# Patient Record
Sex: Female | Born: 1990 | Race: Black or African American | Hispanic: No | Marital: Single | Smoking: Never smoker
Health system: Southern US, Community
[De-identification: ages and names within clinical notes are randomized; demographics above are authoritative.]

## PROBLEM LIST (undated history)

## (undated) ENCOUNTER — Inpatient Hospital Stay (HOSPITAL_COMMUNITY): Payer: Self-pay

## (undated) DIAGNOSIS — F329 Major depressive disorder, single episode, unspecified: Secondary | ICD-10-CM

## (undated) DIAGNOSIS — J302 Other seasonal allergic rhinitis: Secondary | ICD-10-CM

## (undated) DIAGNOSIS — O139 Gestational [pregnancy-induced] hypertension without significant proteinuria, unspecified trimester: Secondary | ICD-10-CM

## (undated) DIAGNOSIS — F32A Depression, unspecified: Secondary | ICD-10-CM

## (undated) DIAGNOSIS — B009 Herpesviral infection, unspecified: Secondary | ICD-10-CM

## (undated) DIAGNOSIS — F419 Anxiety disorder, unspecified: Secondary | ICD-10-CM

## (undated) DIAGNOSIS — N96 Recurrent pregnancy loss: Secondary | ICD-10-CM

## (undated) DIAGNOSIS — Z6791 Unspecified blood type, Rh negative: Secondary | ICD-10-CM

## (undated) DIAGNOSIS — A749 Chlamydial infection, unspecified: Secondary | ICD-10-CM

## (undated) DIAGNOSIS — O09299 Supervision of pregnancy with other poor reproductive or obstetric history, unspecified trimester: Secondary | ICD-10-CM

## (undated) DIAGNOSIS — O26899 Other specified pregnancy related conditions, unspecified trimester: Secondary | ICD-10-CM

## (undated) HISTORY — DX: Unspecified blood type, rh negative: Z67.91

## (undated) HISTORY — DX: Other specified pregnancy related conditions, unspecified trimester: O26.899

## (undated) HISTORY — PX: IUD REMOVAL: SHX5392

## (undated) HISTORY — DX: Recurrent pregnancy loss: N96

## (undated) HISTORY — DX: Supervision of pregnancy with other poor reproductive or obstetric history, unspecified trimester: O09.299

---
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
## 2012-05-22 ENCOUNTER — Ambulatory Visit (INDEPENDENT_AMBULATORY_CARE_PROVIDER_SITE_OTHER): Payer: Managed Care, Other (non HMO) | Admitting: Family Medicine

## 2012-05-22 ENCOUNTER — Other Ambulatory Visit (HOSPITAL_COMMUNITY)
Admission: RE | Admit: 2012-05-22 | Discharge: 2012-05-22 | Disposition: A | Discharge status: Home / Self Care | Payer: Managed Care, Other (non HMO) | Source: Ambulatory Visit | Attending: Family Medicine | Admitting: Family Medicine

## 2012-05-22 ENCOUNTER — Encounter: Payer: Self-pay | Admitting: Family Medicine

## 2012-05-22 VITALS — BP 126/82 | HR 80 | Wt 173.0 lb

## 2012-05-22 DIAGNOSIS — A499 Bacterial infection, unspecified: Secondary | ICD-10-CM

## 2012-05-22 DIAGNOSIS — N898 Other specified noninflammatory disorders of vagina: Secondary | ICD-10-CM

## 2012-05-22 DIAGNOSIS — Z113 Encounter for screening for infections with a predominantly sexual mode of transmission: Secondary | ICD-10-CM | POA: Insufficient documentation

## 2012-05-22 DIAGNOSIS — B9689 Other specified bacterial agents as the cause of diseases classified elsewhere: Secondary | ICD-10-CM

## 2012-05-22 DIAGNOSIS — N760 Acute vaginitis: Secondary | ICD-10-CM

## 2012-05-22 LAB — HIV ANTIBODY (ROUTINE TESTING W REFLEX): HIV: NONREACTIVE

## 2012-05-22 LAB — POCT WET PREP (WET MOUNT)

## 2012-05-22 LAB — RPR

## 2012-05-22 MED ORDER — METRONIDAZOLE 0.75 % VA GEL: 1.0000 | Freq: Every day | VAGINAL | Status: AC

## 2012-05-22 NOTE — Progress Notes (Signed)
Subjective:     Patient ID: Julia Potter, female   DOB: 11/07/1990, 22 y.o.   MRN: [id number]  HPI Vagina discharge:This has been going on for a month,she has been using monistat which cleared discharge cleared for 2 wks and then discharged returned few days ago.No burning,there itching,no changes in urine.Recently changed partner,previous yeast infection. HIV test done 1 yr ago was negative.Hx of chlamydia about 4 yrs ago.  Past Medical History  Diagnosis Date  . ALLERGIC RHINITIS       Review of Systems  Respiratory: Negative.   Cardiovascular: Negative.   Gastrointestinal: Negative.   Genitourinary: Positive for vaginal discharge. Negative for dysuria, urgency, hematuria, vaginal bleeding, enuresis, genital sores and menstrual problem.  All other systems reviewed and are negative.   Filed Vitals:   05/22/12 0833  BP: 126/82  Pulse: 80  Weight: 173 lb (78.472 kg)       Objective:   Physical Exam  Nursing note and vitals reviewed. Constitutional: She appears well-developed. No distress.  Cardiovascular: Normal rate, regular rhythm and normal heart sounds.   No murmur heard. Pulmonary/Chest: Effort normal and breath sounds normal. No respiratory distress. She has no wheezes. She exhibits no tenderness.  Abdominal: Soft. Bowel sounds are normal. She exhibits no distension and no mass. There is no tenderness.  Genitourinary: Uterus normal. Pelvic exam was performed with patient supine. Cervix exhibits discharge. Cervix exhibits no motion tenderness. Right adnexum displays no mass and no tenderness. Left adnexum displays no mass and no tenderness. No tenderness around the vagina. Vaginal discharge found.         Assessment:     Vaginitis (BV): Wet prep positive clue cells and whiff STD screening     Plan:     1. Metrogel prescribed.     F/U prn.  2. STD counseling done.     GC/Chlamydia,HIV and RPR checked today.     I will call her with test result.

## 2012-05-22 NOTE — Patient Instructions (Signed)
Sexually Transmitted Disease  Sexually transmitted disease (STD) refers to any infection that is passed from person to person during sexual activity. This may happen by way of saliva, semen, blood, vaginal mucus, or urine. Common STDs include:   Gonorrhea.   Chlamydia.   Syphilis.   HIV/AIDS.   Genital herpes.   Hepatitis B and C.   Trichomonas.   Human papillomavirus (HPV).   Pubic lice.  CAUSES   An STD may be spread by bacteria, virus, or parasite. A person can get an STD by:   Sexual intercourse with an infected person.   Sharing sex toys with an infected person.   Sharing needles with an infected person.   Having intimate contact with the genitals, mouth, or rectal areas of an infected person.  SYMPTOMS   Some people may not have any symptoms, but they can still pass the infection to others. Different STDs have different symptoms. Symptoms include:   Painful or bloody urination.   Pain in the pelvis, abdomen, vagina, anus, throat, or eyes.   Skin rash, itching, irritation, growths, or sores (lesions). These usually occur in the genital or anal area.   Abnormal vaginal discharge.   Penile discharge in men.   Soft, flesh-colored skin growths in the genital or anal area.   Fever.   Pain or bleeding during sexual intercourse.   Swollen glands in the groin area.   Yellow skin and eyes (jaundice). This is seen with hepatitis.  DIAGNOSIS   To make a diagnosis, your caregiver may:   Take a medical history.   Perform a physical exam.   Take a specimen (culture) to be examined.   Examine a sample of discharge under a microscope.   Perform blood tests.   Perform a Pap test, if this applies.   Perform a colposcopy.   Perform a laparoscopy.  TREATMENT    Chlamydia, gonorrhea, trichomonas, and syphilis can be cured with antibiotic medicine.   Genital herpes, hepatitis, and HIV can be treated, but not cured, with prescribed medicines. The medicines will lessen the symptoms.   Genital warts  from HPV can be treated with medicine or by freezing, burning (electrocautery), or surgery. Warts may come back.   HPV is a virus and cannot be cured with medicine or surgery.However, abnormal areas may be followed very closely by your caregiver and may be removed from the cervix, vagina, or vulva through office procedures or surgery.  If your diagnosis is confirmed, your recent sexual partners need treatment. This is true even if they are symptom-free or have a negative culture or evaluation. They should not have sex until their caregiver says it is okay.  HOME CARE INSTRUCTIONS   All sexual partners should be informed, tested, and treated for all STDs.   Take your antibiotics as directed. Finish them even if you start to feel better.   Only take over-the-counter or prescription medicines for pain, discomfort, or fever as directed by your caregiver.   Rest.   Eat a balanced diet and drink enough fluids to keep your urine clear or pale yellow.   Do not have sex until treatment is completed and you have followed up with your caregiver. STDs should be checked after treatment.   Keep all follow-up appointments, Pap tests, and blood tests as directed by your caregiver.   Only use latex condoms and water-soluble lubricants during sexual activity. Do not use petroleum jelly or oils.   Avoid alcohol and illegal drugs.   Get vaccinated   for HPV and hepatitis. If you have not received these vaccines in the past, talk to your caregiver about whether one or both might be right for you.   Avoid risky sex practices that can break the skin.  The only way to avoid getting an STD is to avoid all sexual activity.Latex condoms and dental dams (for oral sex) will help lessen the risk of getting an STD, but will not completely eliminate the risk.  SEEK MEDICAL CARE IF:    You have a fever.   You have any new or worsening symptoms.  Document Released: 04/09/2002 Document Revised: 04/11/2011 Document Reviewed:  04/16/2010  [employer] Patient Information 2013 [employer].

## 2012-05-22 NOTE — Assessment & Plan Note (Signed)
Vaginitis (BV): Wet prep positive clue cells and whiff  Metrogel prescribed.     F/U prn.

## 2012-05-22 NOTE — Assessment & Plan Note (Signed)
  STD screening    STD counseling done.     GC/Chlamydia,HIV and RPR checked today.     I will call her with test result.

## 2012-05-24 ENCOUNTER — Encounter: Payer: Self-pay | Admitting: Family Medicine

## 2012-08-30 ENCOUNTER — Telehealth: Payer: Self-pay | Admitting: *Deleted

## 2012-08-30 ENCOUNTER — Emergency Department (HOSPITAL_COMMUNITY)
Admission: EM | Admit: 2012-08-30 | Discharge: 2012-08-30 | Disposition: A | Discharge status: Home / Self Care | Payer: Managed Care, Other (non HMO) | Source: Home / Self Care | Attending: Emergency Medicine | Admitting: Emergency Medicine

## 2012-08-30 ENCOUNTER — Encounter (HOSPITAL_COMMUNITY): Payer: Self-pay | Admitting: Emergency Medicine

## 2012-08-30 ENCOUNTER — Other Ambulatory Visit (HOSPITAL_COMMUNITY)
Admission: RE | Admit: 2012-08-30 | Discharge: 2012-08-30 | Disposition: A | Discharge status: Home / Self Care | Payer: Managed Care, Other (non HMO) | Source: Ambulatory Visit | Attending: Emergency Medicine | Admitting: Emergency Medicine

## 2012-08-30 ENCOUNTER — Ambulatory Visit: Payer: Managed Care, Other (non HMO) | Admitting: Family Medicine

## 2012-08-30 DIAGNOSIS — Z113 Encounter for screening for infections with a predominantly sexual mode of transmission: Secondary | ICD-10-CM | POA: Insufficient documentation

## 2012-08-30 DIAGNOSIS — N760 Acute vaginitis: Secondary | ICD-10-CM

## 2012-08-30 LAB — POCT URINALYSIS DIP (DEVICE)
Bilirubin Urine: NEGATIVE
Glucose, UA: NEGATIVE mg/dL
Nitrite: NEGATIVE
Urobilinogen, UA: 0.2 mg/dL (ref 0.0–1.0)

## 2012-08-30 LAB — POCT PREGNANCY, URINE: Preg Test, Ur: NEGATIVE

## 2012-08-30 MED ORDER — FLUCONAZOLE 150 MG PO TABS: 150.0000 mg | ORAL_TABLET | Freq: Once | ORAL | Status: DC

## 2012-08-30 MED ORDER — METRONIDAZOLE 500 MG PO TABS: 500.0000 mg | ORAL_TABLET | Freq: Two times a day (BID) | ORAL | Status: DC

## 2012-08-30 NOTE — ED Notes (Signed)
Reports vaginal discharge since Monday, described as milky white, no odor.  Diagnosed last month with bv and treated with cream, not confident cream took care of issue.

## 2012-08-30 NOTE — Telephone Encounter (Signed)
Message left for pt that appointment has been canceled due to MD emergency please call back to reschedule. Wyatt Haste, RN-BSN

## 2012-08-30 NOTE — ED Provider Notes (Signed)
Chief Complaint:   Chief Complaint  Patient presents with  . Vaginal Discharge    History of Present Illness:   Julia Potter is a 22 year old female who has had a four-day history of vaginal discharge and itching. The discharge is milky white in. She's had some irritation of the vulva and vagina. No ulcerations, blisters, or sores. She [facility] and was diagnosed with bacterial vaginosis. She was given MetroGel cream. She's concerned that this might be recurrence of bacterial vaginosis. She denies any odor, pelvic pain, fever, chills, nausea, vomiting, or urinary symptoms. She has a Mirena IUD and does not have menses.  Review of Systems:  Other than noted above, the patient denies any of the following symptoms: Systemic:  No fever, chills, sweats, or weight loss. GI:  No abdominal pain, nausea, anorexia, vomiting, diarrhea, constipation, melena or hematochezia. GU:  No dysuria, frequency, urgency, hematuria, vaginal discharge, itching, or abnormal vaginal bleeding. Skin:  No rash or itching.  PMFSH:  Past medical history, family history, social history, meds, and allergies were reviewed.   Physical Exam:   Vital signs:  BP 128/74  Pulse 105  Temp(Src) 99.4 F (37.4 C) (Oral)  Resp 16  SpO2 100% General:  Alert, oriented and in no distress. Lungs:  Breath sounds clear and equal bilaterally.  No wheezes, rales or rhonchi. Heart:  Regular rhythm.  No gallops or murmers. Abdomen:  Soft, flat and non-distended.  No organomegaly or mass.  No tenderness, guarding or rebound.  Bowel sounds normally active. Pelvic exam:  She has erythema and swelling of the vulvar and vaginal and cervical mucosa. There is a very slight whitish discharge. She has vulvar and vaginal pain on pelvic exam but no cervical motion pain. Uterus is normal in size and shape and nontender. No adnexal masses or tenderness. Skin:  Clear, warm and dry.  Labs:   Results for orders  placed during the hospital encounter of 08/30/12  POCT URINALYSIS DIP (DEVICE)      Result Value Range   Glucose, UA NEGATIVE  NEGATIVE mg/dL   Bilirubin Urine NEGATIVE  NEGATIVE   Ketones, ur NEGATIVE  NEGATIVE mg/dL   Specific Gravity, Urine >=1.030  1.005 - 1.030   Hgb urine dipstick MODERATE (*) NEGATIVE   pH 5.5  5.0 - 8.0   Protein, ur 30 (*) NEGATIVE mg/dL   Urobilinogen, UA 0.2  0.0 - 1.0 mg/dL   Nitrite NEGATIVE  NEGATIVE   Leukocytes, UA LARGE (*) NEGATIVE  POCT PREGNANCY, URINE      Result Value Range   Preg Test, Ur NEGATIVE  NEGATIVE    DNA probes for gonorrhea, Chlamydia, Trichomonas, Candida, and Gardnerella obtained.  Assessment:  The encounter diagnosis was Vaginitis.  This appears to be either Candida or Trichomonas. We'll see what her DNA probe shows. In the meantime will treat with both Flagyl and Diflucan.  Plan:   1.  The following meds were prescribed:   Discharge Medication List as of 08/30/2012  2:30 PM    START taking these medications   Details  fluconazole (DIFLUCAN) 150 MG tablet Take 1 tablet (150 mg total) by mouth once., Starting 08/30/2012, Normal    metroNIDAZOLE (FLAGYL) 500 MG tablet Take 1 tablet (500 mg total) by mouth 2 (two) times daily., Starting 08/30/2012, Until Discontinued, Normal       2.  The patient was instructed in symptomatic care and handouts were given. 3.  The patient  was told to return if becoming worse in any way, if no better in 3 or 4 days, and given some red flag symptoms such as worsening pain that would indicate earlier return. 4.  Follow up here if necessary.    Reuben Likes, MD 08/30/12 [id number]

## 2012-09-01 NOTE — ED Notes (Signed)
Returned call to patient, and after verifying ID, discussed her negative findings

## 2012-09-03 ENCOUNTER — Other Ambulatory Visit: Payer: Self-pay | Admitting: Family Medicine

## 2012-09-03 ENCOUNTER — Encounter: Payer: Self-pay | Admitting: Family Medicine

## 2012-09-03 ENCOUNTER — Ambulatory Visit (INDEPENDENT_AMBULATORY_CARE_PROVIDER_SITE_OTHER): Payer: Managed Care, Other (non HMO) | Admitting: Family Medicine

## 2012-09-03 VITALS — BP 134/82 | HR 115 | Ht 62.0 in | Wt 169.0 lb

## 2012-09-03 DIAGNOSIS — A6000 Herpesviral infection of urogenital system, unspecified: Secondary | ICD-10-CM | POA: Insufficient documentation

## 2012-09-03 MED ORDER — HYDROCODONE-ACETAMINOPHEN 10-325 MG PO TABS: 1.0000 | ORAL_TABLET | Freq: Three times a day (TID) | ORAL | Status: DC | PRN

## 2012-09-03 MED ORDER — VALACYCLOVIR HCL 1 G PO TABS: 1000.0000 mg | ORAL_TABLET | Freq: Two times a day (BID) | ORAL | Status: DC

## 2012-09-03 NOTE — Assessment & Plan Note (Addendum)
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 Will treat with Valtrex x 10 days.  PRN Vicodin given for pain.

## 2012-09-03 NOTE — Patient Instructions (Addendum)
Please take the medication as prescribed.  Genital Herpes Genital herpes is a sexually transmitted disease. This means that it is a disease passed by having sex with an infected person. There is no cure for genital herpes. The time between attacks can be months to years. The virus may live in a person but produce no problems (symptoms). This infection can be passed to a baby as it travels down the birth canal (vagina). In a newborn, this can cause central nervous system damage, eye damage, or even death. The virus that causes genital herpes is usually HSV-2 virus. The virus that causes oral herpes is usually HSV-1. The diagnosis (learning what is wrong) is made through culture results. SYMPTOMS  Usually symptoms of pain and itching begin a few days to a week after contact. It first appears as small blisters that progress to small painful ulcers which then scab over and heal after several days. It affects the outer genitalia, birth canal, cervix, penis, anal area, buttocks, and thighs. HOME CARE INSTRUCTIONS   Keep ulcerated areas dry and clean.  Take medications as directed. Antiviral medications can speed up healing. They will not prevent recurrences or cure this infection. These medications can also be taken for suppression if there are frequent recurrences.  While the infection is active, it is contagious. Avoid all sexual contact during active infections.  Condoms may help prevent spread of the herpes virus.  Practice safe sex.  Wash your hands thoroughly after touching the genital area.  Avoid touching your eyes after touching your genital area.  Inform your caregiver if you have had genital herpes and become pregnant. It is your responsibility to insure a safe outcome for your baby in this pregnancy.  Only take over-the-counter or prescription medicines for pain, discomfort, or fever as directed by your caregiver. SEEK MEDICAL CARE IF:   You have a recurrence of this  infection.  You do not respond to medications and are not improving.  You have new sources of pain or discharge which have changed from the original infection.  You have an oral temperature above 102 F (38.9 C).  You develop abdominal pain.  You develop eye pain or signs of eye infection. Document Released: 01/15/2000 Document Revised: 04/11/2011 Document Reviewed: 02/04/2009 [facility] Patient Information 2014 [place], [place].

## 2012-09-03 NOTE — Progress Notes (Signed)
Subjective:     Patient ID: Julia Potter, female   DOB: 1990-02-06, 22 y.o.   MRN: [id number]  HPI 22 year old female presents to the clinic today with complaints of vaginal "rash." Patient reports that she was seen at Urgent Care on 7/31 and was diagnosed with a Yeast infection and BV.  She was treated with Flagyl and Diflucan.  Subsequently, she developed a painful vaginal rash and ulcers.  This began ~ 2 days ago.  She reports severe genital pain (7/10 in severity).  She also reports associated dysuria.  She denies fevers, chills, abdominal pain.  She has had no change in sexual partner.  Review of Systems Per HPI     Objective:   Physical Exam Filed Vitals:   09/03/12 0925  BP: 134/82  Pulse: 115   General: appears in pain, but in NAD. Genital exam:          External: multiple erythematous areas of ulceration noted around the labia.  A few vesicles noted.            HSV swab obtained.     Assessment:     See Problem list     Plan:

## 2012-09-06 ENCOUNTER — Encounter: Payer: Self-pay | Admitting: Family Medicine

## 2012-09-13 ENCOUNTER — Other Ambulatory Visit: Payer: Self-pay | Admitting: Family Medicine

## 2012-09-14 ENCOUNTER — Other Ambulatory Visit: Payer: Self-pay | Admitting: *Deleted

## 2012-09-17 NOTE — Telephone Encounter (Signed)
Patient should not need refill on either medication: Flagyl or Norco.  Patient has recent completed a treatment course for BV and Genital herpes.

## 2012-09-18 ENCOUNTER — Other Ambulatory Visit: Payer: Self-pay | Admitting: *Deleted

## 2012-09-20 ENCOUNTER — Telehealth: Payer: Self-pay | Admitting: Family Medicine

## 2012-09-20 NOTE — Telephone Encounter (Signed)
Dr Adriana Simas spoke to patient and explained that we usually don't keep refill antibiotic or pain med if she has no hx of chronic pain,She states she's still having symptoms and med prescribed haven't help much,I instructed her to schedule a follow up with you and at that time If needed you would refill medication .she voiced understandings. Jolane Bankhead, Virgel Bouquet

## 2012-09-20 NOTE — Telephone Encounter (Signed)
Pt is calling because all three of her refill request are still not at the pharmacy. [facility] uses [facility] and she called them 8/21 and they said that they have not received in refill from [place]. Valtrex, metronidazole, and hydrocodone. JW

## 2012-09-21 ENCOUNTER — Telehealth: Payer: Self-pay | Admitting: Family Medicine

## 2012-09-24 ENCOUNTER — Ambulatory Visit (INDEPENDENT_AMBULATORY_CARE_PROVIDER_SITE_OTHER): Payer: Managed Care, Other (non HMO) | Admitting: Family Medicine

## 2012-09-24 ENCOUNTER — Encounter: Payer: Self-pay | Admitting: Family Medicine

## 2012-09-24 VITALS — BP 132/82 | HR 103 | Temp 99.0°F | Ht 62.0 in | Wt 173.0 lb

## 2012-09-24 DIAGNOSIS — N898 Other specified noninflammatory disorders of vagina: Secondary | ICD-10-CM | POA: Insufficient documentation

## 2012-09-24 DIAGNOSIS — N760 Acute vaginitis: Secondary | ICD-10-CM

## 2012-09-24 DIAGNOSIS — A6000 Herpesviral infection of urogenital system, unspecified: Secondary | ICD-10-CM

## 2012-09-24 LAB — POCT WET PREP (WET MOUNT)

## 2012-09-24 MED ORDER — METRONIDAZOLE 500 MG PO TABS: 500.0000 mg | ORAL_TABLET | Freq: Two times a day (BID) | ORAL | Status: DC

## 2012-09-24 MED ORDER — VALACYCLOVIR HCL 500 MG PO TABS: 500.0000 mg | ORAL_TABLET | Freq: Every day | ORAL | Status: DC

## 2012-09-24 NOTE — Assessment & Plan Note (Addendum)
Wet prep negative for BV, Trich, and Yeast. However, given symptoms and discharge on exam (as well as incomplete treatment) will treat empirically with Flagyl. Patient to return if symptoms do not resolve.

## 2012-09-24 NOTE — Assessment & Plan Note (Signed)
Patient desires suppressive therapy.  Rx sent suppressive daily Valtrex.

## 2012-09-24 NOTE — Progress Notes (Signed)
Subjective:     Patient ID: Synetta Fail, female   DOB: 30-Nov-1990, 22 y.o.   MRN: [id number]  HPI 22 year old female presents with complaints of vaginal itching and discharge.  1) Vaginal itching and discharge. - Patient recently treated for BV and Yeast infection on 7/31. - Patient also recently treated for initial herpes outbreak. - Today, she reports several day history of vaginal itching and discharge.  Discharge described as white/creamy. - No associated abdominal pain, n/v/d, dysuria, fevers, or chills. - Of noted, patient did not complete treatment for BV.  Review of Systems Per HPI    Objective:   Physical Exam Filed Vitals:   09/24/12 1415  BP: 132/82  Pulse: 103  Temp: 99 F (37.2 C)    General: well appearing female in NAD. Pelvic Exam:        External: normal female genitalia; small vesicular lesion noted on right labia majora. White  discharge noted around introitus        Vagina: white discharge noted.        Samples for Wet prep obtained     Assessment:     See Problem list    Plan:

## 2012-09-24 NOTE — Patient Instructions (Addendum)
It was nice seeing you today.  Given your symptoms I am going to treat your empirically for BV, although the wet prep was negative.  Please be sure to complete the treatment.  I am also starting you on suppressive therapy with daily Valtrex.  Please follow up if your symptoms do not improve.

## 2012-09-27 NOTE — Telephone Encounter (Signed)
error 

## 2012-12-24 ENCOUNTER — Telehealth: Payer: Self-pay | Admitting: Family Medicine

## 2012-12-24 MED ORDER — METRONIDAZOLE 500 MG PO TABS: 500.0000 mg | ORAL_TABLET | Freq: Two times a day (BID) | ORAL | Status: DC

## 2012-12-24 NOTE — Telephone Encounter (Signed)
Rx refilled. If the patient is continuing to have trouble with discharge, she should be re-evaluated.

## 2012-12-24 NOTE — Telephone Encounter (Signed)
Pt is requesting a refill on Flagyl sent to her pharmacy on file. jw

## 2012-12-24 NOTE — Telephone Encounter (Signed)
Please advise.  Julia Potter  

## 2013-01-18 ENCOUNTER — Other Ambulatory Visit: Payer: Self-pay | Admitting: Family Medicine

## 2013-01-23 ENCOUNTER — Other Ambulatory Visit: Payer: Self-pay | Admitting: Family Medicine

## 2013-05-16 ENCOUNTER — Other Ambulatory Visit: Payer: Self-pay | Admitting: Family Medicine

## 2013-05-17 ENCOUNTER — Other Ambulatory Visit: Payer: Self-pay | Admitting: Family Medicine

## 2013-05-17 MED ORDER — METRONIDAZOLE 500 MG PO TABS: 500.0000 mg | ORAL_TABLET | Freq: Two times a day (BID) | ORAL | Status: DC

## 2013-08-19 ENCOUNTER — Encounter (HOSPITAL_COMMUNITY): Payer: Self-pay | Admitting: Emergency Medicine

## 2013-08-19 ENCOUNTER — Emergency Department (INDEPENDENT_AMBULATORY_CARE_PROVIDER_SITE_OTHER)
Admission: EM | Admit: 2013-08-19 | Discharge: 2013-08-19 | Disposition: A | Discharge status: Home / Self Care | Payer: Self-pay | Source: Home / Self Care | Attending: Family Medicine | Admitting: Family Medicine

## 2013-08-19 DIAGNOSIS — M549 Dorsalgia, unspecified: Secondary | ICD-10-CM

## 2013-08-19 MED ORDER — CYCLOBENZAPRINE HCL 5 MG PO TABS: 5.0000 mg | ORAL_TABLET | Freq: Three times a day (TID) | ORAL | Status: DC | PRN
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
## 2013-08-29 ENCOUNTER — Encounter: Payer: Self-pay | Admitting: Family Medicine

## 2013-08-29 ENCOUNTER — Ambulatory Visit (INDEPENDENT_AMBULATORY_CARE_PROVIDER_SITE_OTHER): Payer: Managed Care, Other (non HMO) | Admitting: Family Medicine

## 2013-08-29 DIAGNOSIS — M549 Dorsalgia, unspecified: Secondary | ICD-10-CM

## 2013-08-29 MED ORDER — CYCLOBENZAPRINE HCL 5 MG PO TABS: 10.0000 mg | ORAL_TABLET | Freq: Three times a day (TID) | ORAL | Status: DC | PRN
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
## 2013-08-29 NOTE — Patient Instructions (Signed)
It was nice to see you today.  This will slowly improve.  Take tylenol and/or motrin as needed for pain.  Take the muscle relaxant up to 3 times daily (start with just once at night).  Follow up if you worsen or fail to improve.

## 2013-08-30 ENCOUNTER — Telehealth: Payer: Self-pay | Admitting: Family Medicine
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
## 2013-08-31 NOTE — Telephone Encounter (Signed)
Please print this for patient and notify her to pick it up.

## 2013-09-02 NOTE — Telephone Encounter (Signed)
Patient informed that copy of visit was left up front.she voiced great appreciation.[name], [name]

## 2013-10-08 ENCOUNTER — Inpatient Hospital Stay (HOSPITAL_COMMUNITY)
Admission: AD | Admit: 2013-10-08 | Discharge: 2013-10-08 | Disposition: A | Discharge status: Home / Self Care | Payer: Managed Care, Other (non HMO) | Source: Ambulatory Visit | Attending: Family Medicine | Admitting: Family Medicine

## 2013-10-08 ENCOUNTER — Encounter (HOSPITAL_COMMUNITY): Payer: Self-pay | Admitting: *Deleted

## 2013-10-08 DIAGNOSIS — R109 Unspecified abdominal pain: Secondary | ICD-10-CM | POA: Insufficient documentation

## 2013-10-08 DIAGNOSIS — M549 Dorsalgia, unspecified: Secondary | ICD-10-CM

## 2013-10-08 HISTORY — DX: Anxiety disorder, unspecified: F41.9

## 2013-10-08 HISTORY — DX: Herpesviral infection, unspecified: B00.9

## 2013-10-08 LAB — URINE MICROSCOPIC-ADD ON

## 2013-10-08 LAB — URINALYSIS, ROUTINE W REFLEX MICROSCOPIC
Bilirubin Urine: NEGATIVE
GLUCOSE, UA: NEGATIVE mg/dL
Hgb urine dipstick: NEGATIVE
Ketones, ur: NEGATIVE mg/dL
Nitrite: NEGATIVE
PROTEIN: NEGATIVE mg/dL
Specific Gravity, Urine: 1.015 (ref 1.005–1.030)
Urobilinogen, UA: 0.2 mg/dL (ref 0.0–1.0)
pH: 7 (ref 5.0–8.0)

## 2013-10-08 LAB — WET PREP, GENITAL
Trich, Wet Prep: NONE SEEN
Yeast Wet Prep HPF POC: NONE SEEN

## 2013-10-08 LAB — POCT PREGNANCY, URINE: PREG TEST UR: NEGATIVE

## 2013-10-08 MED ORDER — METRONIDAZOLE 500 MG PO TABS: 500.0000 mg | ORAL_TABLET | Freq: Two times a day (BID) | ORAL | Status: DC

## 2013-10-08 NOTE — MAU Note (Addendum)
Abdominal pain off and on for several months. Has a Mirena placed by Dr. Karrie Meres 3 years ago. States she has a hx of BV. Wants to know what is going on. Pain is minimal today, states comes and goes like period cramps. Denies vaginal bleeding or D/C. Unable to void for urine sample.

## 2013-10-08 NOTE — MAU Provider Note (Signed)
Attestation of Attending Supervision of Obstetric Fellow: Evaluation and management procedures were performed by the Obstetric Fellow under my supervision and collaboration.  I have reviewed the Obstetric Fellow's note and chart, and I agree with the management and plan.  Jacob Stinson, DO Attending Physician Faculty Practice, [facility]  

## 2013-10-08 NOTE — MAU Provider Note (Signed)
  History     CSN: [id number]  Arrival date and time: 10/08/13 0740   First Provider Initiated Contact with Patient 10/08/13 [phone]      Chief Complaint  Patient presents with  . Abdominal Pain   HPI  Abdominal cramping: discomfort about 1/10, up to "hand full" of times at night, lasting few minutes, x several months, resolve spontaneously, has not had to take anything for pain.  Also feels she has more milky discharge than normal.  Mirena placed 2013.  Also has occasional spotting.  No severe abdominal pain, fevers/sweats/chills  Past Medical History  Diagnosis Date  . ALLERGIC RHINITIS   . Herpes   . Anxiety     Past Surgical History  Procedure Laterality Date  . Intrauterine device insertion  09/2010    History reviewed. No pertinent family history.  History  Substance Use Topics  . Smoking status: Never Smoker   . Smokeless tobacco: Never Used  . Alcohol Use: Yes     Comment: occas.    Allergies: No Known Allergies  Prescriptions prior to admission  Medication Sig Dispense Refill  . cyclobenzaprine (FLEXERIL) 5 MG tablet Take 2 tablets (10 mg total) by mouth 3 (three) times daily as needed for muscle spasms.  30 tablet  0  . diphenhydrAMINE (BENADRYL) 25 MG tablet Take 25 mg by mouth daily as needed for allergies.      . valACYclovir (VALTREX) 500 MG tablet Take 1 tablet (500 mg total) by mouth daily.  30 tablet  6  . levonorgestrel (MIRENA) 20 MCG/24HR IUD 1 each by Intrauterine route once. Pt had it placed August 2013        Review of Systems  Constitutional: Negative for fever and chills.  Respiratory: Negative for cough and shortness of breath.   Cardiovascular: Negative for chest pain and leg swelling.  Gastrointestinal: Negative for heartburn, nausea, vomiting and diarrhea.  Genitourinary: Negative for dysuria, urgency, frequency and hematuria.  Neurological:       No headache   Physical Exam   Blood pressure 121/80, pulse 104, temperature 98.8 F  (37.1 C), temperature source Oral, resp. rate 18, height  (1.575 m), weight 175 lb (79.379 kg).  Physical Exam  Constitutional: She is oriented to person, place, and time. She appears well-developed and well-nourished.  HENT:  Head: Normocephalic and atraumatic.  Eyes: Conjunctivae and EOM are normal.  Neck: Normal range of motion.  Cardiovascular: Normal rate.   Respiratory: Effort normal. No respiratory distress.  GI: Soft. She exhibits no distension. There is no tenderness.  Genitourinary: Uterus normal. Vaginal discharge found.  No cervical motion tenderness, no adnexal tenderness, strings easily visualized from os  Musculoskeletal: Normal range of motion. She exhibits no edema.  Neurological: She is alert and oriented to person, place, and time.  Skin: Skin is warm and dry. No erythema.    MAU Course  Procedures  MDM UA Wet prep G/C  Assessment and Plan  STD testing, no need for ultrasound at this time given severity of pain/discomfort.  If worsens will need imaging, however as strings easily visualized imaging deferred.  Chantee Cerino ROCIO 10/08/2013, 9:24 AM

## 2013-10-08 NOTE — Discharge Instructions (Signed)
Bacterial Vaginosis Bacterial vaginosis is an infection of the vagina. It happens when too many of certain germs (bacteria) grow in the vagina. HOME CARE  Take your medicine as told by your doctor.  Finish your medicine even if you start to feel better.  Do not have sex until you finish your medicine and are better.  Tell your sex partner that you have an infection. They should see their doctor for treatment.  Practice safe sex. Use condoms. Have only one sex partner. GET HELP IF:  You are not getting better after 3 days of treatment.  You have more grey fluid (discharge) coming from your vagina than before.  You have more pain than before.  You have a fever. MAKE SURE YOU:   Understand these instructions.  Will watch your condition.  Will get help right away if you are not doing well or get worse. Document Released: 10/27/2007 Document Revised: 11/07/2012 Document Reviewed: 08/29/2012 ExitCare Patient Information 2015 [employer]. This information is not intended to replace advice given to you by your health care provider. Make sure you discuss any questions you have with your health care provider.  

## 2013-10-09 LAB — GC/CHLAMYDIA PROBE AMP
CT Probe RNA: NEGATIVE
GC Probe RNA: NEGATIVE

## 2013-11-01 ENCOUNTER — Telehealth: Payer: Self-pay | Admitting: Psychology

## 2013-11-01 ENCOUNTER — Encounter: Payer: Self-pay | Admitting: Family Medicine

## 2013-11-01 ENCOUNTER — Ambulatory Visit (INDEPENDENT_AMBULATORY_CARE_PROVIDER_SITE_OTHER): Payer: Self-pay | Admitting: Family Medicine

## 2013-11-01 VITALS — BP 112/72 | HR 102 | Temp 98.2°F | Ht 62.0 in | Wt 182.0 lb

## 2013-11-01 DIAGNOSIS — F4322 Adjustment disorder with anxiety: Secondary | ICD-10-CM

## 2013-11-01 DIAGNOSIS — F99 Mental disorder, not otherwise specified: Secondary | ICD-10-CM | POA: Insufficient documentation

## 2013-11-01 MED ORDER — CITALOPRAM HYDROBROMIDE 20 MG PO TABS: 20.0000 mg | ORAL_TABLET | Freq: Every day | ORAL | Status: DC
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
## 2013-11-01 NOTE — Patient Instructions (Signed)
It was nice to see you today.  Below is what we have discussed:  1) Back pain - Exam is reassuring. - Use tylenol or ibuprofen as needed.  2) Anxiety - Please take the medication as prescribed. - Please call Dr. Pascal LuxKane and schedule an appointment.  Follow up in ~ 4-6 weeks.

## 2013-11-01 NOTE — Telephone Encounter (Signed)
Julia Potter called to request a [facility] appointment.  I am not taking new patients right now.  She does not have insurance currently.  Recommended [employer] [facility] and gave her the contact person and phone number.  I will let her PCP know.

## 2013-12-02 ENCOUNTER — Encounter: Payer: Self-pay | Admitting: Family Medicine

## 2013-12-11 ENCOUNTER — Other Ambulatory Visit: Payer: Self-pay | Admitting: Family Medicine

## 2014-03-05 ENCOUNTER — Ambulatory Visit: Payer: Self-pay | Admitting: Family Medicine

## 2014-03-11 ENCOUNTER — Emergency Department (HOSPITAL_COMMUNITY)
Admission: EM | Admit: 2014-03-11 | Discharge: 2014-03-11 | Disposition: A | Discharge status: Home / Self Care | Payer: Self-pay

## 2014-03-11 ENCOUNTER — Inpatient Hospital Stay (HOSPITAL_COMMUNITY)
Admission: AD | Admit: 2014-03-11 | Discharge: 2014-03-11 | Disposition: A | Discharge status: Home / Self Care | Payer: Self-pay | Source: Ambulatory Visit | Attending: Family Medicine | Admitting: Family Medicine

## 2014-03-11 ENCOUNTER — Encounter (HOSPITAL_COMMUNITY): Payer: Self-pay | Admitting: *Deleted

## 2014-03-11 DIAGNOSIS — N760 Acute vaginitis: Secondary | ICD-10-CM

## 2014-03-11 DIAGNOSIS — B9689 Other specified bacterial agents as the cause of diseases classified elsewhere: Secondary | ICD-10-CM | POA: Insufficient documentation

## 2014-03-11 DIAGNOSIS — A499 Bacterial infection, unspecified: Secondary | ICD-10-CM

## 2014-03-11 LAB — CBC WITH DIFFERENTIAL/PLATELET
Basophils Absolute: 0 10*3/uL (ref 0.0–0.1)
Basophils Relative: 0 % (ref 0–1)
EOS ABS: 0 10*3/uL (ref 0.0–0.7)
Eosinophils Relative: 1 % (ref 0–5)
HCT: 40.3 % (ref 36.0–46.0)
Hemoglobin: 13.9 g/dL (ref 12.0–15.0)
Lymphocytes Relative: 35 % (ref 12–46)
Lymphs Abs: 2.1 10*3/uL (ref 0.7–4.0)
MCH: 32.1 pg (ref 26.0–34.0)
MCHC: 34.5 g/dL (ref 30.0–36.0)
MCV: 93.1 fL (ref 78.0–100.0)
MONO ABS: 0.5 10*3/uL (ref 0.1–1.0)
MONOS PCT: 8 % (ref 3–12)
Neutro Abs: 3.4 10*3/uL (ref 1.7–7.7)
Neutrophils Relative %: 56 % (ref 43–77)
PLATELETS: 377 10*3/uL (ref 150–400)
RBC: 4.33 MIL/uL (ref 3.87–5.11)
RDW: 12.6 % (ref 11.5–15.5)
WBC: 6 10*3/uL (ref 4.0–10.5)

## 2014-03-11 LAB — URINE MICROSCOPIC-ADD ON

## 2014-03-11 LAB — URINALYSIS, ROUTINE W REFLEX MICROSCOPIC
BILIRUBIN URINE: NEGATIVE
Glucose, UA: NEGATIVE mg/dL
HGB URINE DIPSTICK: NEGATIVE
Ketones, ur: NEGATIVE mg/dL
NITRITE: NEGATIVE
PH: 6 (ref 5.0–8.0)
Protein, ur: NEGATIVE mg/dL
SPECIFIC GRAVITY, URINE: 1.025 (ref 1.005–1.030)
Urobilinogen, UA: 0.2 mg/dL (ref 0.0–1.0)

## 2014-03-11 LAB — POCT PREGNANCY, URINE: Preg Test, Ur: NEGATIVE

## 2014-03-11 LAB — WET PREP, GENITAL
TRICH WET PREP: NONE SEEN
YEAST WET PREP: NONE SEEN

## 2014-03-11 MED ORDER — METRONIDAZOLE 500 MG PO TABS: 500.0000 mg | ORAL_TABLET | Freq: Two times a day (BID) | ORAL | Status: DC

## 2014-03-11 NOTE — Discharge Instructions (Signed)
Bacterial Vaginosis Bacterial vaginosis is a vaginal infection that occurs when the normal balance of bacteria in the vagina is disrupted. It results from an overgrowth of certain bacteria. This is the most common vaginal infection in women of childbearing age. Treatment is important to prevent complications, especially in pregnant women, as it can cause a premature delivery. CAUSES  Bacterial vaginosis is caused by an increase in harmful bacteria that are normally present in smaller amounts in the vagina. Several different kinds of bacteria can cause bacterial vaginosis. However, the reason that the condition develops is not fully understood. RISK FACTORS Certain activities or behaviors can put you at an increased risk of developing bacterial vaginosis, including:  Having a new sex partner or multiple sex partners.  Douching.  Using an intrauterine device (IUD) for contraception. Women do not get bacterial vaginosis from toilet seats, bedding, swimming pools, or contact with objects around them. SIGNS AND SYMPTOMS  Some women with bacterial vaginosis have no signs or symptoms. Common symptoms include:  Grey vaginal discharge.  A fishlike odor with discharge, especially after sexual intercourse.  Itching or burning of the vagina and vulva.  Burning or pain with urination. DIAGNOSIS  Your health care provider will take a medical history and examine the vagina for signs of bacterial vaginosis. A sample of vaginal fluid may be taken. Your health care provider will look at this sample under a microscope to check for bacteria and abnormal cells. A vaginal pH test may also be done.  TREATMENT  Bacterial vaginosis may be treated with antibiotic medicines. These may be given in the form of a pill or a vaginal cream. A second round of antibiotics may be prescribed if the condition comes back after treatment.  HOME CARE INSTRUCTIONS   Only take over-the-counter or prescription medicines as  directed by your health care provider.  If antibiotic medicine was prescribed, take it as directed. Make sure you finish it even if you start to feel better.  Do not have sex until treatment is completed.  Tell all sexual partners that you have a vaginal infection. They should see their health care provider and be treated if they have problems, such as a mild rash or itching.  Practice safe sex by using condoms and only having one sex partner. SEEK MEDICAL CARE IF:   Your symptoms are not improving after 3 days of treatment.  You have increased discharge or pain.  You have a fever. MAKE SURE YOU:   Understand these instructions.  Will watch your condition.  Will get help right away if you are not doing well or get worse. FOR MORE INFORMATION  Centers for Disease Control and Prevention, Division of STD Prevention: www.cdc.gov/std American Sexual Health Association (ASHA): www.ashastd.org  Document Released: 01/17/2005 Document Revised: 11/07/2012 Document Reviewed: 08/29/2012 ExitCare Patient Information 2015 [employer]. This information is not intended to replace advice given to you by your health care provider. Make sure you discuss any questions you have with your health care provider.  

## 2014-03-11 NOTE — MAU Provider Note (Signed)
Chief Complaint: Abdominal Pain and Vaginal Discharge   First Provider Initiated Contact with Patient 03/11/14 1727     SUBJECTIVE HPI: Julia Potter is a 24 y.o. G1P0101 who presents to maternity admissions reporting vaginal discharge with slight odor and abdominal cramping x 2 weeks.  She has Mirena IUD x 3 years placed by Family Medicine and plans to have it removed later this year when she finishes school.  She denies vaginal bleeding, vaginal itching/burning, urinary symptoms, h/a, dizziness, n/v, or fever/chills.     Past Medical History  Diagnosis Date  . ALLERGIC RHINITIS   . Herpes   . Anxiety    Past Surgical History  Procedure Laterality Date  . Intrauterine device insertion  09/2010   History   Social History  . Marital Status: Single    Spouse Name: N/A    Number of Children: N/A  . Years of Education: N/A   Occupational History  . Not on file.   Social History Main Topics  . Smoking status: Never Smoker   . Smokeless tobacco: Never Used  . Alcohol Use: Yes     Comment: occas.  . Drug Use: No  . Sexual Activity: Yes    Birth Control/ Protection: IUD   Other Topics Concern  . Not on file   Social History Narrative   Lives with mom, sister and brother   Has one daughter born in [birth year] Julia Potter   Wants to go to law school   No current facility-administered medications on file prior to encounter.   Current Outpatient Prescriptions on File Prior to Encounter  Medication Sig Dispense Refill  . citalopram (CELEXA) 20 MG tablet Take 1 tablet (20 mg total) by mouth daily. 30 tablet 3  . diphenhydrAMINE (BENADRYL) 25 MG tablet Take 25 mg by mouth daily as needed for allergies.    Marland Kitchen. levonorgestrel (MIRENA) 20 MCG/24HR IUD 1 each by Intrauterine route once. Pt had it placed August 2013    . [DISCONTINUED] medroxyPROGESTERone (DEPO-PROVERA) 150 MG/ML injection Inject 150 mg into the muscle every 3 (three) months.       No Known Allergies  ROS: Pertinent  items in HPI  OBJECTIVE Blood pressure 119/78, pulse 91, resp. rate 18, height 5\' 2"  (1.575 m), weight 87.317 kg (192 lb 8 oz), SpO2 100 %. GENERAL: Well-developed, well-nourished female in no acute distress.  HEENT: Normocephalic HEART: normal rate RESP: normal effort ABDOMEN: Soft, non-tender EXTREMITIES: Nontender, no edema NEURO: Alert and oriented Pelvic exam: Cervix pink, visually closed, without lesion, large amount thin white discharge, vaginal walls and external genitalia normal Bimanual exam: Cervix 0/long/high, firm, anterior, neg CMT, uterus nontender, nonenlarged, adnexa without tenderness, enlargement, or mass  LAB RESULTS Results for orders placed or performed during the hospital encounter of 03/11/14 (from the past 24 hour(s))  Urinalysis, Routine w reflex microscopic     Status: Abnormal   Collection Time: 03/11/14  4:47 PM  Result Value Ref Range   Color, Urine YELLOW YELLOW   APPearance HAZY (A) CLEAR   Specific Gravity, Urine 1.025 1.005 - 1.030   pH 6.0 5.0 - 8.0   Glucose, UA NEGATIVE NEGATIVE mg/dL   Hgb urine dipstick NEGATIVE NEGATIVE   Bilirubin Urine NEGATIVE NEGATIVE   Ketones, ur NEGATIVE NEGATIVE mg/dL   Protein, ur NEGATIVE NEGATIVE mg/dL   Urobilinogen, UA 0.2 0.0 - 1.0 mg/dL   Nitrite NEGATIVE NEGATIVE   Leukocytes, UA TRACE (A) NEGATIVE  Urine microscopic-add on     Status: Abnormal  Collection Time: 03/11/14  4:47 PM  Result Value Ref Range   Squamous Epithelial / LPF MANY (A) RARE   WBC, UA 0-2 <3 WBC/hpf   RBC / HPF 3-6 <3 RBC/hpf   Bacteria, UA MANY (A) RARE  CBC with Differential/Platelet     Status: None   Collection Time: 03/11/14  4:48 PM  Result Value Ref Range   WBC 6.0 4.0 - 10.5 K/uL   RBC 4.33 3.87 - 5.11 MIL/uL   Hemoglobin 13.9 12.0 - 15.0 g/dL   HCT 16.1 09.6 - 04.5 %   MCV 93.1 78.0 - 100.0 fL   MCH 32.1 26.0 - 34.0 pg   MCHC 34.5 30.0 - 36.0 g/dL   RDW 40.9 81.1 - 91.4 %   Platelets 377 150 - 400 K/uL   Neutrophils  Relative % 56 43 - 77 %   Neutro Abs 3.4 1.7 - 7.7 K/uL   Lymphocytes Relative 35 12 - 46 %   Lymphs Abs 2.1 0.7 - 4.0 K/uL   Monocytes Relative 8 3 - 12 %   Monocytes Absolute 0.5 0.1 - 1.0 K/uL   Eosinophils Relative 1 0 - 5 %   Eosinophils Absolute 0.0 0.0 - 0.7 K/uL   Basophils Relative 0 0 - 1 %   Basophils Absolute 0.0 0.0 - 0.1 K/uL  Pregnancy, urine POC     Status: None   Collection Time: 03/11/14  4:57 PM  Result Value Ref Range   Preg Test, Ur NEGATIVE NEGATIVE  Wet prep, genital     Status: Abnormal   Collection Time: 03/11/14  5:45 PM  Result Value Ref Range   Yeast Wet Prep HPF POC NONE SEEN NONE SEEN   Trich, Wet Prep NONE SEEN NONE SEEN   Clue Cells Wet Prep HPF POC MODERATE (A) NONE SEEN   WBC, Wet Prep HPF POC FEW (A) NONE SEEN   ASSESSMENT 1. BV (bacterial vaginosis)     PLAN Discharge home Flagyl 500 mg BID x 7 days Discussed use of probiotics including yogurt daily to prevent vaginal infections  Follow-up Information    [facility]   Why:  As needed   Contact information:   [address] [place] [address]       Follow up with [facility] [place] [facility].   Why:  As needed for emergencies   Contact information:   [address] [id number] mc [place] [address] [phone]      Sharen Counter Certified Nurse-Midwife 03/11/2014  7:29 PM

## 2014-03-11 NOTE — ED Notes (Signed)
Pt name has been called by clinical staff and pt access staff. No one has answered by that name. Pt will be discharged from system.

## 2014-03-11 NOTE — MAU Note (Signed)
Cramping in lower abd, started 2 wks ago.  Also has a d/c- started about the same time.

## 2014-03-12 ENCOUNTER — Ambulatory Visit (INDEPENDENT_AMBULATORY_CARE_PROVIDER_SITE_OTHER): Payer: Self-pay | Admitting: Family Medicine

## 2014-03-12 ENCOUNTER — Encounter: Payer: Self-pay | Admitting: Family Medicine

## 2014-03-12 VITALS — BP 123/82 | HR 84 | Temp 98.3°F | Ht 62.0 in | Wt 193.0 lb

## 2014-03-12 DIAGNOSIS — F99 Mental disorder, not otherwise specified: Secondary | ICD-10-CM

## 2014-03-12 LAB — HIV ANTIBODY (ROUTINE TESTING W REFLEX): HIV Screen 4th Generation wRfx: NONREACTIVE

## 2014-03-12 LAB — GC/CHLAMYDIA PROBE AMP (~~LOC~~) NOT AT ARMC
Chlamydia: NEGATIVE
NEISSERIA GONORRHEA: NEGATIVE

## 2014-03-12 LAB — RPR: RPR: NONREACTIVE

## 2014-03-12 MED ORDER — ARIPIPRAZOLE 5 MG PO TABS: 5.0000 mg | ORAL_TABLET | Freq: Every day | ORAL | Status: DC

## 2014-03-12 NOTE — Assessment & Plan Note (Signed)
Unclear diagnosis at this time.  History is very difficult to attain/discern.  Patient appears anxious and endorses symptoms of depression (PHQ - 9 = 24).  However, she also has other concerning findings (lack of eye contact, very flat affect, prior reports that "people are out to get me", reported paranoia. In light of the above will try abilify and have patient contact [facility] for an appointment.

## 2014-03-12 NOTE — Patient Instructions (Signed)
It was nice to see you today.  I would like you to see Psychiatry [facility]  - Address: [address], [place], [place] [address]; [phone]  Follow up with me in 3-6 months.  If you worsen or develop suicidal ideation please go to the ED.
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
## 2014-03-14 ENCOUNTER — Telehealth: Payer: Self-pay | Admitting: Family Medicine

## 2014-03-14 MED ORDER — VENLAFAXINE HCL 37.5 MG PO TABS: 37.5000 mg | ORAL_TABLET | Freq: Two times a day (BID) | ORAL | Status: DC

## 2014-03-14 NOTE — Telephone Encounter (Signed)
Pt cannot afford Abilify. Requesting an alternative. Please advise.

## 2014-03-14 NOTE — Telephone Encounter (Signed)
Rx for Effexor sent. Per Dr. Raymondo BandKoval and the formulary, [facility]. If patient would like me to send it there, please let me know?

## 2014-03-18 NOTE — Telephone Encounter (Signed)
Spoke with patient and informed her [facility], but if that is too expensive she will call back

## 2014-07-11 ENCOUNTER — Ambulatory Visit (INDEPENDENT_AMBULATORY_CARE_PROVIDER_SITE_OTHER): Payer: Self-pay | Admitting: *Deleted

## 2014-07-11 DIAGNOSIS — Z111 Encounter for screening for respiratory tuberculosis: Secondary | ICD-10-CM

## 2014-07-11 NOTE — Progress Notes (Signed)
   PPD placed Left Forearm.  Pt to return 07/14/14 for reading.  Pt tolerated intradermal injection. Clovis Pu, RN

## 2014-07-14 ENCOUNTER — Ambulatory Visit (INDEPENDENT_AMBULATORY_CARE_PROVIDER_SITE_OTHER): Payer: Self-pay | Admitting: *Deleted

## 2014-07-14 DIAGNOSIS — Z111 Encounter for screening for respiratory tuberculosis: Secondary | ICD-10-CM

## 2014-07-14 DIAGNOSIS — Z7689 Persons encountering health services in other specified circumstances: Secondary | ICD-10-CM

## 2014-07-14 LAB — TB SKIN TEST
Induration: 0 mm
TB SKIN TEST: NEGATIVE

## 2014-07-14 NOTE — Progress Notes (Signed)
   PPD Reading Note PPD read and results entered in EpicCare. Result: 0 mm induration. Interpretation: Negative If test not read within 48-72 hours of initial placement, patient advised to repeat in other arm 1-3 weeks after this test. Allergic reaction: no  Martin, Tamika L, RN  

## 2014-08-11 ENCOUNTER — Encounter: Payer: Self-pay | Admitting: Internal Medicine

## 2014-08-11 ENCOUNTER — Ambulatory Visit (INDEPENDENT_AMBULATORY_CARE_PROVIDER_SITE_OTHER): Payer: Self-pay | Admitting: Internal Medicine

## 2014-08-11 ENCOUNTER — Other Ambulatory Visit (HOSPITAL_COMMUNITY)
Admission: RE | Admit: 2014-08-11 | Discharge: 2014-08-11 | Disposition: A | Discharge status: Home / Self Care | Payer: Self-pay | Source: Ambulatory Visit | Attending: Family Medicine | Admitting: Family Medicine

## 2014-08-11 VITALS — BP 134/72 | HR 88 | Temp 98.0°F | Wt 200.0 lb

## 2014-08-11 DIAGNOSIS — N898 Other specified noninflammatory disorders of vagina: Secondary | ICD-10-CM | POA: Insufficient documentation

## 2014-08-11 DIAGNOSIS — Z124 Encounter for screening for malignant neoplasm of cervix: Secondary | ICD-10-CM

## 2014-08-11 DIAGNOSIS — Z113 Encounter for screening for infections with a predominantly sexual mode of transmission: Secondary | ICD-10-CM | POA: Insufficient documentation

## 2014-08-11 DIAGNOSIS — Z01419 Encounter for gynecological examination (general) (routine) without abnormal findings: Secondary | ICD-10-CM | POA: Insufficient documentation

## 2014-08-11 LAB — POCT WET PREP (WET MOUNT): CLUE CELLS WET PREP WHIFF POC: POSITIVE

## 2014-08-11 MED ORDER — FLUCONAZOLE 150 MG PO TABS
150.0000 mg | ORAL_TABLET | Freq: Once | ORAL | Status: DC
Start: 2014-08-11 — End: 2014-10-10

## 2014-08-11 MED ORDER — METRONIDAZOLE 500 MG PO TABS: 500.0000 mg | ORAL_TABLET | Freq: Two times a day (BID) | ORAL | Status: DC

## 2014-08-11 NOTE — Progress Notes (Signed)
  Subjective  Julia Potter is a 24 y.o. G1P0101 here for Mirena IUD removal. Concerned about malodorous,cloudy vaginal discharge and recurrent BV and yeast infections over the past few years. Denies pruritis associated with discharge. Patient has never had a pap smear and is due for one today. Has recently changed sexual partners, so desires testing for STIs.    All Other ROS negative except noted in HPI.  Objective  BP 134/72 mmHg  Pulse 88  Temp(Src) 98 F (36.7 C)  Wt 200 lb (90.719 kg)  IUD Removal  Patient was in the dorsal lithotomy position, normal external genitalia was noted.  A speculum was placed in the patient's vagina, normal discharge was noted, no lesions. The multiparous cervix was visualized, no lesions, no abnormal discharge.  The strings of the IUD were grasped and pulled using ring forceps.  Patient tolerated the procedure well.    Speculum Exam: Ext genitalia: wnl; Vaginal discharge: copious, white, thick; Cervix: very anterior  Bimanual Exam: No Cervical motion tenderness; No Vaginal wall defects; Adnexa nontender  Assessment/Plan See Problem List Documentation   Marcy Sirenatherine Wallace, D.O. 08/11/2014, 3:35 PM PGY-1, [facility]

## 2014-08-11 NOTE — Patient Instructions (Addendum)
It was great seeing you today!   1. I will let you know the results of your Pap smear within the next week. If you have not heard from me, please call the clinic.  2. Read over the contraception options and let me know what you might be interested in trying at our next visit.    Please bring all your medications to every doctors visit  Sign up for My Chart to have easy access to your labs results, and communication with your Primary care physician.  Next Appointment  Please call to make an appointment with Dr. Earlene PlaterWallace in 3 months or sooner to discuss birth control options   I look forward to talking with you again at our next visit. If you have any questions or concerns before then, please call the clinic at [phone].  Take Care,   Dr. Marcy Sirenatherine Beretta Ginsberg   Contraception Choices Contraception (birth control) is the use of any methods or devices to prevent pregnancy. Below are some methods to help avoid pregnancy. HORMONAL METHODS   Contraceptive implant. This is a thin, plastic tube containing progesterone hormone. It does not contain estrogen hormone. Your health care provider inserts the tube in the inner part of the upper arm. The tube can remain in place for up to 3 years. After 3 years, the implant must be removed. The implant prevents the ovaries from releasing an egg (ovulation), thickens the cervical mucus to prevent sperm from entering the uterus, and thins the lining of the inside of the uterus.  Progesterone-only injections. These injections are given every 3 months by your health care provider to prevent pregnancy. This synthetic progesterone hormone stops the ovaries from releasing eggs. It also thickens cervical mucus and changes the uterine lining. This makes it harder for sperm to survive in the uterus.  Birth control pills. These pills contain estrogen and progesterone hormone. They work by preventing the ovaries from releasing eggs (ovulation). They also cause  the cervical mucus to thicken, preventing the sperm from entering the uterus. Birth control pills are prescribed by a health care provider.Birth control pills can also be used to treat heavy periods.  Minipill. This type of birth control pill contains only the progesterone hormone. They are taken every day of each month and must be prescribed by your health care provider.  Birth control patch. The patch contains hormones similar to those in birth control pills. It must be changed once a week and is prescribed by a health care provider.  Vaginal ring. The ring contains hormones similar to those in birth control pills. It is left in the vagina for 3 weeks, removed for 1 week, and then a new one is put back in place. The patient must be comfortable inserting and removing the ring from the vagina.A health care provider's prescription is necessary.  Emergency contraception. Emergency contraceptives prevent pregnancy after unprotected sexual intercourse. This pill can be taken right after sex or up to 5 days after unprotected sex. It is most effective the sooner you take the pills after having sexual intercourse. Most emergency contraceptive pills are available without a prescription. Check with your pharmacist. Do not use emergency contraception as your only form of birth control. BARRIER METHODS  1. Female condom. This is a thin sheath (latex or rubber) that is worn over the penis during sexual intercourse. It can be used with spermicide to increase effectiveness. 2. Female condom. This is a soft, loose-fitting sheath that is put into the vagina before  sexual intercourse. 3. Diaphragm. This is a soft, latex, dome-shaped barrier that must be fitted by a health care provider. It is inserted into the vagina, along with a spermicidal jelly. It is inserted before intercourse. The diaphragm should be left in the vagina for 6 to 8 hours after intercourse. 4. Cervical cap. This is a round, soft, latex or plastic  cup that fits over the cervix and must be fitted by a health care provider. The cap can be left in place for up to 48 hours after intercourse. 5. Sponge. This is a soft, circular piece of polyurethane foam. The sponge has spermicide in it. It is inserted into the vagina after wetting it and before sexual intercourse. 6. Spermicides. These are chemicals that kill or block sperm from entering the cervix and uterus. They come in the form of creams, jellies, suppositories, foam, or tablets. They do not require a prescription. They are inserted into the vagina with an applicator before having sexual intercourse. The process must be repeated every time you have sexual intercourse. INTRAUTERINE CONTRACEPTION  Intrauterine device (IUD). This is a T-shaped device that is put in a woman's uterus during a menstrual period to prevent pregnancy. There are 2 types:  Copper IUD. This type of IUD is wrapped in copper wire and is placed inside the uterus. Copper makes the uterus and fallopian tubes produce a fluid that kills sperm. It can stay in place for 10 years.  Hormone IUD. This type of IUD contains the hormone progestin (synthetic progesterone). The hormone thickens the cervical mucus and prevents sperm from entering the uterus, and it also thins the uterine lining to prevent implantation of a fertilized egg. The hormone can weaken or kill the sperm that get into the uterus. It can stay in place for 3-5 years, depending on which type of IUD is used. PERMANENT METHODS OF CONTRACEPTION  Female tubal ligation. This is when the woman's fallopian tubes are surgically sealed, tied, or blocked to prevent the egg from traveling to the uterus.  Hysteroscopic sterilization. This involves placing a small coil or insert into each fallopian tube. Your doctor uses a technique called hysteroscopy to do the procedure. The device causes scar tissue to form. This results in permanent blockage of the fallopian tubes, so the sperm  cannot fertilize the egg. It takes about 3 months after the procedure for the tubes to become blocked. You must use another form of birth control for these 3 months.  Female sterilization. This is when the female has the tubes that carry sperm tied off (vasectomy).This blocks sperm from entering the vagina during sexual intercourse. After the procedure, the man can still ejaculate fluid (semen). NATURAL PLANNING METHODS 3. Natural family planning. This is not having sexual intercourse or using a barrier method (condom, diaphragm, cervical cap) on days the woman could become pregnant. 4. Calendar method. This is keeping track of the length of each menstrual cycle and identifying when you are fertile. 5. Ovulation method. This is avoiding sexual intercourse during ovulation. 6. Symptothermal method. This is avoiding sexual intercourse during ovulation, using a thermometer and ovulation symptoms. 7. Post-ovulation method. This is timing sexual intercourse after you have ovulated. Regardless of which type or method of contraception you choose, it is important that you use condoms to protect against the transmission of sexually transmitted infections (STIs). Talk with your health care provider about which form of contraception is most appropriate for you. Document Released: 01/17/2005 Document Revised: 01/22/2013 Document Reviewed: 07/12/2012 ExitCare Patient  Information 2015 [employer]. This information is not intended to replace advice given to you by your health care provider. Make sure you discuss any questions you have with your health care provider.  

## 2014-08-11 NOTE — Assessment & Plan Note (Addendum)
Pt had IUD removed today because concerned that IUD was related to recurrent BV and yeast infections she has been having in the past few years. She is interested in learning more about available contraceptive options. We discussed some of the options today and she is going to think them through. As of right now, she is only planning on using condoms and is considering having another child in the next year or so.   Wet prep and GC/chlamydia ordered today. Pap smear with reflex HPV also performed.   Wet prep positive for yeast and BV. Treated accordingly.

## 2014-08-12 LAB — CERVICOVAGINAL ANCILLARY ONLY
Chlamydia: NEGATIVE
Neisseria Gonorrhea: NEGATIVE

## 2014-08-12 LAB — CYTOLOGY - PAP

## 2014-08-13 ENCOUNTER — Encounter: Payer: Self-pay | Admitting: Internal Medicine

## 2014-08-20 ENCOUNTER — Telehealth: Payer: Self-pay | Admitting: Internal Medicine

## 2014-08-20 NOTE — Telephone Encounter (Signed)
Hi,   I will need more information from the patient regarding her illness and why she didn't call[facility] if she was sick for an extended period of time. I saw her on 7/11 for a visit not related to an acute illness. I just don't feel comfortable excusing her for so many days without more info. Thanks!  Marcy Sirenatherine Wallace

## 2014-08-20 NOTE — Telephone Encounter (Signed)
Pt called and would like the doctor to write a note stating that she is sick and that she has been sick since 7/12 -7/20. Please call patient when this is done so she can come and pick this up. jw

## 2014-08-21 NOTE — Telephone Encounter (Signed)
Spoke with patient and she only needs the note for asking to excuse her tardiness with turning her school work in due to being sick.  States that she doesn't have insurance and this is why she didn't come in to be seen.  Informed patient that there isn't much we could do since she didn't call or have an appt to discuss this.  States that she "caught what her daughter had".  Patient asked about switching provider's so she can get this note written and I advised her that this wasn't a reason to switch provider's because the next provider would ask her to be seen in clinic also.  Patient states that she is feeling better now and doesn't need the appt now.  She continued to reiterate that she doesn't have insurance.  Advised her that I would send message to MD but couldn't guarantee a note would be provided.  Bina Veenstra,CMA

## 2014-08-22 NOTE — Telephone Encounter (Signed)
I'm sorry but I won't be able to write her a note. Please let her know that if she is feeling sick again, that she can always call to talk about it, but that we can't write a note after the fact if we aren't aware of her illness at the time of her being sick. Thanks!

## 2014-08-25 NOTE — Telephone Encounter (Signed)
Pt called back and doesn't understand why she can the doctor can not just say she was sick. She asked if she can see another doctor who would write the note instead since this doctor doesn't want to. I explained that a doctor can not just write a letter stating you were sick if the didn't see you and changing doctors is not going to do any good since that doctor has not seen you either. jw

## 2014-09-12 ENCOUNTER — Other Ambulatory Visit: Payer: Self-pay | Admitting: Internal Medicine

## 2014-09-18 ENCOUNTER — Other Ambulatory Visit: Payer: Self-pay | Admitting: Internal Medicine

## 2014-09-29 NOTE — Telephone Encounter (Signed)
Pt calling and states that "she requested a female doctor back in July" and would like to know why this has not been done for her as of yet. Would like a phone call to discuss switching PCPs. Sadie Reynolds, ASA

## 2014-10-03 ENCOUNTER — Telehealth: Payer: Self-pay | Admitting: *Deleted

## 2014-10-03 NOTE — Telephone Encounter (Signed)
Patient completed PCP change request form.  Requesting to have female PCP--"has always had female doctor since I was 17.  I feel more comfortable that my daughter and I are seen by a female doctor."  Spoke with patient and will change PCP to Dr. Alanda Slim.  Altamese Dilling, BSN, RN-BC

## 2014-10-10 ENCOUNTER — Inpatient Hospital Stay (HOSPITAL_COMMUNITY)
Admission: AD | Admit: 2014-10-10 | Discharge: 2014-10-10 | Disposition: A | Discharge status: Home / Self Care | Payer: Medicaid Other | Source: Ambulatory Visit | Attending: Obstetrics & Gynecology | Admitting: Obstetrics & Gynecology

## 2014-10-10 DIAGNOSIS — R109 Unspecified abdominal pain: Secondary | ICD-10-CM | POA: Diagnosis present

## 2014-10-10 DIAGNOSIS — B9689 Other specified bacterial agents as the cause of diseases classified elsewhere: Secondary | ICD-10-CM | POA: Diagnosis not present

## 2014-10-10 DIAGNOSIS — N760 Acute vaginitis: Secondary | ICD-10-CM | POA: Insufficient documentation

## 2014-10-10 DIAGNOSIS — A499 Bacterial infection, unspecified: Secondary | ICD-10-CM

## 2014-10-10 LAB — URINALYSIS, ROUTINE W REFLEX MICROSCOPIC
Bilirubin Urine: NEGATIVE
Glucose, UA: NEGATIVE mg/dL
Hgb urine dipstick: NEGATIVE
Ketones, ur: NEGATIVE mg/dL
Nitrite: NEGATIVE
Protein, ur: NEGATIVE mg/dL
SPECIFIC GRAVITY, URINE: 1.01 (ref 1.005–1.030)
UROBILINOGEN UA: 0.2 mg/dL (ref 0.0–1.0)
pH: 6.5 (ref 5.0–8.0)

## 2014-10-10 LAB — WET PREP, GENITAL
Trich, Wet Prep: NONE SEEN
Yeast Wet Prep HPF POC: NONE SEEN

## 2014-10-10 LAB — CBC
HCT: 40.5 % (ref 36.0–46.0)
HEMOGLOBIN: 14.1 g/dL (ref 12.0–15.0)
MCH: 32.1 pg (ref 26.0–34.0)
MCHC: 34.8 g/dL (ref 30.0–36.0)
MCV: 92.3 fL (ref 78.0–100.0)
PLATELETS: 371 10*3/uL (ref 150–400)
RBC: 4.39 MIL/uL (ref 3.87–5.11)
RDW: 12.8 % (ref 11.5–15.5)
WBC: 6.6 10*3/uL (ref 4.0–10.5)

## 2014-10-10 LAB — POCT PREGNANCY, URINE: Preg Test, Ur: NEGATIVE

## 2014-10-10 LAB — URINE MICROSCOPIC-ADD ON

## 2014-10-10 MED ORDER — METRONIDAZOLE 500 MG PO TABS: 500.0000 mg | ORAL_TABLET | Freq: Two times a day (BID) | ORAL | Status: DC

## 2014-10-10 NOTE — MAU Provider Note (Signed)
Chief Complaint: Abdominal Pain   First Provider Initiated Contact with Patient 10/10/14 0910      SUBJECTIVE HPI: Julia Potter is a 24 y.o. G1P0101 at Unknown by LMP who presents to maternity admissions reporting abdominal pain x 2 weeks and vaginal discharge described as thin, white, and with an unpleasant odor.  She reports frequent vaginal infections, both yeast and bacterial vaginosis with her Mirena IUD, so she had it removed by [facility] on 08/11/14.  She had some light bleeding after IUD removed then normal menses on 09/15/14.  She is currently using condoms to prevent pregnancy but desires another pregnancy in the next year.   She denies vaginal bleeding, vaginal itching/burning, urinary symptoms, h/a, dizziness, n/v, or fever/chills.     Abdominal Pain This is a new problem. The current episode started 1 to 4 weeks ago. The onset quality is gradual. The problem occurs intermittently. The problem has been waxing and waning. The pain is located in the LLQ, RLQ and suprapubic region. The pain is mild. The quality of the pain is cramping. The abdominal pain does not radiate. Pertinent negatives include no constipation, diarrhea, dysuria, fever, frequency, headaches, nausea or vomiting. She has tried nothing for the symptoms.    Past Medical History  Diagnosis Date  . ALLERGIC RHINITIS   . Herpes   . Anxiety    Past Surgical History  Procedure Laterality Date  . Intrauterine device insertion  09/2010   Social History   Social History  . Marital Status: Single    Spouse Name: N/A  . Number of Children: N/A  . Years of Education: N/A   Occupational History  . Not on file.   Social History Main Topics  . Smoking status: Never Smoker   . Smokeless tobacco: Never Used  . Alcohol Use: Yes     Comment: occas.  . Drug Use: No  . Sexual Activity: Yes    Birth Control/ Protection: IUD   Other Topics Concern  . Not on file   Social History Narrative   Lives with mom,  sister and brother   Has one daughter born in [address]   Wants to go to law school   No current facility-administered medications on file prior to encounter.   Current Outpatient Prescriptions on File Prior to Encounter  Medication Sig Dispense Refill  . valACYclovir (VALTREX) 500 MG tablet Take 500 mg by mouth daily.    . fluconazole (DIFLUCAN) 150 MG tablet Take 1 tablet (150 mg total) by mouth once. (Patient not taking: Reported on 10/10/2014) 1 tablet 0  . metroNIDAZOLE (FLAGYL) 500 MG tablet Take 1 tablet (500 mg total) by mouth 2 (two) times daily. (Patient not taking: Reported on 10/10/2014) 14 tablet 0  . venlafaxine (EFFEXOR) 37.5 MG tablet Take 1 tablet (37.5 mg total) by mouth 2 (two) times daily. (Patient not taking: Reported on 10/10/2014) 60 tablet 1  . [DISCONTINUED] medroxyPROGESTERone (DEPO-PROVERA) 150 MG/ML injection Inject 150 mg into the muscle every 3 (three) months.       No Known Allergies  ROS:  Review of Systems  Constitutional: Negative for fever, chills and fatigue.  HENT: Negative for sinus pressure.   Eyes: Negative for photophobia.  Respiratory: Negative for shortness of breath.   Cardiovascular: Negative for chest pain.  Gastrointestinal: Positive for abdominal pain. Negative for nausea, vomiting, diarrhea and constipation.  Genitourinary: Negative for dysuria, frequency, flank pain, vaginal bleeding, vaginal discharge, difficulty urinating, vaginal pain and pelvic pain.  Musculoskeletal: Negative for neck pain.  Neurological: Negative for dizziness, weakness and headaches.  Psychiatric/Behavioral: Negative.      I have reviewed patient's Past Medical Hx, Surgical Hx, Family Hx, Social Hx, medications and allergies.   Physical Exam   Patient Vitals for the past 24 hrs:  BP Temp Temp src Pulse Resp SpO2 Height Weight  10/10/14 0809 122/77 mmHg 99 F (37.2 C) Oral 89 18 99 % 5' 3.5" (1.613 m) 94.348 kg (208 lb)   Constitutional:  Well-developed, well-nourished female in no acute distress.  Cardiovascular: normal rate Respiratory: normal effort GI: Abd soft, non-tender. Pos BS x 4 MS: Extremities nontender, no edema, normal ROM Neurologic: Alert and oriented x 4.  GU: Neg CVAT.  PELVIC EXAM: Cervix pink, visually closed, without lesion, small amount thin white discharge, vaginal walls and external genitalia normal Bimanual exam: Cervix 0/long/high, firm, anterior, neg CMT, uterus nontender, nonenlarged, adnexa without tenderness, enlargement, or mass   LAB RESULTS Results for orders placed or performed during the hospital encounter of 10/10/14 (from the past 24 hour(s))  Wet prep, genital     Status: Abnormal   Collection Time: 10/10/14  9:15 AM  Result Value Ref Range   Yeast Wet Prep HPF POC NONE SEEN NONE SEEN   Trich, Wet Prep NONE SEEN NONE SEEN   Clue Cells Wet Prep HPF POC MODERATE (A) NONE SEEN   WBC, Wet Prep HPF POC FEW (A) NONE SEEN  Urinalysis, Routine w reflex microscopic (not at [facility])     Status: Abnormal   Collection Time: 10/10/14  9:25 AM  Result Value Ref Range   Color, Urine YELLOW YELLOW   APPearance CLEAR CLEAR   Specific Gravity, Urine 1.010 1.005 - 1.030   pH 6.5 5.0 - 8.0   Glucose, UA NEGATIVE NEGATIVE mg/dL   Hgb urine dipstick NEGATIVE NEGATIVE   Bilirubin Urine NEGATIVE NEGATIVE   Ketones, ur NEGATIVE NEGATIVE mg/dL   Protein, ur NEGATIVE NEGATIVE mg/dL   Urobilinogen, UA 0.2 0.0 - 1.0 mg/dL   Nitrite NEGATIVE NEGATIVE   Leukocytes, UA TRACE (A) NEGATIVE  Urine microscopic-add on     Status: None   Collection Time: 10/10/14  9:25 AM  Result Value Ref Range   Squamous Epithelial / LPF RARE RARE   WBC, UA 0-2 <3 WBC/hpf  Pregnancy, urine POC     Status: None   Collection Time: 10/10/14  9:27 AM  Result Value Ref Range   Preg Test, Ur NEGATIVE NEGATIVE  CBC     Status: None   Collection Time: 10/10/14  9:51 AM  Result Value Ref Range   WBC 6.6 4.0 - 10.5 K/uL   RBC 4.39  3.87 - 5.11 MIL/uL   Hemoglobin 14.1 12.0 - 15.0 g/dL   HCT 16.1 09.6 - 04.5 %   MCV 92.3 78.0 - 100.0 fL   MCH 32.1 26.0 - 34.0 pg   MCHC 34.8 30.0 - 36.0 g/dL   RDW 40.9 81.1 - 91.4 %   Platelets 371 150 - 400 K/uL       IMAGING No results found.  MAU Management/MDM: Ordered labs and reviewed results. Pt stable at time of discharge.  ASSESSMENT 1. BV (bacterial vaginosis)     PLAN Discharge home Discussed use of probiotics, decreased soaps/perfumes, breathable underwear to prevent vaginal infections Discussed normal pelvic exam with pt, reassurance provided. Pt to f/u with Family Practice if pain persists. Flagyl 500 mg PO BID x 7 days   Follow-up Information  [facility]   Why:  As needed   Contact information:   [address] [place] [address]       Follow up with [facility]ERN[facility].   Why:  As needed for emergencies   Contact information:   [address] [id number] mc [place] [address] [phone]      Sharen Counter Certified Nurse-Midwife 10/10/2014  10:30 AM

## 2014-10-10 NOTE — Discharge Instructions (Signed)
Bacterial Vaginosis °Bacterial vaginosis is a vaginal infection that occurs when the normal balance of bacteria in the vagina is disrupted. It results from an overgrowth of certain bacteria. This is the most common vaginal infection in women of childbearing age. Treatment is important to prevent complications, especially in pregnant women, as it can cause a premature delivery. °CAUSES  °Bacterial vaginosis is caused by an increase in harmful bacteria that are normally present in smaller amounts in the vagina. Several different kinds of bacteria can cause bacterial vaginosis. However, the reason that the condition develops is not fully understood. °RISK FACTORS °Certain activities or behaviors can put you at an increased risk of developing bacterial vaginosis, including: °· Having a new sex partner or multiple sex partners. °· Douching. °· Using an intrauterine device (IUD) for contraception. °Women do not get bacterial vaginosis from toilet seats, bedding, swimming pools, or contact with objects around them. °SIGNS AND SYMPTOMS  °Some women with bacterial vaginosis have no signs or symptoms. Common symptoms include: °· Grey vaginal discharge. °· A fishlike odor with discharge, especially after sexual intercourse. °· Itching or burning of the vagina and vulva. °· Burning or pain with urination. °DIAGNOSIS  °Your health care provider will take a medical history and examine the vagina for signs of bacterial vaginosis. A sample of vaginal fluid may be taken. Your health care provider will look at this sample under a microscope to check for bacteria and abnormal cells. A vaginal pH test may also be done.  °TREATMENT  °Bacterial vaginosis may be treated with antibiotic medicines. These may be given in the form of a pill or a vaginal cream. A second round of antibiotics may be prescribed if the condition comes back after treatment.  °HOME CARE INSTRUCTIONS  °· Only take over-the-counter or prescription medicines as  directed by your health care provider. °· If antibiotic medicine was prescribed, take it as directed. Make sure you finish it even if you start to feel better. °· Do not have sex until treatment is completed. °· Tell all sexual partners that you have a vaginal infection. They should see their health care provider and be treated if they have problems, such as a mild rash or itching. °· Practice safe sex by using condoms and only having one sex partner. °SEEK MEDICAL CARE IF:  °· Your symptoms are not improving after 3 days of treatment. °· You have increased discharge or pain. °· You have a fever. °MAKE SURE YOU:  °· Understand these instructions. °· Will watch your condition. °· Will get help right away if you are not doing well or get worse. °FOR MORE INFORMATION  °Centers for Disease Control and Prevention, Division of STD Prevention: www.cdc.gov/std °American Sexual Health Association (ASHA): www.ashastd.org  °Document Released: 01/17/2005 Document Revised: 11/07/2012 Document Reviewed: 08/29/2012 °ExitCare® Patient Information ©2015 [employer]. This information is not intended to replace advice given to you by your health care provider. Make sure you discuss any questions you have with your health care provider. ° °Pelvic Pain °Female pelvic pain can be caused by many different things and start from a variety of places. Pelvic pain refers to pain that is located in the lower half of the abdomen and between your hips. The pain may occur over a short period of time (acute) or may be reoccurring (chronic). The cause of pelvic pain may be related to disorders affecting the female reproductive organs (gynecologic), but it may also be related to the bladder, kidney stones, an intestinal complication,   or muscle or skeletal problems. Getting help right away for pelvic pain is important, especially if there has been severe, sharp, or a sudden onset of unusual pain. It is also important to get help right away because  some types of pelvic pain can be life threatening.  °CAUSES  °Below are only some of the causes of pelvic pain. The causes of pelvic pain can be in one of several categories.  °· Gynecologic. °¨ Pelvic inflammatory disease. °¨ Sexually transmitted infection. °¨ Ovarian cyst or a twisted ovarian ligament (ovarian torsion). °¨ Uterine lining that grows outside the uterus (endometriosis). °¨ Fibroids, cysts, or tumors. °¨ Ovulation. °· Pregnancy. °¨ Pregnancy that occurs outside the uterus (ectopic pregnancy). °¨ Miscarriage. °¨ Labor. °¨ Abruption of the placenta or ruptured uterus. °· Infection. °¨ Uterine infection (endometritis). °¨ Bladder infection. °¨ Diverticulitis. °¨ Miscarriage related to a uterine infection (septic abortion). °· Bladder. °¨ Inflammation of the bladder (cystitis). °¨ Kidney stone(s). °· Gastrointestinal. °¨ Constipation. °¨ Diverticulitis. °· Neurologic. °¨ Trauma. °¨ Feeling pelvic pain because of mental or emotional causes (psychosomatic). °· Cancers of the bowel or pelvis. °EVALUATION  °Your caregiver will want to take a careful history of your concerns. This includes recent changes in your health, a careful gynecologic history of your periods (menses), and a sexual history. Obtaining your family history and medical history is also important. Your caregiver may suggest a pelvic exam. A pelvic exam will help identify the location and severity of the pain. It also helps in the evaluation of which organ system may be involved. In order to identify the cause of the pelvic pain and be properly treated, your caregiver may order tests. These tests may include:  °· A pregnancy test. °· Pelvic ultrasonography. °· An X-ray exam of the abdomen. °· A urinalysis or evaluation of vaginal discharge. °· Blood tests. °HOME CARE INSTRUCTIONS  °· Only take over-the-counter or prescription medicines for pain, discomfort, or fever as directed by your caregiver.   °· Rest as directed by your caregiver.    °· Eat a balanced diet.   °· Drink enough fluids to make your urine clear or pale yellow, or as directed.   °· Avoid sexual intercourse if it causes pain.   °· Apply warm or cold compresses to the lower abdomen depending on which one helps the pain.   °· Avoid stressful situations.   °· Keep a journal of your pelvic pain. Write down when it started, where the pain is located, and if there are things that seem to be associated with the pain, such as food or your menstrual cycle. °· Follow up with your caregiver as directed.   °SEEK MEDICAL CARE IF: °· Your medicine does not help your pain. °· You have abnormal vaginal discharge. °SEEK IMMEDIATE MEDICAL CARE IF:  °· You have heavy bleeding from the vagina.   °· Your pelvic pain increases.   °· You feel light-headed or faint.   °· You have chills.   °· You have pain with urination or blood in your urine.   °· You have uncontrolled diarrhea or vomiting.   °· You have a fever or persistent symptoms for more than 3 days. °· You have a fever and your symptoms suddenly get worse.   °· You are being physically or sexually abused.   °MAKE SURE YOU: °· Understand these instructions. °· Will watch your condition. °· Will get help if you are not doing well or get worse. °Document Released: 12/15/2003 Document Revised: 06/03/2013 Document Reviewed: 05/09/2011 °ExitCare® Patient Information ©2015 [employer]. This information is not intended to   replace advice given to you by your health care provider. Make sure you discuss any questions you have with your health care provider. ° °

## 2014-10-10 NOTE — MAU Note (Signed)
Unable to void for UPT/ UA. Water given

## 2014-10-10 NOTE — MAU Note (Signed)
Goes to [facility], lower abd pain x 3-4 weeks

## 2014-10-10 NOTE — MAU Note (Signed)
States abd. Pain started about 4 weeks ago, had IUD removed 08/2014 thought may have been because of that.

## 2014-10-11 LAB — HIV ANTIBODY (ROUTINE TESTING W REFLEX): HIV Screen 4th Generation wRfx: NONREACTIVE

## 2014-10-13 LAB — GC/CHLAMYDIA PROBE AMP (~~LOC~~) NOT AT ARMC
Chlamydia: NEGATIVE
Neisseria Gonorrhea: NEGATIVE

## 2014-11-17 NOTE — Telephone Encounter (Signed)
PCP was changed to Dr. Altha HarmGonfa--see phone note for 10/03/14.  Altamese Dilling~Jeannette Richardson, BSN, RN-BC

## 2014-11-27 ENCOUNTER — Other Ambulatory Visit: Payer: Self-pay | Admitting: Family Medicine

## 2014-11-27 NOTE — Telephone Encounter (Signed)
Dr. Cook is in a new office, please advise refill?  

## 2014-12-31 ENCOUNTER — Encounter: Payer: Self-pay | Admitting: Family Medicine

## 2014-12-31 ENCOUNTER — Ambulatory Visit (INDEPENDENT_AMBULATORY_CARE_PROVIDER_SITE_OTHER): Payer: Medicaid Other | Admitting: Family Medicine

## 2014-12-31 ENCOUNTER — Other Ambulatory Visit (HOSPITAL_COMMUNITY)
Admission: RE | Admit: 2014-12-31 | Discharge: 2014-12-31 | Disposition: A | Discharge status: Home / Self Care | Payer: Medicaid Other | Source: Ambulatory Visit | Attending: Family Medicine | Admitting: Family Medicine

## 2014-12-31 VITALS — BP 134/71 | HR 88 | Temp 98.6°F | Ht 63.5 in | Wt 203.6 lb

## 2014-12-31 DIAGNOSIS — Z113 Encounter for screening for infections with a predominantly sexual mode of transmission: Secondary | ICD-10-CM | POA: Insufficient documentation

## 2014-12-31 DIAGNOSIS — N898 Other specified noninflammatory disorders of vagina: Secondary | ICD-10-CM | POA: Diagnosis present

## 2014-12-31 LAB — POCT WET PREP (WET MOUNT): Clue Cells Wet Prep Whiff POC: POSITIVE

## 2014-12-31 MED ORDER — FLUCONAZOLE 150 MG PO TABS: 150.0000 mg | ORAL_TABLET | Freq: Once | ORAL | Status: DC

## 2014-12-31 MED ORDER — METRONIDAZOLE 0.75 % VA GEL
1.0000 | Freq: Every day | VAGINAL | Status: DC
Start: 2014-12-31 — End: 2015-04-17

## 2014-12-31 NOTE — Assessment & Plan Note (Signed)
Findings consistent with rectovaginal is and can believes to infection - MetroGel treatment for 5 days, Diflucan 1 - Gonorrhea and Chlamydia pending

## 2014-12-31 NOTE — Progress Notes (Signed)
Patient ID: Julia Potter, female   DOB: 10-10-1990, 24 y.o.   MRN: [id number]   Subjective: CC:  Vaginal discharge HPI: This history was provided by the patient.  Patient's PMH significant for genital herpes, vaginal yeast infection and bacterial vaginosis.  Patient is a 24 y.o. female presenting to same day clinic today for vaginal discharge for several days.  Patient was diagnosed with bacterial vaginosis on 10/10/14 and completed a course of Flagyl to treat that.  She states several days ago she began having a white "milky" discharge that is not malodorous.  Patient states the discharge is occasionally pruritic.  Patient states she has had gonorrhea and chlamydia in the past and she does not believe she has that now.  Patient has had the same sexual partner x8 months and they occasionally use condoms.  Patient denies vaginal bleeding, abdominal pain, flank pain, pelvic pain/fullness, N/V/D, fever, urinary urgency/frequency, hematuria and dysuria.  Concerns today include:  1. Vaginal discharge  Social History Reviewed: Never smoker. FamHx and MedHx updated.  Please see EMR. Health Maintenance: TdAP and influenza vaccine.  Patient agreed to TdAP, but declined flu.  ROS: Per HPI  Objective: Office vital signs reviewed. BP 134/71 mmHg  Pulse 88  Temp(Src) 98.6 F (37 C) (Oral)  Ht 5' 3.5" (1.613 m)  Wt 203 lb 9.6 oz (92.352 kg)  BMI 35.50 kg/m2  LMP 11/16/2014  Physical Examination:  General: Pleasant, awake, alert, well-nourished, NAD Cardio: RRR, S1S2 heard, no murmurs appreciated, no lower extremity or pedal edema, +2 radial pulses bilaterally Pulm: CTAB, no wheezes, rhonchi or rales GI: soft, NT/ND,+BS x4, no hepatomegaly, no splenomegaly GU: Pelvic exam: normal external genitalia, vulva, vagina, and cervix.  White non-malodorous discharge noted in vaginal vault and around cervix.   Assessment/ Plan: 24 y.o. female presents with pruritic, white vaginal discharge for several  days. Patient has history of vaginal yeast infections and bacterial vaginosis.  Patient denies abdominal and pelvic pain, fever, N/V, so not concerned for PID.    Patient expressed concern that she has been diagnosed with bacterial vaginosis several times over the past year and when she takes antibiotics to treat them, she often gets yeast infections.  She is frustrated and asked, "Why do I keep getting this?"  Patient states she uses vaginal washes and douches several times a week.  Discussed with patient that the use of those products may disrupt her natural flora and create an environment for bacteria to flourish.  Also discussed hygiene before and after intercourse with patient.    Findings consistent with bacterial vaginosis and vaginal yeast infection.  Will treat with Metrogel and po Diflucan.    1. Vaginal discharge - POCT Wet Prep [facility]([place] [place]) - Cervicovaginal ancillary only - metroNIDAZOLE (METROGEL) 0.75 % vaginal gel; Place 1 Applicatorful vaginally daily. To take daily for 5 days.  Dispense: 70 g; Refill: 0 - fluconazole (DIFLUCAN) 150 MG tablet; Take 1 tablet (150 mg total) by mouth once.  Dispense: 1 tablet; Refill: 0   Nelly RoutLaura Tyree Vandruff, NP [facility]

## 2014-12-31 NOTE — Patient Instructions (Signed)
Thank you for coming in,   You wet prep indicated that you have bacterial vaginosis and a yeast infection.  I have sent medicines in for this.  Please bring all of your medications with you to each visit.   Sign up for My Chart to have easy access to your labs results, and communication with your Primary care physician   Please feel free to call with any questions or concerns at any time, at [phone]. --Dr. Jordan LikesSchmitz

## 2015-01-01 ENCOUNTER — Encounter: Payer: Self-pay | Admitting: Family Medicine

## 2015-01-01 LAB — CERVICOVAGINAL ANCILLARY ONLY
Chlamydia: NEGATIVE
NEISSERIA GONORRHEA: NEGATIVE

## 2015-02-01 DIAGNOSIS — O021 Missed abortion: Secondary | ICD-10-CM

## 2015-02-20 ENCOUNTER — Encounter: Payer: Self-pay | Admitting: Internal Medicine

## 2015-02-20 ENCOUNTER — Ambulatory Visit (INDEPENDENT_AMBULATORY_CARE_PROVIDER_SITE_OTHER): Payer: Medicaid Other | Admitting: Internal Medicine

## 2015-02-20 ENCOUNTER — Telehealth: Payer: Self-pay | Admitting: Internal Medicine

## 2015-02-20 VITALS — BP 133/79 | HR 96 | Temp 98.1°F | Wt 207.3 lb

## 2015-02-20 DIAGNOSIS — N760 Acute vaginitis: Secondary | ICD-10-CM

## 2015-02-20 DIAGNOSIS — Z331 Pregnant state, incidental: Secondary | ICD-10-CM

## 2015-02-20 DIAGNOSIS — B9689 Other specified bacterial agents as the cause of diseases classified elsewhere: Secondary | ICD-10-CM

## 2015-02-20 DIAGNOSIS — Z3490 Encounter for supervision of normal pregnancy, unspecified, unspecified trimester: Secondary | ICD-10-CM

## 2015-02-20 DIAGNOSIS — N926 Irregular menstruation, unspecified: Secondary | ICD-10-CM | POA: Diagnosis not present

## 2015-02-20 DIAGNOSIS — O209 Hemorrhage in early pregnancy, unspecified: Secondary | ICD-10-CM | POA: Diagnosis not present

## 2015-02-20 DIAGNOSIS — N898 Other specified noninflammatory disorders of vagina: Secondary | ICD-10-CM

## 2015-02-20 DIAGNOSIS — A499 Bacterial infection, unspecified: Secondary | ICD-10-CM

## 2015-02-20 LAB — POCT URINALYSIS DIPSTICK
Bilirubin, UA: NEGATIVE
GLUCOSE UA: NEGATIVE
KETONES UA: NEGATIVE
Leukocytes, UA: NEGATIVE
Nitrite, UA: NEGATIVE
PROTEIN UA: NEGATIVE
SPEC GRAV UA: 1.025
UROBILINOGEN UA: 0.2
pH, UA: 6.5

## 2015-02-20 LAB — POCT UA - MICROSCOPIC ONLY

## 2015-02-20 LAB — POCT URINE PREGNANCY: PREG TEST UR: POSITIVE — AB

## 2015-02-20 LAB — HCG, QUANTITATIVE, PREGNANCY: hCG, Beta Chain, Quant, S: 47.3 m[IU]/mL — ABNORMAL HIGH

## 2015-02-20 MED ORDER — PRENATAL VITAMINS 0.8 MG PO TABS: 1.0000 | ORAL_TABLET | Freq: Every day | ORAL | Status: DC

## 2015-02-20 MED ORDER — FLUCONAZOLE 150 MG PO TABS
150.0000 mg | ORAL_TABLET | Freq: Once | ORAL | Status: DC
Start: 2015-02-20 — End: 2015-04-17

## 2015-02-20 MED ORDER — METRONIDAZOLE 500 MG PO TABS: 500.0000 mg | ORAL_TABLET | Freq: Two times a day (BID) | ORAL | Status: DC

## 2015-02-20 NOTE — Progress Notes (Signed)
Subjective:     Patient ID: Julia Potter, female   DOB: 06-05-1990, 25 y.o.   MRN: [id number]  HPI Julia Potter is a 25-y.o. female who presents for positive home pregnancy test and late menses. Her LMP ended 01/20/15. She normally has heavy periods that are regular. She reports that her period has not started yet this month but was due a couple days ago. However, she had some spotting of BRB on her underwear 3 days ago that decreased to a pinkish tinge with wiping and now has resolved. She also reports slight abdominal cramping that is barely noticeable and unlike her normal menstrual cramping. She also felt a "heaviness" of her breast for the past 2 weeks. She denies n/v/d. She was not planning on becoming pregnant but was not trying to prevent a pregnancy. She had her IUD removed last July and has been having sex with 1 female partner several times a week without protection. She has been pregnant before and has a 6-y.o. daughter. She had pre-eclampsia with her daughter but has not been told she has high blood pressure outside of pregnancy.  Review of Systems  Gastrointestinal: Negative for nausea, vomiting, abdominal pain and diarrhea.  Genitourinary: Negative for dysuria and vaginal discharge.      Objective:   Physical Exam  Constitutional: She appears well-developed and well-nourished. No distress.  Abdominal: Soft. Bowel sounds are normal. She exhibits no distension. There is no tenderness. There is no rebound and no guarding.  Genitourinary:  Minimal brown blood at cervix. No blood in vaginal vault. Minimal amount of white discharge.    POCT pregnancy test: positive Beta hCG quant: 47.3 mIU/mL UA: negative for nitrites and leukocytes. Clue cells present on microscopy.      Assessment:     Julia Potter is a 25-y.o. female with positive pregnancy test and vaginal bleeding. Given lack of abdominal pain, lack of BRB seen on speculum exam and early state of pregnancy, most likely diagnosis is  implantation bleeding but will need to rule out ectopic pregnancy and spontaneous abortion.    Plan:     Please return in a week for lab visit to check beta hcg level and have blood pressure check.  Pregnancy - Ordered beta hcg for next week to assess for appropriate increase in hormone level to suggest viable pregnancy - Ordered transvaginal ultrasound to confirm intrauterine pregnancy - Prescribed and counseled patient to begin taking prenatal vitamins - Recommend blood pressure recheck at lab visit next week. If still elevated, patient should be started on an antihypertensive and be referred to high risk clinic.   Bacterial vaginosis - Prescribed metronidazole and diflucan in case yeast infection develops.       Julia Gobble, MD [facility], PGY-1

## 2015-02-20 NOTE — Telephone Encounter (Signed)
Called patient to inform her of clue cells seen on UA. I let her know that I have prescribed metronidazole. I also sent a prescription for diflucan in case she develops a yeast infection.

## 2015-02-20 NOTE — Assessment & Plan Note (Signed)
-   Ordered beta hcg for next week to assess for appropriate increase in hormone level to suggest viable pregnancy - Ordered transvaginal ultrasound to confirm intrauterine pregnancy - Prescribed and counseled patient to begin taking prenatal vitamins - Recommend blood pressure recheck at lab visit next week. If still elevated, patient should be started on an antihypertensive and be referred to high risk clinic.

## 2015-02-20 NOTE — Assessment & Plan Note (Signed)
-   Prescribed metronidazole and diflucan in case yeast infection develops.

## 2015-02-20 NOTE — Patient Instructions (Signed)
Julia Potter,  Thank you for coming in today. Your pregnancy test was positive. Your urine did not show signs of infection.   To confirm that this is an intrauterine pregnancy, I recommend a transvaginal ultrasound and checking hormone levels today and next week. Please make a lab appointment for next week, preferably on Friday. At that time, I would like for you to get your blood pressure rechecked as well. If your blood pressure is still high, it would be best for you to be followed at the high risk OB clinic.   Please start taking prenatal vitamins. The next step after these tests will be getting standard prenatal labs in the next few weeks.  Best, Dr. Sampson Goon  First Trimester of Pregnancy The first trimester of pregnancy is from week 1 until the end of week 12 (months 1 through 3). A week after a sperm fertilizes an egg, the egg will implant on the wall of the uterus. This embryo will begin to develop into a baby. Genes from you and your partner are forming the baby. The female genes determine whether the baby is a boy or a girl. At 6-8 weeks, the eyes and face are formed, and the heartbeat can be seen on ultrasound. At the end of 12 weeks, all the baby's organs are formed.  Now that you are pregnant, you will want to do everything you can to have a healthy baby. Two of the most important things are to get good prenatal care and to follow your health care provider's instructions. Prenatal care is all the medical care you receive before the baby's birth. This care will help prevent, find, and treat any problems during the pregnancy and childbirth. BODY CHANGES Your body goes through many changes during pregnancy. The changes vary from woman to woman.   You may gain or lose a couple of pounds at first.  You may feel sick to your stomach (nauseous) and throw up (vomit). If the vomiting is uncontrollable, call your health care provider.  You may tire easily.  You may develop headaches that can  be relieved by medicines approved by your health care provider.  You may urinate more often. Painful urination may mean you have a bladder infection.  You may develop heartburn as a result of your pregnancy.  You may develop constipation because certain hormones are causing the muscles that push waste through your intestines to slow down.  You may develop hemorrhoids or swollen, bulging veins (varicose veins).  Your breasts may begin to grow larger and become tender. Your nipples may stick out more, and the tissue that surrounds them (areola) may become darker.  Your gums may bleed and may be sensitive to brushing and flossing.  Dark spots or blotches (chloasma, mask of pregnancy) may develop on your face. This will likely fade after the baby is born.  Your menstrual periods will stop.  You may have a loss of appetite.  You may develop cravings for certain kinds of food.  You may have changes in your emotions from day to day, such as being excited to be pregnant or being concerned that something may go wrong with the pregnancy and baby.  You may have more vivid and strange dreams.  You may have changes in your hair. These can include thickening of your hair, rapid growth, and changes in texture. Some women also have hair loss during or after pregnancy, or hair that feels dry or thin. Your hair will most likely return to normal  after your baby is born. WHAT TO EXPECT AT YOUR PRENATAL VISITS During a routine prenatal visit:  You will be weighed to make sure you and the baby are growing normally.  Your blood pressure will be taken.  Your abdomen will be measured to track your baby's growth.  The fetal heartbeat will be listened to starting around week 10 or 12 of your pregnancy.  Test results from any previous visits will be discussed. Your health care provider may ask you:  How you are feeling.  If you are feeling the baby move.  If you have had any abnormal symptoms, such  as leaking fluid, bleeding, severe headaches, or abdominal cramping.  If you are using any tobacco products, including cigarettes, chewing tobacco, and electronic cigarettes.  If you have any questions. Other tests that may be performed during your first trimester include:  Blood tests to find your blood type and to check for the presence of any previous infections. They will also be used to check for low iron levels (anemia) and Rh antibodies. Later in the pregnancy, blood tests for diabetes will be done along with other tests if problems develop.  Urine tests to check for infections, diabetes, or protein in the urine.  An ultrasound to confirm the proper growth and development of the baby.  An amniocentesis to check for possible genetic problems.  Fetal screens for spina bifida and Down syndrome.  You may need other tests to make sure you and the baby are doing well.  HIV (human immunodeficiency virus) testing. Routine prenatal testing includes screening for HIV, unless you choose not to have this test. HOME CARE INSTRUCTIONS  Medicines  Follow your health care provider's instructions regarding medicine use. Specific medicines may be either safe or unsafe to take during pregnancy.  Take your prenatal vitamins as directed.  If you develop constipation, try taking a stool softener if your health care provider approves. Diet  Eat regular, well-balanced meals. Choose a variety of foods, such as meat or vegetable-based protein, fish, milk and low-fat dairy products, vegetables, fruits, and whole grain breads and cereals. Your health care provider will help you determine the amount of weight gain that is right for you.  Avoid raw meat and uncooked cheese. These carry germs that can cause birth defects in the baby.  Eating four or five small meals rather than three large meals a day may help relieve nausea and vomiting. If you start to feel nauseous, eating a few soda crackers can be  helpful. Drinking liquids between meals instead of during meals also seems to help nausea and vomiting.  If you develop constipation, eat more high-fiber foods, such as fresh vegetables or fruit and whole grains. Drink enough fluids to keep your urine clear or pale yellow. Activity and Exercise  Exercise only as directed by your health care provider. Exercising will help you:  Control your weight.  Stay in shape.  Be prepared for labor and delivery.  Experiencing pain or cramping in the lower abdomen or low back is a good sign that you should stop exercising. Check with your health care provider before continuing normal exercises.  Try to avoid standing for long periods of time. Move your legs often if you must stand in one place for a long time.  Avoid heavy lifting.  Wear low-heeled shoes, and practice good posture.  You may continue to have sex unless your health care provider directs you otherwise. Relief of Pain or Discomfort  Wear a good  support bra for breast tenderness.   Take warm sitz baths to soothe any pain or discomfort caused by hemorrhoids. Use hemorrhoid cream if your health care provider approves.   Rest with your legs elevated if you have leg cramps or low back pain.  If you develop varicose veins in your legs, wear support hose. Elevate your feet for 15 minutes, 3-4 times a day. Limit salt in your diet. Prenatal Care  Schedule your prenatal visits by the twelfth week of pregnancy. They are usually scheduled monthly at first, then more often in the last 2 months before delivery.  Write down your questions. Take them to your prenatal visits.  Keep all your prenatal visits as directed by your health care provider. Safety  Wear your seat belt at all times when driving.  Make a list of emergency phone numbers, including numbers for family, friends, the hospital, and police and fire departments. General Tips  Ask your health care provider for a referral to  a local prenatal education class. Begin classes no later than at the beginning of month 6 of your pregnancy.  Ask for help if you have counseling or nutritional needs during pregnancy. Your health care provider can offer advice or refer you to specialists for help with various needs.  Do not use hot tubs, steam rooms, or saunas.  Do not douche or use tampons or scented sanitary pads.  Do not cross your legs for long periods of time.  Avoid cat litter boxes and soil used by cats. These carry germs that can cause birth defects in the baby and possibly loss of the fetus by miscarriage or stillbirth.  Avoid all smoking, herbs, alcohol, and medicines not prescribed by your health care provider. Chemicals in these affect the formation and growth of the baby.  Do not use any tobacco products, including cigarettes, chewing tobacco, and electronic cigarettes. If you need help quitting, ask your health care provider. You may receive counseling support and other resources to help you quit.  Schedule a dentist appointment. At home, brush your teeth with a soft toothbrush and be gentle when you floss. SEEK MEDICAL CARE IF:   You have dizziness.  You have mild pelvic cramps, pelvic pressure, or nagging pain in the abdominal area.  You have persistent nausea, vomiting, or diarrhea.  You have a bad smelling vaginal discharge.  You have pain with urination.  You notice increased swelling in your face, hands, legs, or ankles. SEEK IMMEDIATE MEDICAL CARE IF:   You have a fever.  You are leaking fluid from your vagina.  You have spotting or bleeding from your vagina.  You have severe abdominal cramping or pain.  You have rapid weight gain or loss.  You vomit blood or material that looks like coffee grounds.  You are exposed to [place] measles and have never had them.  You are exposed to fifth disease or chickenpox.  You develop a severe headache.  You have shortness of breath.  You  have any kind of trauma, such as from a fall or a car accident.   This information is not intended to replace advice given to you by your health care provider. Make sure you discuss any questions you have with your health care provider.   Document Released: 01/11/2001 Document Revised: 02/07/2014 Document Reviewed: 11/27/2012 Elsevier Interactive Patient Education [employer]

## 2015-02-21 ENCOUNTER — Inpatient Hospital Stay (HOSPITAL_COMMUNITY): Payer: Medicaid Other

## 2015-02-21 ENCOUNTER — Encounter (HOSPITAL_COMMUNITY): Payer: Self-pay

## 2015-02-21 ENCOUNTER — Inpatient Hospital Stay (HOSPITAL_COMMUNITY)
Admission: AD | Admit: 2015-02-21 | Discharge: 2015-02-21 | Disposition: A | Discharge status: Home / Self Care | Payer: Medicaid Other | Source: Ambulatory Visit | Attending: Obstetrics & Gynecology | Admitting: Obstetrics & Gynecology

## 2015-02-21 DIAGNOSIS — O034 Incomplete spontaneous abortion without complication: Secondary | ICD-10-CM

## 2015-02-21 DIAGNOSIS — N939 Abnormal uterine and vaginal bleeding, unspecified: Secondary | ICD-10-CM | POA: Diagnosis present

## 2015-02-21 DIAGNOSIS — O209 Hemorrhage in early pregnancy, unspecified: Secondary | ICD-10-CM

## 2015-02-21 LAB — URINE MICROSCOPIC-ADD ON: BACTERIA UA: NONE SEEN

## 2015-02-21 LAB — HCG, QUANTITATIVE, PREGNANCY: HCG, BETA CHAIN, QUANT, S: 33 m[IU]/mL — AB (ref <5)

## 2015-02-21 LAB — CBC
HCT: 39.5 % (ref 36.0–46.0)
HEMOGLOBIN: 13.4 g/dL (ref 12.0–15.0)
MCH: 31.2 pg (ref 26.0–34.0)
MCHC: 33.9 g/dL (ref 30.0–36.0)
MCV: 92.1 fL (ref 78.0–100.0)
Platelets: 400 10*3/uL (ref 150–400)
RBC: 4.29 MIL/uL (ref 3.87–5.11)
RDW: 13.5 % (ref 11.5–15.5)
WBC: 8.9 10*3/uL (ref 4.0–10.5)

## 2015-02-21 LAB — URINALYSIS, ROUTINE W REFLEX MICROSCOPIC
Bilirubin Urine: NEGATIVE
GLUCOSE, UA: NEGATIVE mg/dL
KETONES UR: NEGATIVE mg/dL
LEUKOCYTES UA: NEGATIVE
NITRITE: NEGATIVE
PROTEIN: NEGATIVE mg/dL
Specific Gravity, Urine: 1.015 (ref 1.005–1.030)
pH: 7 (ref 5.0–8.0)

## 2015-02-21 MED ORDER — RHO D IMMUNE GLOBULIN 1500 UNIT/2ML IJ SOSY
300.0000 ug | PREFILLED_SYRINGE | Freq: Once | INTRAMUSCULAR | Status: AC
  Administered 2015-02-21: 300 ug via INTRAMUSCULAR
  Filled 2015-02-21: qty 2

## 2015-02-21 NOTE — MAU Provider Note (Signed)
History     CSN: [id number]  Arrival date and time: 02/21/15 1317   First Provider Initiated Contact with Patient 02/21/15 1505      Chief Complaint  Patient presents with  . Vaginal Bleeding  . Abdominal Cramping   HPI Julia Potter 25 y.o.  Comes to MAU for repeat quant.  Also is having more vaginal bleeding even though it is a small amount. Is more red in color and is having cramping now.  OB History    Gravida Para Term Preterm AB TAB SAB Ectopic Multiple Living   Obstetric Comments   G1P0101 Admitted to [facility] for PIH, IOL. Delivered a viable female at 56 weeks      Past Medical History  Diagnosis Date  . ALLERGIC RHINITIS   . Herpes   . Anxiety     Past Surgical History  Procedure Laterality Date  . Intrauterine device insertion  09/2010    History reviewed. No pertinent family history.  Social History  Substance Use Topics  . Smoking status: Never Smoker   . Smokeless tobacco: Never Used  . Alcohol Use: Yes     Comment: occas.    Allergies: No Known Allergies  Prescriptions prior to admission  Medication Sig Dispense Refill Last Dose  . diphenhydrAMINE (BENADRYL) 25 MG tablet Take 25 mg by mouth at bedtime as needed for allergies.   prn  . ibuprofen (ADVIL,MOTRIN) 200 MG tablet Take 400 mg by mouth every 6 (six) hours as needed for moderate pain.   prn  . valACYclovir (VALTREX) 500 MG tablet TAKE 1 TABLET (500 MG TOTAL) BY MOUTH DAILY. 30 tablet 5 Past Week at Unknown time  . fluconazole (DIFLUCAN) 150 MG tablet Take 1 tablet (150 mg total) by mouth once. 1 tablet 0   . metroNIDAZOLE (FLAGYL) 500 MG tablet Take 1 tablet (500 mg total) by mouth 2 (two) times daily. 14 tablet 0   . metroNIDAZOLE (METROGEL) 0.75 % vaginal gel Place 1 Applicatorful vaginally daily. To take daily for 5 days. (Patient not taking: Reported on 02/21/2015) 70 g 0 Completed Course at Unknown time  . Prenatal Multivit-Min-Fe-FA (PRENATAL VITAMINS) 0.8 MG tablet  Take 1 tablet by mouth daily. 30 tablet 10     Review of Systems  Constitutional: Negative for fever.  Gastrointestinal: Positive for abdominal pain. Negative for nausea and vomiting.  Genitourinary:       No vaginal discharge. Vaginal bleeding. No dysuria.   Physical Exam   Blood pressure 131/81, pulse 103, temperature 98.3 F (36.8 C), temperature source Oral, resp. rate 18, last menstrual period 01/16/2015.  Physical Exam  Nursing note and vitals reviewed. Constitutional: She is oriented to person, place, and time. She appears well-developed and well-nourished.  HENT:  Head: Normocephalic.  Eyes: EOM are normal.  Neck: Neck supple.  Genitourinary:  Declines pelvic exam today.  Musculoskeletal: Normal range of motion.  Neurological: She is alert and oriented to person, place, and time.  Skin: Skin is warm and dry.  Psychiatric: She has a normal mood and affect.    MAU Course  Procedures Results for orders placed or performed during the hospital encounter of 02/21/15 (from the past 24 hour(s))  Urinalysis, Routine w reflex microscopic (not at [facility])     Status: Abnormal   Collection Time: 02/21/15  1:35 PM  Result Value Ref Range   Color, Urine YELLOW YELLOW   APPearance  CLEAR CLEAR   Specific Gravity, Urine 1.015 1.005 - 1.030   pH 7.0 5.0 - 8.0   Glucose, UA NEGATIVE NEGATIVE mg/dL   Hgb urine dipstick LARGE (A) NEGATIVE   Bilirubin Urine NEGATIVE NEGATIVE   Ketones, ur NEGATIVE NEGATIVE mg/dL   Protein, ur NEGATIVE NEGATIVE mg/dL   Nitrite NEGATIVE NEGATIVE   Leukocytes, UA NEGATIVE NEGATIVE  Urine microscopic-add on     Status: Abnormal   Collection Time: 02/21/15  1:35 PM  Result Value Ref Range   Squamous Epithelial / LPF 0-5 (A) NONE SEEN   WBC, UA 0-5 0 - 5 WBC/hpf   RBC / HPF TOO NUMEROUS TO COUNT 0 - 5 RBC/hpf   Bacteria, UA NONE SEEN NONE SEEN   Urine-Other MUCOUS PRESENT   hCG, quantitative, pregnancy     Status: Abnormal   Collection Time:  02/21/15  1:55 PM  Result Value Ref Range   hCG, Beta Chain, Quant, S 33 (H) <5 mIU/mL  CBC     Status: None   Collection Time: 02/21/15  1:55 PM  Result Value Ref Range   WBC 8.9 4.0 - 10.5 K/uL   RBC 4.29 3.87 - 5.11 MIL/uL   Hemoglobin 13.4 12.0 - 15.0 g/dL   HCT 16.1 09.6 - 04.5 %   MCV 92.1 78.0 - 100.0 fL   MCH 31.2 26.0 - 34.0 pg   MCHC 33.9 30.0 - 36.0 g/dL   RDW 40.9 81.1 - 91.4 %   Platelets 400 150 - 400 K/uL  Rh IG workup (includes ABO/Rh)     Status: None (Preliminary result)   Collection Time: 02/21/15  1:55 PM  Result Value Ref Range   Gestational Age(Wks) 5    ABO/RH(D) A NEG    Antibody Screen NEG    Unit Number [id number]    Blood Component Type RHIG    Unit division 00    Status of Unit ISSUED    Transfusion Status OK TO TRANSFUSE    MDM CLINICAL DATA: Bleeding. Quantitative beta HCG measuring 33  EXAM: OBSTETRIC <14 WK [place] AND TRANSVAGINAL OB US  TECHNIQUE: Both transabdominal and transvaginal ultrasound examinations were performed for complete evaluation of the gestation as well as the maternal uterus, adnexal regions, and pelvic cul-de-sac. Transvaginal technique was performed to assess early pregnancy.  COMPARISON: None.  FINDINGS: No intrauterine pregnancy identified. Endometrium appears homogeneous in thickness throughout with a normal demonstrated measurement of 5 mm. No mass or fluid within the endometrial canal.  Both ovaries are normal in size and echogenicity without mass or cyst. No mass or free fluid identified in either adnexal region. No mass or free fluid in the cul-de-sac.  IMPRESSION: Normal pelvic ultrasound. No intrauterine pregnancy. No evidence of ectopic pregnancy. Both ovaries appear normal and there is no mass or free fluid seen within either adnexal region.  Discussed lab results and ultrasound results - based on all findings, this is a miscarriage in progress.  Would not expect heavy bleeding as no  pregnancy is seen in the uterus.  Discussed at length the importance of follow up in one week for repeat lab work - need the pregnancy hormone to be zero.  Client states she will come to the appointment.  Assessment and Plan  Falling quants Vaginal bleeding Incomplete Miscarriage  Plan Return to clinic here on Monday Jan. 30 between 8 am and 11 am for quant.  Hopefully quant will be to zero.  Will not need ultrasound scheduled on Jan. 30. Rhogam  given due to A neg blood type.  Rayyan Burley 02/21/2015, 4:11 PM

## 2015-02-21 NOTE — Discharge Instructions (Signed)
Plan to return to the clinic on Jan. 30 for repeat lab work.

## 2015-02-21 NOTE — MAU Note (Addendum)
Patient presents with vaginal bleeding bright red today, was pink all week, mild cramping only has put one pad. Intercourse yesterday.

## 2015-02-22 LAB — RH IG WORKUP (INCLUDES ABO/RH)
ABO/RH(D): A NEG
Antibody Screen: NEGATIVE
Gestational Age(Wks): 5
Unit division: 0

## 2015-03-02 ENCOUNTER — Ambulatory Visit: Payer: Medicaid Other | Admitting: Obstetrics and Gynecology

## 2015-03-02 ENCOUNTER — Ambulatory Visit (HOSPITAL_COMMUNITY): Payer: Medicaid Other

## 2015-03-06 NOTE — Progress Notes (Signed)
I assisted Deven RN with admission paperwork.Eda H Royal  Interpreter.

## 2015-04-07 ENCOUNTER — Ambulatory Visit (INDEPENDENT_AMBULATORY_CARE_PROVIDER_SITE_OTHER): Payer: Medicaid Other | Admitting: Obstetrics and Gynecology

## 2015-04-07 ENCOUNTER — Encounter: Payer: Self-pay | Admitting: Obstetrics and Gynecology

## 2015-04-07 ENCOUNTER — Other Ambulatory Visit (HOSPITAL_COMMUNITY)
Admission: RE | Admit: 2015-04-07 | Discharge: 2015-04-07 | Disposition: A | Discharge status: Home / Self Care | Payer: Medicaid Other | Source: Ambulatory Visit | Attending: Family Medicine | Admitting: Family Medicine

## 2015-04-07 VITALS — BP 127/77 | HR 87 | Temp 97.6°F | Ht 63.5 in | Wt 203.0 lb

## 2015-04-07 DIAGNOSIS — N898 Other specified noninflammatory disorders of vagina: Secondary | ICD-10-CM

## 2015-04-07 DIAGNOSIS — O039 Complete or unspecified spontaneous abortion without complication: Secondary | ICD-10-CM

## 2015-04-07 DIAGNOSIS — Z113 Encounter for screening for infections with a predominantly sexual mode of transmission: Secondary | ICD-10-CM | POA: Diagnosis present

## 2015-04-07 LAB — POCT WET PREP (WET MOUNT): CLUE CELLS WET PREP WHIFF POC: NEGATIVE

## 2015-04-07 LAB — HCG, QUANTITATIVE, PREGNANCY: hCG, Beta Chain, Quant, S: <2 m[IU]/mL

## 2015-04-07 NOTE — Progress Notes (Signed)
   Subjective:   Patient ID: Julia Potter, female    DOB: [birth year], 25 y.o.   MRN: [id number]  Patient presents for Same Day Appointment  Chief Complaint  Patient presents with  . Vaginal Discharge    HPI: #VAGINAL DISCHARGE Having vaginal discharge for 2 weeks Medications tried: none; has used flagyl gel and pill previously and states it doesn't work because she gets recurrent BV Discharge consistency: thin Discharge color: milky Recent antibiotic use: no Sex in last month: yes, intercourse last Sunday unprotected  Possible STD exposure: Yes, also has h/o STD Has had BV and yeast before - last had in January  Symptoms Fever: no Dysuria: no Vaginal bleeding: no Pelvic pain: yes, week Genital sores or ulcers: no Rash: no Pain during sex: no  ROS see HPI Smoking Status noted  Objective:  Ht 5' 3.5" (1.613 m)  Wt 203 lb (92.08 kg)  BMI 35.39 kg/m2  LMP 01/16/2015 (Exact Date)  Breastfeeding? Unknown Vitals and nursing note reviewed  Physical Exam  Constitutional: She is well-developed, well-nourished, and in no distress.  Abdominal: Soft. Bowel sounds are normal. There is no tenderness.  Genitourinary: Vagina normal and vulva normal. No vaginal discharge found.   Results for orders placed or performed in visit on 04/07/15 (from the past 24 hour(s))  POCT Wet Prep Mellody Drown([place])     Status: Abnormal   Collection Time: 04/07/15  9:48 AM  Result Value Ref Range   Source Wet Prep POC VAG    WBC, Wet Prep HPF POC 1-3    Bacteria Wet Prep HPF POC Few None, Few, Too numerous to count   Clue Cells Wet Prep HPF POC Few (A) None, Too numerous to count   Clue Cells Wet Prep Whiff POC Negative Whiff    Yeast Wet Prep HPF POC Few    Trichomonas Wet Prep HPF POC NONE     Assessment & Plan:  1. Vaginal discharge Patient presented with complaints of vaginal discharge for the last 2 weeks. Has history of recurrent BV. Wet prep today without concern for BV. Comments section  said rare clue and bacteria seen. Do not feel like this needs to be treated. Due to concern for possible STDs urine GC/Chl also sent. Discussed pelvic hygiene and normal physiologic discharge. Patient to return to clinic prn. Suggested long term regimen for recurrent BV in patient who continuously has BV (>3x a year).  2. Miscarriage Patient with recent incomplete miscarriage in January. She never followed up. Last hCG was still elevated. Will repeat quant hCG today. Patient with recent unprotected intercourse so also could be pregnant again.    Caryl AdaJazma Phelps, DO 04/07/2015, 9:32 AM PGY-2, [facility]

## 2015-04-07 NOTE — Patient Instructions (Signed)
No signs of BV or yeast infection today.  Will not treat If symptoms get worse please return to clinic Sending off other test so will call you about those results and your blood work  Thanks for allowing me to be a part of your care! Dr. Doroteo GlassmanPhelps  Here is the regimen I would suggest for recurrent BV:   Metronidazole 500 mg po daily x 7 days, followed by 0.75% Metronidazole gel nightly x 10 days then twice a week for 6 months. This is is the recommended therapy for suppression of bacterial vaginosis.

## 2015-04-08 ENCOUNTER — Telehealth: Payer: Self-pay | Admitting: Student

## 2015-04-08 LAB — URINE CYTOLOGY ANCILLARY ONLY
Chlamydia: POSITIVE — AB
Neisseria Gonorrhea: NEGATIVE

## 2015-04-08 NOTE — Telephone Encounter (Signed)
Would like yesterdays test results   °

## 2015-04-08 NOTE — Telephone Encounter (Signed)
GC/Chl results returned with positive chlamydia. Discussed with patient that she needs to come into clinic to get treated with Azithromycin 1g orally. If she does not come into clinic within the next 24/hrs will need to prescribe medication to be sent to her pharmacy.

## 2015-04-08 NOTE — Telephone Encounter (Signed)
Called with no response. LVM for patient to call clinic back. Please let her know all results were normal.

## 2015-04-09 ENCOUNTER — Ambulatory Visit (INDEPENDENT_AMBULATORY_CARE_PROVIDER_SITE_OTHER): Payer: Medicaid Other | Admitting: *Deleted

## 2015-04-09 DIAGNOSIS — A749 Chlamydial infection, unspecified: Secondary | ICD-10-CM | POA: Diagnosis not present

## 2015-04-09 MED ORDER — AZITHROMYCIN 500 MG PO TABS
1000.0000 mg | ORAL_TABLET | Freq: Once | ORAL | Status: AC
  Administered 2015-04-09: 1000 mg via ORAL

## 2015-04-09 NOTE — Progress Notes (Signed)
   Patient in nurse clinic for chlamydia treatment.  Azithromycin 1 GM PO x 1 given per verbal order by Dr. Doroteo GlassmanPhelps.  Patient advised to contact partner.  No until partner has been tested and treated.  Patient verbalized understanding.  Clovis PuMartin, Tamika L, RN

## 2015-04-14 NOTE — Telephone Encounter (Signed)
LM for pt. Rx was sent to pharmacy. Calling to make sure pt has gotten meds. Sunday SpillersSharon T Saunders, CMA

## 2015-04-17 ENCOUNTER — Ambulatory Visit (INDEPENDENT_AMBULATORY_CARE_PROVIDER_SITE_OTHER): Payer: Medicaid Other | Admitting: Family Medicine

## 2015-04-17 ENCOUNTER — Encounter: Payer: Self-pay | Admitting: Family Medicine

## 2015-04-17 ENCOUNTER — Other Ambulatory Visit (HOSPITAL_COMMUNITY)
Admission: RE | Admit: 2015-04-17 | Discharge: 2015-04-17 | Disposition: A | Discharge status: Home / Self Care | Payer: Medicaid Other | Source: Ambulatory Visit | Attending: Family Medicine | Admitting: Family Medicine

## 2015-04-17 VITALS — BP 115/61 | HR 91 | Temp 98.5°F | Ht 62.0 in

## 2015-04-17 DIAGNOSIS — A499 Bacterial infection, unspecified: Secondary | ICD-10-CM | POA: Diagnosis not present

## 2015-04-17 DIAGNOSIS — N760 Acute vaginitis: Secondary | ICD-10-CM | POA: Diagnosis not present

## 2015-04-17 DIAGNOSIS — B9689 Other specified bacterial agents as the cause of diseases classified elsewhere: Secondary | ICD-10-CM

## 2015-04-17 DIAGNOSIS — Z113 Encounter for screening for infections with a predominantly sexual mode of transmission: Secondary | ICD-10-CM | POA: Diagnosis present

## 2015-04-17 DIAGNOSIS — N898 Other specified noninflammatory disorders of vagina: Secondary | ICD-10-CM | POA: Diagnosis not present

## 2015-04-17 LAB — POCT WET PREP (WET MOUNT): CLUE CELLS WET PREP WHIFF POC: POSITIVE

## 2015-04-17 MED ORDER — METRONIDAZOLE 0.75 % VA GEL: 1.0000 | Freq: Every day | VAGINAL | Status: DC

## 2015-04-17 MED ORDER — METRONIDAZOLE 500 MG PO TABS: 500.0000 mg | ORAL_TABLET | Freq: Two times a day (BID) | ORAL | Status: DC

## 2015-04-17 MED ORDER — FLUCONAZOLE 150 MG PO TABS: 150.0000 mg | ORAL_TABLET | Freq: Once | ORAL | Status: DC

## 2015-04-17 NOTE — Progress Notes (Signed)
   Subjective: CC: vaginal discharge ZOX:[id number]:Julia Potter is a 25 y.o. female presenting to clinic today for same day appointment. PCP: Almon Herculesaye T Gonfa, MD Concerns today include:  1. Vaginal Discharge Patient reports that discharge started several week ago.  She notes that discharge appears white milky.  She now has developed vaginal odor over the last week.  Endorses vaginal pruritis.  She denies abnormal vaginal bleeding, dysuria, hematuria, pelvic pain, nausea, vomiting, fevers.   Was treated with abx recently.   Positive history of STIs (chlamydia 04/07/15).  She not currently sexually active.  Contraception: abstinence.  Patient's last menstrual period was 03/23/2015.  Social History Reviewed. FamHx and MedHx reviewed.  Please see EMR. Health Maintenance: Flu, TDap  ROS: Per HPI  Objective: Office vital signs reviewed. BP 115/61 mmHg  Pulse 91  Temp(Src) 98.5 F (36.9 C) (Oral)  Ht 5\' 2"  (1.575 m)  LMP 03/23/2015  Physical Examination:  General: Awake, alert, well nourished, No acute distress HEENT: Normal, MMM GU: external vaginal tissue normal, cervix midline, no punctate lesions on cervix appreciated, moderate white milky discharge from cervical os, no bleeding, no cervical motion tenderness, no abdominal/ adnexal masses  Results for orders placed or performed in visit on 04/17/15 (from the past 24 hour(s))  POCT Wet Prep Mellody Drown(Wet [place])     Status: Abnormal   Collection Time: 04/17/15 10:39 AM  Result Value Ref Range   Source Wet Prep POC VAG    WBC, Wet Prep HPF POC 1-5    Bacteria Wet Prep HPF POC Moderate (A) None, Few, Too numerous to count   Clue Cells Wet Prep HPF POC Moderate (A) None, Too numerous to count   Clue Cells Wet Prep Whiff POC Positive Whiff    Yeast Wet Prep HPF POC None    Trichomonas Wet Prep HPF POC NONE    Assessment/ Plan: 25 y.o. female   1. Vaginal discharge. Wishes to have a test of cure for chlamydia.  Wet prep suggestive of BV, see below. -  POCT Wet Prep [facility]([place]et [place]) - Cervicovaginal ancillary only - fluconazole (DIFLUCAN) 150 MG tablet; Take 1 tablet (150 mg total) by mouth once.  Dispense: 1 tablet; Refill: 0 - metroNIDAZOLE (METROGEL) 0.75 % vaginal gel; Place 1 Applicatorful vaginally daily. Twice a week  Dispense: 70 g; Refill: 3 - Will contact with GC/CT results.  2. Bacterial vaginosis - Since patient has had recurrent BV, will treat and suppress for the next 6 months per UpToDate guidelines.  Recommend follow up in 6 months or sooner if needed. - fluconazole (DIFLUCAN) 150 MG tablet; Take 1 tablet (150 mg total) by mouth once.  Dispense: 1 tablet; Refill: 0 - metroNIDAZOLE (FLAGYL) 500 MG tablet; Take 1 tablet (500 mg total) by mouth 2 (two) times daily.  Dispense: 14 tablet; Refill: 0 - metroNIDAZOLE (METROGEL) 0.75 % vaginal gel; Place 1 Applicatorful vaginally twice a week  Dispense: 70 g; Refill: 3 - Consider referral to GYN vs use of Boric acid per vagina if refractory to vaginal metronidazole.  Raliegh IpAshly M Gottschalk, DO PGY-2, [facility]

## 2015-04-17 NOTE — Patient Instructions (Signed)
It appears that you have BV.  I have prescribed a 1 week Flagyl prescription.  You will start the vaginal gel after you have completed this medication.  You will use it twice weekly for the next 6 months.  Follow up with your PCP.

## 2015-04-20 ENCOUNTER — Telehealth: Payer: Self-pay | Admitting: Student

## 2015-04-20 LAB — CERVICOVAGINAL ANCILLARY ONLY
Chlamydia: NEGATIVE
NEISSERIA GONORRHEA: NEGATIVE

## 2015-04-20 NOTE — Telephone Encounter (Signed)
Please call her back to let her know her results just came back and are negative.

## 2015-04-20 NOTE — Telephone Encounter (Signed)
Pt is calling to get her lab results from 04/17/15. Please call . jw

## 2015-04-21 NOTE — Telephone Encounter (Signed)
Pt informed. Luisdaniel Kenton T Shomari Scicchitano, CMA  

## 2015-04-27 NOTE — Telephone Encounter (Signed)
Pt had an appointment on 04/17/2015. Sunday SpillersSharon T Saunders, CMA

## 2015-04-29 ENCOUNTER — Encounter (HOSPITAL_COMMUNITY): Payer: Self-pay | Admitting: *Deleted

## 2015-04-29 ENCOUNTER — Inpatient Hospital Stay (HOSPITAL_COMMUNITY)
Admission: AD | Admit: 2015-04-29 | Discharge: 2015-04-29 | Disposition: A | Discharge status: Home / Self Care | Payer: Medicaid Other | Source: Ambulatory Visit | Attending: Obstetrics & Gynecology | Admitting: Obstetrics & Gynecology

## 2015-04-29 DIAGNOSIS — Z79899 Other long term (current) drug therapy: Secondary | ICD-10-CM | POA: Insufficient documentation

## 2015-04-29 DIAGNOSIS — B373 Candidiasis of vulva and vagina: Secondary | ICD-10-CM | POA: Diagnosis not present

## 2015-04-29 DIAGNOSIS — B3731 Acute candidiasis of vulva and vagina: Secondary | ICD-10-CM

## 2015-04-29 DIAGNOSIS — F419 Anxiety disorder, unspecified: Secondary | ICD-10-CM | POA: Diagnosis not present

## 2015-04-29 DIAGNOSIS — N898 Other specified noninflammatory disorders of vagina: Secondary | ICD-10-CM | POA: Diagnosis present

## 2015-04-29 HISTORY — DX: Depression, unspecified: F32.A

## 2015-04-29 HISTORY — DX: Chlamydial infection, unspecified: A74.9

## 2015-04-29 HISTORY — DX: Major depressive disorder, single episode, unspecified: F32.9

## 2015-04-29 HISTORY — DX: Gestational (pregnancy-induced) hypertension without significant proteinuria, unspecified trimester: O13.9

## 2015-04-29 LAB — URINE MICROSCOPIC-ADD ON

## 2015-04-29 LAB — URINALYSIS, ROUTINE W REFLEX MICROSCOPIC
Bilirubin Urine: NEGATIVE
Glucose, UA: NEGATIVE mg/dL
Ketones, ur: NEGATIVE mg/dL
NITRITE: NEGATIVE
PROTEIN: NEGATIVE mg/dL
Specific Gravity, Urine: 1.01 (ref 1.005–1.030)
pH: 7 (ref 5.0–8.0)

## 2015-04-29 LAB — WET PREP, GENITAL
CLUE CELLS WET PREP: NONE SEEN
SPERM: NONE SEEN
TRICH WET PREP: NONE SEEN

## 2015-04-29 LAB — POCT PREGNANCY, URINE: PREG TEST UR: NEGATIVE

## 2015-04-29 MED ORDER — FLUCONAZOLE 150 MG PO TABS: 150.0000 mg | ORAL_TABLET | Freq: Every day | ORAL | Status: DC

## 2015-04-29 NOTE — Discharge Instructions (Signed)

## 2015-04-29 NOTE — MAU Note (Signed)
Had miscarriage in Jan.  Has been seen in office, was recently treated for Chlamydia.  Now having " tissue" coming out of vagina, noted on the 25th.  Did not call the office.

## 2015-04-29 NOTE — MAU Provider Note (Signed)
History     CSN: [id number]  Arrival date and time: 04/29/15 3086   First Provider Initiated Contact with Patient 04/29/15 [phone]       Chief Complaint  Patient presents with  . Vaginal Discharge   HPI  Julia Potter is a 25 y.o. female who presents for vaginal discharge. Reports vaginal discharge, irritation/itching, and passing "brown tissue" since 3/25. Denies vaginal bleeding or urinary complaints. Has not been sexually active since February. Was diagnosed with chlamydia on 3/8, was treated and retested a week later with negative result. Currently using metrogel twice weekly for recurrent BV.  States the discharge is light brown and has clumps in it.    OB History    Gravida Para Term Preterm AB TAB SAB Ectopic Multiple Living   Obstetric Comments   G1P0101 Admitted to [facility] for PIH, IOL. Delivered a viable female at 4 weeks      Past Medical History  Diagnosis Date  . ALLERGIC RHINITIS   . Herpes   . Anxiety     Past Surgical History  Procedure Laterality Date  . Intrauterine device insertion  09/2010    No family history on file.  Social History  Substance Use Topics  . Smoking status: Never Smoker   . Smokeless tobacco: Never Used  . Alcohol Use: Yes     Comment: occas.    Allergies: No Known Allergies  Prescriptions prior to admission  Medication Sig Dispense Refill Last Dose  . diphenhydrAMINE (BENADRYL) 25 MG tablet Take 25 mg by mouth at bedtime as needed for allergies.   prn  . fluconazole (DIFLUCAN) 150 MG tablet Take 1 tablet (150 mg total) by mouth once. 1 tablet 0   . ibuprofen (ADVIL,MOTRIN) 200 MG tablet Take 400 mg by mouth every 6 (six) hours as needed for moderate pain.   prn  . metroNIDAZOLE (FLAGYL) 500 MG tablet Take 1 tablet (500 mg total) by mouth 2 (two) times daily. 14 tablet 0   . metroNIDAZOLE (METROGEL) 0.75 % vaginal gel Place 1 Applicatorful vaginally daily. Twice a week 70 g 3   . Prenatal  Multivit-Min-Fe-FA (PRENATAL VITAMINS) 0.8 MG tablet Take 1 tablet by mouth daily. 30 tablet 10   . valACYclovir (VALTREX) 500 MG tablet TAKE 1 TABLET (500 MG TOTAL) BY MOUTH DAILY. 30 tablet 5 Past Week at Unknown time    Review of Systems  Constitutional: Negative.   Gastrointestinal: Negative.   Genitourinary: Negative for dysuria.       + vaginal discharge & irritation No vaginal bleeding   Physical Exam   Blood pressure 124/83, pulse 84, temperature 98.1 F (36.7 C), temperature source Oral, resp. rate 18, height  (1.626 m), weight 202 lb 9.6 oz (91.899 kg), last menstrual period 01/16/2015, unknown if currently breastfeeding.  Physical Exam  Nursing note and vitals reviewed. Constitutional: She is oriented to person, place, and time. She appears well-developed and well-nourished. No distress.  HENT:  Head: Normocephalic and atraumatic.  Eyes: Conjunctivae are normal. Right eye exhibits no discharge. Left eye exhibits no discharge. No scleral icterus.  Neck: Normal range of motion.  Cardiovascular: Normal rate, regular rhythm and normal heart sounds.   No murmur heard. Respiratory: Effort normal and breath sounds normal. No respiratory distress. She has no wheezes.  GI: Soft. Bowel sounds are normal. She exhibits no distension. There is no tenderness.  Genitourinary: Uterus normal. Cervix  exhibits no motion tenderness and no friability. No bleeding in the vagina. Vaginal discharge (moderate amount of white clumpy discharge) found.  Bilateral labia minor erythematous   Neurological: She is alert and oriented to person, place, and time.  Skin: Skin is warm and dry. She is not diaphoretic.  Psychiatric: She has a normal mood and affect. Her behavior is normal. Judgment and thought content normal.    MAU Course  Procedures Results for orders placed or performed during the hospital encounter of 04/29/15 (from the past 24 hour(s))  Urinalysis, Routine w reflex microscopic  (not at [facility])     Status: Abnormal   Collection Time: 04/29/15  8:05 AM  Result Value Ref Range   Color, Urine YELLOW YELLOW   APPearance CLOUDY (A) CLEAR   Specific Gravity, Urine 1.010 1.005 - 1.030   pH 7.0 5.0 - 8.0   Glucose, UA NEGATIVE NEGATIVE mg/dL   Hgb urine dipstick LARGE (A) NEGATIVE   Bilirubin Urine NEGATIVE NEGATIVE   Ketones, ur NEGATIVE NEGATIVE mg/dL   Protein, ur NEGATIVE NEGATIVE mg/dL   Nitrite NEGATIVE NEGATIVE   Leukocytes, UA LARGE (A) NEGATIVE  Urine microscopic-add on     Status: Abnormal   Collection Time: 04/29/15  8:05 AM  Result Value Ref Range   Squamous Epithelial / LPF 6-30 (A) NONE SEEN   WBC, UA 6-30 0 - 5 WBC/hpf   RBC / HPF TOO NUMEROUS TO COUNT 0 - 5 RBC/hpf   Bacteria, UA MANY (A) NONE SEEN   Urine-Other MUCOUS PRESENT   Pregnancy, urine POC     Status: None   Collection Time: 04/29/15  8:13 AM  Result Value Ref Range   Preg Test, Ur NEGATIVE NEGATIVE  Wet prep, genital     Status: Abnormal   Collection Time: 04/29/15  9:24 AM  Result Value Ref Range   Yeast Wet Prep HPF POC PRESENT (A) NONE SEEN   Trich, Wet Prep NONE SEEN NONE SEEN   Clue Cells Wet Prep HPF POC NONE SEEN NONE SEEN   WBC, Wet Prep HPF POC MANY (A) NONE SEEN   Sperm NONE SEEN     MDM UPT negative Will rx diflucan instead of terazol since she is already using metrogel Pt has f/u with PCP 4/4 Most recent STD testing negative Assessment and Plan  A: 1. Vaginal yeast infection     P: Discharge home Rx diflucan x 1 refill Keep f/u with PCP Switch to dove soap  Judeth Hornrin Cecile Gillispie 04/29/2015, 8:55 AM

## 2015-05-05 ENCOUNTER — Encounter: Payer: Self-pay | Admitting: Student

## 2015-05-05 ENCOUNTER — Ambulatory Visit (INDEPENDENT_AMBULATORY_CARE_PROVIDER_SITE_OTHER): Payer: Medicaid Other | Admitting: Student

## 2015-05-05 VITALS — BP 109/64 | HR 81 | Temp 98.5°F | Ht 62.0 in | Wt 199.0 lb

## 2015-05-05 DIAGNOSIS — Z0000 Encounter for general adult medical examination without abnormal findings: Secondary | ICD-10-CM | POA: Diagnosis not present

## 2015-05-05 DIAGNOSIS — Z23 Encounter for immunization: Secondary | ICD-10-CM

## 2015-05-05 NOTE — Progress Notes (Signed)
   Subjective:    Patient ID: Julia Potter, female    DOB: October 31, 1990, 25 y.o.   MRN: [id number]  HPI patient here for annual check up. She has no concern today.  # Anxiety: [facility]. Not interested in medication.   # Pap smear: 08/11/2014 normal  # Exercise: walking twice a week for about 2 hours each  # Denies smoking or recreational drug use. Drinks about 4 beers a month.  # Diet: last 24  Hour diary  BF rice cake and vitamin water  Snack: banana  Lunch: Skipped  Dinner: chicken and spinach.  # Sexual: sexually active. Reports using condoms consistently  # Dental care: never been to a dentist  # Eye: visited opthalmology last year.   Review of Systems  Constitutional: Negative for fever, weight loss, malaise/fatigue and diaphoresis.  Eyes: Negative for blurred vision.  Respiratory: Negative for cough and hemoptysis.   Cardiovascular: Negative for chest pain, palpitations, orthopnea and leg swelling.  Gastrointestinal: Negative for heartburn, abdominal pain, diarrhea, constipation and melena.  Genitourinary: Negative for dysuria.  Musculoskeletal: Negative for myalgias, back pain and joint pain.  Skin: Negative for rash.  Neurological: Negative for headaches.  Psychiatric/Behavioral: Negative for suicidal ideas, hallucinations and substance abuse.   Objective:   Physical Exam Filed Vitals:   05/05/15 1450  BP: 109/64  Pulse: 81  Temp: 98.5 F (36.9 C)  TempSrc: Oral  Height: 5\' 2"  (1.575 m)  Weight: 199 lb (90.266 kg)  SpO2: 97%   Gen: appears well Eyes: no conjunctival redness, sclera anicteric  Ears: TM's and ear canals appear normal Nares: clear, no erythema, swelling or congestion Oropharynx: clear, moist Neck: supple, no LAD CV: regular rate and rythm. S1 & S2 audible, no murmurs. Resp: no apparent work of breathing, clear to auscultation bilaterally. GI: bowel sounds normal, no tenderness to palpation, no  rebound or guarding, no mass.  GU: no suprapubic tenderness Skin: no lesion MSK: normal muscle bulk Neuro: alert and oriented, no gross deficit Psych: good affect, appropriate    Assessment & Plan:  Routine adult health maintenance Chronic conditions: -Anxiety: stable. [facility]. Declined medications in the past.  Screenings -Cervical cancer: uptodate -HIV nonreactive. Chlamydia and gonorrhea negative recently  Vaccination: -Received Tdap, HPV and Varicella today  Staying healthy:  Exercise: recommend 150 mins of moderate intensity exercise weekly and gave handout on this. Diet: portion size and regular meals. Gave handout. Eye exam: recommended eye exam once a year Dental exam: recommended dental exam twice a year   Alcohol use:  -Discussed moderation (1 drink a day)

## 2015-05-05 NOTE — Patient Instructions (Signed)
It is nice to see you today! Below are some tips about staying healthy:   Exercise Guide:

## 2015-05-05 NOTE — Assessment & Plan Note (Addendum)
Chronic conditions: -Anxiety: stable. Att[facility] a week. Declined medications in the past.  Screenings -Cervical cancer: uptodate -HIV nonreactive. Chlamydia and gonorrhea negative recently  Vaccination: -Received Tdap, HPV and meningococcal vaccine today  Staying healthy:  Exercise: recommend 150 mins of moderate intensity exercise weekly and gave handout on this. Diet: portion size and regular meals. Gave handout. Eye exam: recommended eye exam once a year Dental exam: recommended dental exam twice a year   Alcohol use:  -Discussed moderation (1 drink a day)

## 2015-05-06 DIAGNOSIS — Z23 Encounter for immunization: Secondary | ICD-10-CM | POA: Diagnosis not present

## 2015-05-06 NOTE — Addendum Note (Signed)
Addended by: Lamonte SakaiZIMMERMAN RUMPLE, APRIL D on: 05/06/2015 05:23 PM   Modules accepted: Orders

## 2015-07-10 ENCOUNTER — Encounter (HOSPITAL_COMMUNITY): Payer: Self-pay

## 2015-07-10 ENCOUNTER — Inpatient Hospital Stay (HOSPITAL_COMMUNITY)
Admission: AD | Admit: 2015-07-10 | Discharge: 2015-07-10 | Disposition: A | Discharge status: Home / Self Care | Payer: Medicaid Other | Source: Ambulatory Visit | Attending: Obstetrics & Gynecology | Admitting: Obstetrics & Gynecology

## 2015-07-10 DIAGNOSIS — R109 Unspecified abdominal pain: Secondary | ICD-10-CM | POA: Diagnosis present

## 2015-07-10 DIAGNOSIS — N898 Other specified noninflammatory disorders of vagina: Secondary | ICD-10-CM | POA: Insufficient documentation

## 2015-07-10 DIAGNOSIS — F329 Major depressive disorder, single episode, unspecified: Secondary | ICD-10-CM | POA: Diagnosis not present

## 2015-07-10 DIAGNOSIS — F419 Anxiety disorder, unspecified: Secondary | ICD-10-CM | POA: Insufficient documentation

## 2015-07-10 LAB — URINALYSIS, ROUTINE W REFLEX MICROSCOPIC
BILIRUBIN URINE: NEGATIVE
Glucose, UA: NEGATIVE mg/dL
HGB URINE DIPSTICK: NEGATIVE
Ketones, ur: NEGATIVE mg/dL
Leukocytes, UA: NEGATIVE
Nitrite: NEGATIVE
Protein, ur: NEGATIVE mg/dL
Specific Gravity, Urine: 1.01 (ref 1.005–1.030)
pH: 6.5 (ref 5.0–8.0)

## 2015-07-10 LAB — WET PREP, GENITAL
Clue Cells Wet Prep HPF POC: NONE SEEN
SPERM: NONE SEEN
Trich, Wet Prep: NONE SEEN
YEAST WET PREP: NONE SEEN

## 2015-07-10 LAB — POCT PREGNANCY, URINE: PREG TEST UR: NEGATIVE

## 2015-07-10 NOTE — MAU Provider Note (Signed)
History     CSN: [id number] Arrival date and time: 07/10/15 0744 First Provider Initiated Contact with Patient 07/10/15 [phone]      Chief Complaint  Patient presents with  . Abdominal Pain  . Vaginal Discharge   HPI Julia Potter is a 25 y.o. who presents for abdominal pain and vaginal discharge.   Reports tingling in vaginal area Reports clumpy white discharge  Abdominal pain at 1/10  Reports h/o CT in March (treated and had a negative test after treatment). She is worried about CT and wants to "just get treated today." Reports no known exposures but is "not sure about her past boyfriend"  OB History    Gravida Para Term Preterm AB TAB SAB Ectopic Multiple Living   Obstetric Comments   G1P0101 Admitted to [facility] for PIH, IOL. Delivered a viable female at 43 weeks      Past Medical History  Diagnosis Date  . ALLERGIC RHINITIS   . Herpes   . Anxiety   . Pregnancy induced hypertension   . Chlamydia   . Depression     doing ok, in therapy    Past Surgical History  Procedure Laterality Date  . Intrauterine device insertion  09/2010  . No past surgeries      Family History  Problem Relation Age of Onset  . Asthma Daughter   . Diabetes Maternal Grandmother     Social History  Substance Use Topics  . Smoking status: Never Smoker   . Smokeless tobacco: Never Used  . Alcohol Use: Yes     Comment: occas.    Allergies: No Known Allergies  Prescriptions prior to admission  Medication Sig Dispense Refill Last Dose  . metroNIDAZOLE (METROGEL) 0.75 % vaginal gel Place 1 Applicatorful vaginally daily. Twice a week 70 g 3 Past Week at Unknown time  . diphenhydrAMINE (BENADRYL) 25 MG tablet Take 25 mg by mouth at bedtime as needed for allergies.   PRN  . ibuprofen (ADVIL,MOTRIN) 200 MG tablet Take 400 mg by mouth every 6 (six) hours as needed for moderate pain.   PRN  . valACYclovir (VALTREX) 500 MG tablet TAKE 1 TABLET (500 MG TOTAL) BY MOUTH DAILY.  (Patient taking differently: TAKE 1 TABLET (500 MG TOTAL) BY MOUTH DAILY AS NEEDED FOR OUTBREAKS) 30 tablet 5 PRN    Review of Systems  Constitutional: Negative for fever and chills.  Eyes: Negative for blurred vision and double vision.  Respiratory: Negative for cough and shortness of breath.   Cardiovascular: Negative for chest pain and orthopnea.  Gastrointestinal: Negative for nausea and vomiting.  Genitourinary: Negative for dysuria, frequency and flank pain.  Musculoskeletal: Negative for myalgias.  Skin: Negative for rash.  Neurological: Negative for dizziness, tingling, weakness and headaches.  Endo/Heme/Allergies: Does not bruise/bleed easily.  Psychiatric/Behavioral: Negative for depression and suicidal ideas. The patient is not nervous/anxious.    Physical Exam   Blood pressure 130/69, pulse 81, temperature 97.5 F (36.4 C), temperature source Oral, resp. rate 18, height  (1.575 m), weight 202 lb (91.627 kg), last menstrual period 07/02/2015.  Physical Exam  Nursing note and vitals reviewed. Constitutional: She is oriented to person, place, and time. She appears well-developed and well-nourished. No distress.  HENT:  Head: Normocephalic and atraumatic.  Eyes: Conjunctivae are normal. No scleral icterus.  Neck: Normal range of motion. Neck supple.  Cardiovascular: Normal rate and intact distal pulses.  Respiratory: Effort normal. She exhibits no tenderness.  GI: Soft. There is no tenderness. There is no rebound and no guarding.  Genitourinary: Vaginal discharge (Thick white, clumped discharge) found.  Cervix is not friable and no CMT  Musculoskeletal: Normal range of motion. She exhibits no edema.  Neurological: She is alert and oriented to person, place, and time.  Skin: Skin is warm and dry. No rash noted.  Psychiatric: She has a normal mood and affect.    MAU Course  Procedures  MDM Results for orders placed or performed during the hospital encounter of  07/10/15 (from the past 24 hour(s))  Urinalysis, Routine w reflex microscopic (not at [facility])     Status: None   Collection Time: 07/10/15  7:50 AM  Result Value Ref Range   Color, Urine YELLOW YELLOW   APPearance CLEAR CLEAR   Specific Gravity, Urine 1.010 1.005 - 1.030   pH 6.5 5.0 - 8.0   Glucose, UA NEGATIVE NEGATIVE mg/dL   Hgb urine dipstick NEGATIVE NEGATIVE   Bilirubin Urine NEGATIVE NEGATIVE   Ketones, ur NEGATIVE NEGATIVE mg/dL   Protein, ur NEGATIVE NEGATIVE mg/dL   Nitrite NEGATIVE NEGATIVE   Leukocytes, UA NEGATIVE NEGATIVE  Pregnancy, urine POC     Status: None   Collection Time: 07/10/15  7:58 AM  Result Value Ref Range   Preg Test, Ur NEGATIVE NEGATIVE  Wet prep, genital     Status: Abnormal   Collection Time: 07/10/15  8:50 AM  Result Value Ref Range   Yeast Wet Prep HPF POC NONE SEEN NONE SEEN   Trich, Wet Prep NONE SEEN NONE SEEN   Clue Cells Wet Prep HPF POC NONE SEEN NONE SEEN   WBC, Wet Prep HPF POC FEW (A) NONE SEEN   Sperm NONE SEEN     Assessment and Plan   #Vaginal Discharge-- no evidence on PE that patient has acute cervicitis. - Wet prep neg - GC/CT pending - Discussed that I would not recommend treatment at this point and would recommend awaiting GC/CT results.   Isa RankinKimberly Niles [facility] 07/10/2015, 9:03 AM

## 2015-07-10 NOTE — MAU Note (Signed)
Patient has chronic BV was given a gel to use twice a week by [facility], thinks has yeast infection now, mid lower abdominal pain.

## 2015-07-13 LAB — GC/CHLAMYDIA PROBE AMP (~~LOC~~) NOT AT ARMC
CHLAMYDIA, DNA PROBE: NEGATIVE
NEISSERIA GONORRHEA: NEGATIVE

## 2015-07-14 ENCOUNTER — Telehealth: Payer: Self-pay | Admitting: *Deleted

## 2015-07-14 ENCOUNTER — Other Ambulatory Visit: Payer: Self-pay | Admitting: Student

## 2015-07-14 DIAGNOSIS — B3731 Acute candidiasis of vulva and vagina: Secondary | ICD-10-CM

## 2015-07-14 DIAGNOSIS — B373 Candidiasis of vulva and vagina: Secondary | ICD-10-CM

## 2015-07-14 MED ORDER — FLUCONAZOLE 150 MG PO TABS: 150.0000 mg | ORAL_TABLET | Freq: Once | ORAL | Status: DC

## 2015-07-14 NOTE — Telephone Encounter (Signed)
Will forward to MD to advise. Xolani Degracia,CMA  

## 2015-07-14 NOTE — Telephone Encounter (Signed)
Pt left message stating that she can see her test results in My Chart and knows she tested negative for STD but she has questions. I called pt back @ 1450 and she stated that she is on the way to the hospital for additional evaluation of her sx. I asked what she is concerned about and she stated that she does not understand why she would still be having vaginal discharge, itching and abdominal pain if everything is negative. Her LMP was 6/1 and as such, I stated that her abdominal discomfort may be from ovulation. I advised that she try taking Advil, ibuprofen, Aleve or tylenol. She may also try OTC Monistat for the vaginal itching. I advised that while her situation is uncomfortable, it is not urgent or emergent and therefore the hospital is not the most appropriate place for her evaluation. I urged her to make appt @ her Family medicine office and she stated that she had a bad experience there and so has been coming to the hospital when she has a problem. I offered pt to have appt this week in our office and will call her back with the date and time.   1525  Called pt back and left message of appt scheduled in our office on 6/16 @ 1015. Please call back if she needs to cancel the appt.

## 2015-07-14 NOTE — Telephone Encounter (Signed)
Pt was checked at the hospital for yeast infection June 9.  Pt says she was told she didn't have a yeast infection. She is still having symptons. She would like diflucan [facility] [address] Please call pt and let her know if this can be done

## 2015-07-14 NOTE — Telephone Encounter (Signed)
Sent prescription for Diflucan to her pharmacy. Please, let her know. Thanks!

## 2015-07-15 NOTE — Telephone Encounter (Signed)
LVM for pt to call back to inform her of below. Zimmerman Rumple, April D, CMA  

## 2015-07-17 ENCOUNTER — Ambulatory Visit: Payer: Medicaid Other | Admitting: Obstetrics & Gynecology

## 2015-08-25 ENCOUNTER — Encounter (HOSPITAL_COMMUNITY): Payer: Self-pay | Admitting: *Deleted

## 2015-08-25 ENCOUNTER — Inpatient Hospital Stay (HOSPITAL_COMMUNITY)
Admission: AD | Admit: 2015-08-25 | Discharge: 2015-08-25 | Disposition: A | Discharge status: Home / Self Care | Payer: Medicaid Other | Source: Ambulatory Visit | Attending: Obstetrics & Gynecology | Admitting: Obstetrics & Gynecology

## 2015-08-25 DIAGNOSIS — N946 Dysmenorrhea, unspecified: Secondary | ICD-10-CM | POA: Diagnosis not present

## 2015-08-25 DIAGNOSIS — F329 Major depressive disorder, single episode, unspecified: Secondary | ICD-10-CM | POA: Insufficient documentation

## 2015-08-25 DIAGNOSIS — R102 Pelvic and perineal pain: Secondary | ICD-10-CM | POA: Diagnosis present

## 2015-08-25 DIAGNOSIS — F419 Anxiety disorder, unspecified: Secondary | ICD-10-CM | POA: Diagnosis not present

## 2015-08-25 LAB — URINALYSIS, ROUTINE W REFLEX MICROSCOPIC
BILIRUBIN URINE: NEGATIVE
Glucose, UA: NEGATIVE mg/dL
HGB URINE DIPSTICK: NEGATIVE
Ketones, ur: NEGATIVE mg/dL
Leukocytes, UA: NEGATIVE
Nitrite: NEGATIVE
Protein, ur: NEGATIVE mg/dL
SPECIFIC GRAVITY, URINE: 1.01 (ref 1.005–1.030)
pH: 7 (ref 5.0–8.0)

## 2015-08-25 LAB — POCT PREGNANCY, URINE: PREG TEST UR: NEGATIVE

## 2015-08-25 NOTE — MAU Provider Note (Signed)
History     CSN: [id number]  Arrival date and time: 08/25/15 2053   First Provider Initiated Contact with Patient 08/25/15 2238      Chief Complaint  Patient presents with  . Pelvic Pain   Julia Potter is a 25 y.o. [id number] who presents today with cramping. She states that she had a negative home pregnancy test, and she is continuing to have cramps. She is trying to get pregnant. She states that her LMP was 08/01/15. She is worried that she is pregnant, and "something is wrong". She denies any vaginal discharge.    Abdominal Pain  This is a new problem. The current episode started 1 to 4 weeks ago. The onset quality is gradual. The problem occurs intermittently. The problem has been unchanged. The pain is located in the suprapubic region. The pain is at a severity of 5/10. The quality of the pain is cramping and sharp. The abdominal pain does not radiate. Associated symptoms include nausea. Pertinent negatives include no constipation, diarrhea, dysuria, fever, frequency or vomiting. Nothing aggravates the pain. The pain is relieved by nothing.    Past Medical History:  Diagnosis Date  . ALLERGIC RHINITIS   . Anxiety   . Chlamydia   . Depression    doing ok, in therapy  . Herpes   . Pregnancy induced hypertension     Past Surgical History:  Procedure Laterality Date  . INTRAUTERINE DEVICE INSERTION  09/2010  . NO PAST SURGERIES      Family History  Problem Relation Age of Onset  . Diabetes Maternal Grandmother   . Asthma Daughter     Social History  Substance Use Topics  . Smoking status: Never Smoker  . Smokeless tobacco: Never Used  . Alcohol use Yes     Comment: occas.    Allergies: No Known Allergies  Prescriptions Prior to Admission  Medication Sig Dispense Refill Last Dose  . diphenhydrAMINE (BENADRYL) 25 MG tablet Take 25 mg by mouth at bedtime as needed for allergies.   PRN  . fluconazole (DIFLUCAN) 150 MG tablet Take 1 tablet (150 mg total) by mouth  once. 1 tablet 0   . ibuprofen (ADVIL,MOTRIN) 200 MG tablet Take 400 mg by mouth every 6 (six) hours as needed for moderate pain.   PRN  . metroNIDAZOLE (METROGEL) 0.75 % vaginal gel Place 1 Applicatorful vaginally daily. Twice a week 70 g 3 Past Week at Unknown time  . valACYclovir (VALTREX) 500 MG tablet TAKE 1 TABLET (500 MG TOTAL) BY MOUTH DAILY. (Patient taking differently: TAKE 1 TABLET (500 MG TOTAL) BY MOUTH DAILY AS NEEDED FOR OUTBREAKS) 30 tablet 5 PRN    Review of Systems  Constitutional: Negative for chills and fever.  Gastrointestinal: Positive for abdominal pain and nausea. Negative for constipation, diarrhea and vomiting.  Genitourinary: Negative for dysuria, frequency and urgency.   Physical Exam   Blood pressure 142/84, pulse 102, temperature 98.5 F (36.9 C), temperature source Oral, resp. rate 20, height  (1.575 m), weight 207 lb 12 oz (94.2 kg), last menstrual period 08/01/2015.  Physical Exam  Nursing note and vitals reviewed. Constitutional: She is oriented to person, place, and time. She appears well-developed and well-nourished. No distress.  HENT:  Head: Normocephalic.  Cardiovascular: Normal rate.   Respiratory: Effort normal.  GI: Soft. There is no tenderness. There is no rebound.  Musculoskeletal: Normal range of motion.  Neurological: She is alert and oriented to person, place, and time.  Skin: Skin is  warm and dry.  Psychiatric: She has a normal mood and affect.     Results for orders placed or performed during the hospital encounter of 08/25/15 (from the past 24 hour(s))  Urinalysis, Routine w reflex microscopic (not at [facility])     Status: None   Collection Time: 08/25/15  9:28 PM  Result Value Ref Range   Color, Urine YELLOW YELLOW   APPearance CLEAR CLEAR   Specific Gravity, Urine 1.010 1.005 - 1.030   pH 7.0 5.0 - 8.0   Glucose, UA NEGATIVE NEGATIVE mg/dL   Hgb urine dipstick NEGATIVE NEGATIVE   Bilirubin Urine NEGATIVE NEGATIVE    Ketones, ur NEGATIVE NEGATIVE mg/dL   Protein, ur NEGATIVE NEGATIVE mg/dL   Nitrite NEGATIVE NEGATIVE   Leukocytes, UA NEGATIVE NEGATIVE   UPT is negative. Result is not crossing over into epic.  MAU Course  Procedures  MDM   Assessment and Plan   1. Menstrual cramps    DC home Comfort measures reviewed  RX: none  Return to MAU as needed   Follow-up Information    [facility] .   Specialty:  Obstetrics and Gynecology Why:  They will call you with an appointment  Contact information: [address] [place] [address] [phone]          Tawnya Crook 08/25/2015, 10:39 PM

## 2015-08-25 NOTE — Discharge Instructions (Signed)

## 2015-08-25 NOTE — MAU Note (Signed)
PT  SAYS SHE HAS  LOWER ABD  PAIN -  STARTED 7-16  -    NO   DR.     COMES   TO MAU   FOR   CARE.    IS  TRYING  TO GET  PREG-      UPT    TODAY    AT HOME   WAS  NEG.   LAST SEX-    Sunday      NO BLEEDING

## 2015-08-26 ENCOUNTER — Encounter: Payer: Self-pay | Admitting: Obstetrics and Gynecology

## 2015-09-09 ENCOUNTER — Other Ambulatory Visit: Payer: Self-pay | Admitting: Student

## 2015-09-09 DIAGNOSIS — B3731 Acute candidiasis of vulva and vagina: Secondary | ICD-10-CM

## 2015-09-09 DIAGNOSIS — B373 Candidiasis of vulva and vagina: Secondary | ICD-10-CM

## 2015-09-11 NOTE — Telephone Encounter (Signed)
Called and advised patient to schedule same-day appointment for evaluation of her abdominal pain and vaginal discharge. Patient is not on any birth control. She says she has been trying to get pregnant. She is not sure if she could be pregnant.  Patient will call the front desk office and schedule an appointment.

## 2015-09-14 ENCOUNTER — Ambulatory Visit (INDEPENDENT_AMBULATORY_CARE_PROVIDER_SITE_OTHER): Payer: Medicaid Other | Admitting: Student

## 2015-09-14 ENCOUNTER — Other Ambulatory Visit (HOSPITAL_COMMUNITY)
Admission: RE | Admit: 2015-09-14 | Discharge: 2015-09-14 | Disposition: A | Discharge status: Home / Self Care | Payer: Medicaid Other | Source: Ambulatory Visit | Attending: Family Medicine | Admitting: Family Medicine

## 2015-09-14 ENCOUNTER — Encounter: Payer: Self-pay | Admitting: Student

## 2015-09-14 VITALS — BP 132/89 | HR 91 | Temp 98.2°F | Wt 207.6 lb

## 2015-09-14 DIAGNOSIS — R1030 Lower abdominal pain, unspecified: Secondary | ICD-10-CM | POA: Diagnosis not present

## 2015-09-14 DIAGNOSIS — L680 Hirsutism: Secondary | ICD-10-CM

## 2015-09-14 DIAGNOSIS — B3731 Acute candidiasis of vulva and vagina: Secondary | ICD-10-CM

## 2015-09-14 DIAGNOSIS — B373 Candidiasis of vulva and vagina: Secondary | ICD-10-CM

## 2015-09-14 DIAGNOSIS — Z113 Encounter for screening for infections with a predominantly sexual mode of transmission: Secondary | ICD-10-CM | POA: Insufficient documentation

## 2015-09-14 DIAGNOSIS — N898 Other specified noninflammatory disorders of vagina: Secondary | ICD-10-CM | POA: Diagnosis present

## 2015-09-14 LAB — POCT WET PREP (WET MOUNT)
CLUE CELLS WET PREP WHIFF POC: NEGATIVE
TRICHOMONAS WET PREP HPF POC: ABSENT

## 2015-09-14 LAB — POCT URINE PREGNANCY: Preg Test, Ur: NEGATIVE

## 2015-09-14 LAB — TSH: TSH: 0.5 mIU/L

## 2015-09-14 NOTE — Patient Instructions (Addendum)
It was great seeing you today! We have addressed the following issues today  1. Vaginal discharge: We have done a wet prep and a test for gonorrhea and chlamydia today. We will let you know as soon as the results are back 2. Hirsutism (facial and chest hair): I have ordered some blood test (TSH and testosterone level). Keep your appointment with the OB/GYN. They might do an ultrasound of the ovary if they need to.     If we did any lab work today, and the results require attention, either me or my nurse will get in touch with you. If everything is normal, you will get a letter in mail. If you don't hear from us in two weeks, please give us a call. Otherwise, I look forward to talking with you again at our next visit. If you have any questions or concerns before then, please call the clinic at [phone].  Please bring all your medications to every doctors visit   Sign up for My Chart to have easy access to your labs results, and communication with your Primary care physician.    Please check-out at the front desk before leaving the clinic.   Take Care,

## 2015-09-14 NOTE — Progress Notes (Signed)
   Subjective:    Patient ID: Julia Potter is a 25 y.o. old female.  CC: vaginal discharge  HPI #Vaginal discharge: this has been going on for a while. She has history of recurrent BV. She is on MetroGel twice a week . She describes the discharge as white cottage cheese. Denies itching, dysuria, fever, chills or vaginal bleeding. She reports intermittent low abdominal pain that she describes as cramping It is more of cramping. Reports light spotting one a week after LMP which was on 09/02/2015. Reports regular period every 28-32 days. She is not on birth control. Last sexual intercourse is 5 days ago. She has been trying to get pregnant for about 6 months. She had a miscarriage 8 months ago.   She reports that her periods has been getting lighter and shorter. She also reports facial and chest hair.   Reports two sexual partners in the last 12 months.   PMH: reviewed SH: She denies smoking  Review of Systems Per HPI Objective:   Vitals:   09/14/15 0908  BP: 132/89  Pulse: 91  Temp: 98.2 F (36.8 C)  TempSrc: Oral  Weight: 94.2 kg (207 lb 9.6 oz)    GEN: appears well, no apparent distress. CVS: RRR, normal s1 and s2, no murmurs, no edema RESP: no increased work of breathing, good air movement bilaterally, no crackles or wheeze GI: soft, non-tender,non-distended, +BS GU: External genitalia without lesion, speculum exam remarkable for whitish to grayish thick discharge, no vaginal or cervical lesion, no bleeding noted. Bimanual exam without cervical motion tenderness. Not adnexal enlargement.  NEURO: alert and oriented appropriately, no gross defecits  PSYCH: appropriate mood and affect     Assessment & Plan:  Vaginal discharge Exam significant for whitish to grayish thick discharge. Wet prep is negative. The discharge is likely the MetroGel vaginal cream she is using versus infection. Although her presentation doesn't suggest yeast infection I will treat her with Diflucan 150  mg start. Pregnancy test is negative today. GC/CT pending.  Hirsutism Patient complains of facial and chest hair. She also says her period is getting shorter and lighter. Her BMI is 38 although weight has been stable at 208 over the course of 12 months. She says she has been trying to get pregnant for the last 6 months without success. Overall picture is concerning for PCOS.  -We will get TSH level and total testosterone levels today -She may need transvaginal ultrasound to make the diagnosis for PCOS. I have not ordered this as patient has an upcoming appointment with OB/GYN on 09/28/2015.

## 2015-09-14 NOTE — Assessment & Plan Note (Addendum)
Exam significant for whitish to grayish thick discharge. Wet prep is negative. The discharge is likely the MetroGel vaginal cream she is using versus infection. Although her presentation doesn't suggest yeast infection I will treat her with Diflucan 150 mg start. Pregnancy test is negative today. GC/CT pending.

## 2015-09-15 MED ORDER — FLUCONAZOLE 150 MG PO TABS: 150.0000 mg | ORAL_TABLET | Freq: Once | ORAL | 0 refills | Status: DC

## 2015-09-15 NOTE — Assessment & Plan Note (Signed)
Patient complains of facial and chest hair. She also says her period is getting shorter and lighter. Her BMI is 38 although weight has been stable at 208 over the course of 12 months. She says she has been trying to get pregnant for the last 6 months without success. Overall picture is concerning for PCOS.  -We will get TSH level and total testosterone levels today -She may need transvaginal ultrasound to make the diagnosis for PCOS. I have not ordered this as patient has an upcoming appointment with OB/GYN on 09/28/2015.

## 2015-09-16 LAB — CERVICOVAGINAL ANCILLARY ONLY
Chlamydia: POSITIVE — AB
NEISSERIA GONORRHEA: NEGATIVE

## 2015-09-17 ENCOUNTER — Telehealth: Payer: Self-pay | Admitting: Student

## 2015-09-17 ENCOUNTER — Ambulatory Visit (INDEPENDENT_AMBULATORY_CARE_PROVIDER_SITE_OTHER): Payer: Medicaid Other | Admitting: *Deleted

## 2015-09-17 DIAGNOSIS — A749 Chlamydial infection, unspecified: Secondary | ICD-10-CM | POA: Diagnosis not present

## 2015-09-17 MED ORDER — AZITHROMYCIN 500 MG PO TABS
1000.0000 mg | ORAL_TABLET | Freq: Once | ORAL | Status: AC
  Administered 2015-09-17: 1000 mg via ORAL

## 2015-09-17 NOTE — Telephone Encounter (Signed)
Attempted to call the patient about her test results from recent visit. Patient is positive for chlamydia. I have sent a message for my chart and advised her to return to clinic for treatment (azithromycin 2 g stat). It is also very important that her partner gett tested and treated.

## 2015-09-17 NOTE — Progress Notes (Signed)
   Patient in nurse clinic for chlamydia treatment.  Patient advised not to have sex for 7-10 days or until partner has been tested/treated.  Azithromycin 1 gm PO x 1 given by verbal order Dr. Alanda SlimGonfa.  Information reported to [employer] [facility].  Clovis PuMartin, Shaquitta Burbridge L, RN

## 2015-09-28 ENCOUNTER — Ambulatory Visit (INDEPENDENT_AMBULATORY_CARE_PROVIDER_SITE_OTHER): Payer: Medicaid Other | Admitting: Obstetrics and Gynecology

## 2015-09-28 ENCOUNTER — Encounter: Payer: Self-pay | Admitting: Obstetrics and Gynecology

## 2015-09-28 VITALS — BP 121/86 | HR 83 | Wt 210.0 lb

## 2015-09-28 DIAGNOSIS — R102 Pelvic and perineal pain: Secondary | ICD-10-CM

## 2015-09-28 NOTE — Progress Notes (Signed)
Subjective:     Julia Potter is a 25 y.o. female here for a routine exam.  Current complaints: pelvic pain since her miscarriage this past Jan. She also had 2 cycles this past month. Pain comes and goes. Dull achy in nature. Is not brought on by any certain activity. She denies any bowel or bladder dysfunction. H/O chlamydia  earlier this month. Reports has completed treatment.  Personal health questionnaire reviewed: yes.   Gynecologic History Patient's last menstrual period was 09/02/2015 (exact date). Contraception: none    Obstetric History OB History  Gravida Para Term Preterm AB Living  3 1   1 1 1   SAB TAB Ectopic Multiple Live Births  1            # Outcome Date GA Lbr Len/2nd Weight Sex Delivery Anes PTL Lv  3 Current           2 SAB           1 Preterm  1730w0d    Vag-Spont       Obstetric Comments  G[facility]P0101 [facility]. Delivered a viable female at 34 weeks     Review of Systems Pertinent items are noted in HPI.    Objective:  WDWN female Lungs clear Heart RRR Abd soft + BS obese non tender GU Nl EGBUS scant blood tinged d/c uterus small mobile non tender no adnexal tenderness or masse   Assessment:   Pelvic pain  Plan:    Will check GYN U/S

## 2015-09-28 NOTE — Progress Notes (Signed)
US scheduled for September 1st @ 0800.  Pt notified.

## 2015-10-02 ENCOUNTER — Ambulatory Visit (HOSPITAL_COMMUNITY)
Admission: RE | Admit: 2015-10-02 | Discharge: 2015-10-02 | Disposition: A | Discharge status: Home / Self Care | Payer: Medicaid Other | Source: Ambulatory Visit | Attending: Obstetrics and Gynecology | Admitting: Obstetrics and Gynecology

## 2015-10-02 DIAGNOSIS — N854 Malposition of uterus: Secondary | ICD-10-CM | POA: Diagnosis not present

## 2015-10-02 DIAGNOSIS — R102 Pelvic and perineal pain: Secondary | ICD-10-CM | POA: Diagnosis present

## 2015-10-02 DIAGNOSIS — R188 Other ascites: Secondary | ICD-10-CM | POA: Insufficient documentation

## 2015-10-07 ENCOUNTER — Ambulatory Visit (HOSPITAL_COMMUNITY)
Admission: EM | Admit: 2015-10-07 | Discharge: 2015-10-07 | Disposition: A | Discharge status: Home / Self Care | Payer: Medicaid Other | Attending: Emergency Medicine | Admitting: Emergency Medicine

## 2015-10-07 ENCOUNTER — Telehealth: Payer: Self-pay | Admitting: General Practice

## 2015-10-07 ENCOUNTER — Encounter (HOSPITAL_COMMUNITY): Payer: Self-pay | Admitting: Emergency Medicine

## 2015-10-07 ENCOUNTER — Telehealth (HOSPITAL_COMMUNITY): Payer: Self-pay | Admitting: Nurse Practitioner

## 2015-10-07 DIAGNOSIS — F329 Major depressive disorder, single episode, unspecified: Secondary | ICD-10-CM | POA: Diagnosis not present

## 2015-10-07 DIAGNOSIS — Z202 Contact with and (suspected) exposure to infections with a predominantly sexual mode of transmission: Secondary | ICD-10-CM | POA: Diagnosis not present

## 2015-10-07 DIAGNOSIS — R1030 Lower abdominal pain, unspecified: Secondary | ICD-10-CM | POA: Diagnosis present

## 2015-10-07 DIAGNOSIS — F419 Anxiety disorder, unspecified: Secondary | ICD-10-CM | POA: Diagnosis not present

## 2015-10-07 DIAGNOSIS — N898 Other specified noninflammatory disorders of vagina: Secondary | ICD-10-CM | POA: Diagnosis not present

## 2015-10-07 LAB — POCT URINALYSIS DIP (DEVICE)
BILIRUBIN URINE: NEGATIVE
GLUCOSE, UA: NEGATIVE mg/dL
Hgb urine dipstick: NEGATIVE
KETONES UR: NEGATIVE mg/dL
Leukocytes, UA: NEGATIVE
Nitrite: NEGATIVE
PROTEIN: 30 mg/dL — AB
Urobilinogen, UA: 0.2 mg/dL (ref 0.0–1.0)
pH: 6 (ref 5.0–8.0)

## 2015-10-07 LAB — POCT PREGNANCY, URINE: Preg Test, Ur: NEGATIVE

## 2015-10-07 MED ORDER — CEFTRIAXONE SODIUM 250 MG IJ SOLR
INTRAMUSCULAR | Status: AC
  Filled 2015-10-07: qty 250

## 2015-10-07 MED ORDER — CEFTRIAXONE SODIUM 250 MG IJ SOLR
250.0000 mg | Freq: Once | INTRAMUSCULAR | Status: AC
  Administered 2015-10-07: 250 mg via INTRAMUSCULAR

## 2015-10-07 MED ORDER — AZITHROMYCIN 250 MG PO TABS
1000.0000 mg | ORAL_TABLET | Freq: Once | ORAL | Status: AC
  Administered 2015-10-07: 1000 mg via ORAL

## 2015-10-07 MED ORDER — AZITHROMYCIN 250 MG PO TABS
ORAL_TABLET | ORAL | Status: AC
  Filled 2015-10-07: qty 4

## 2015-10-07 MED ORDER — LIDOCAINE HCL (PF) 1 % IJ SOLN
INTRAMUSCULAR | Status: AC
  Filled 2015-10-07: qty 2

## 2015-10-07 NOTE — ED Provider Notes (Signed)
CSN: [id number]     Arrival date & time 10/07/15  1038 History   First MD Initiated Contact with Patient 10/07/15 1117     Chief Complaint  Patient presents with  . Abdominal Pain  . Exposure to STD   (Consider location/radiation/quality/duration/timing/severity/associated sxs/prior Treatment) Ms. Julia Potter is a well-appearing 25 y.o female, presents today for vaginal discharge, vaginal odor and lower abdominal pain x 1 week. Abdominal pain is 4/10, intermittently sharp and dull. Vaginal discharge is describe as sometimes white, thick and chunky and sometimes thin in appearance. She also endorses very mild vaginal itchiness and vaginal irritation. She is sexually active with 1 partner. Her partner was tested positive for chlamydia a few days ago. She would like STD testing and would to be treated for Chlamydia as well. She denies urinary symptoms.       Past Medical History:  Diagnosis Date  . ALLERGIC RHINITIS   . Anxiety   . Chlamydia   . Depression    doing ok, in therapy  . Herpes   . Pregnancy induced hypertension    Past Surgical History:  Procedure Laterality Date  . INTRAUTERINE DEVICE INSERTION  09/2010  . NO PAST SURGERIES     Family History  Problem Relation Age of Onset  . Diabetes Maternal Grandmother   . Asthma Daughter    Social History  Substance Use Topics  . Smoking status: Never Smoker  . Smokeless tobacco: Never Used  . Alcohol use No     Comment: occas.   OB History    Gravida Para Term Preterm AB Living   3 1   1 1 1    SAB TAB Ectopic Multiple Live Births   1              Obstetric Comments   G[facility]P0101 [facility] Delivered a viable female at 34 weeks     Review of Systems  Constitutional: Negative for chills, fatigue and fever.  Respiratory: Negative for shortness of breath.   Cardiovascular: Negative for chest pain and palpitations.  Gastrointestinal: Positive for abdominal pain and nausea. Negative for diarrhea and vomiting.        Bilateral lower abdominal pain   Genitourinary: Positive for vaginal discharge. Negative for dyspareunia, dysuria, flank pain, frequency, urgency and vaginal pain.       Vag discharge is white, chunky at time and sometimes white and thin at time.   Neurological: Negative for dizziness, weakness and numbness.    Allergies  Review of patient's allergies indicates no known allergies.  Home Medications   Prior to Admission medications   Medication Sig Start Date End Date Taking? Authorizing Provider  diphenhydrAMINE (BENADRYL) 25 MG tablet Take 25 mg by mouth at bedtime as needed for allergies.    Historical Provider, MD  ibuprofen (ADVIL,MOTRIN) 200 MG tablet Take 400 mg by mouth every 6 (six) hours as needed for moderate pain.    Historical Provider, MD  metroNIDAZOLE (METROGEL) 0.75 % vaginal gel Place 1 Applicatorful vaginally daily. Twice a week 04/17/15   Raliegh IpAshly M Gottschalk, DO  valACYclovir (VALTREX) 500 MG tablet TAKE 1 TABLET (500 MG TOTAL) BY MOUTH DAILY. Patient taking differently: TAKE 1 TABLET (500 MG TOTAL) BY MOUTH DAILY AS NEEDED FOR OUTBREAKS 11/30/14   Almon Herculesaye T Gonfa, MD   Meds Ordered and Administered this Visit   Medications  cefTRIAXone (ROCEPHIN) injection 250 mg (250 mg Intramuscular Given 10/07/15 1200)  azithromycin (ZITHROMAX) tablet 1,000 mg (1,000 mg Oral  Given 10/07/15 1201)    BP 119/97 (BP Location: Left Arm)   Pulse 98   Temp 98.7 F (37.1 C) (Oral)   LMP 09/25/2015 (Exact Date)   SpO2 100%   Breastfeeding? Unknown  No data found.   Physical Exam  Constitutional: She is oriented to person, place, and time. She appears well-developed and well-nourished.  HENT:  Head: Normocephalic and atraumatic.  Cardiovascular: Normal rate, regular rhythm and normal heart sounds.   Pulmonary/Chest: Effort normal and breath sounds normal.  Abdominal: Soft. Bowel sounds are normal. She exhibits no distension. There is no tenderness.  Genitourinary:   Genitourinary Comments: Labia majora and minora symmetrical and well formed with no lesion. Vaginal canal pink, moist, and unremarkable with no lesion. Cervix pink, midline, feels smooth and firm, Cervix has a yellow is 5 mm circular firm nodule at the 2 O'clock, nodule non-tender.  Negative CMT, no adenxal tenderness, no uterine tenderness. No vaginal discharge present  Neurological: She is alert and oriented to person, place, and time.  Skin: Skin is warm and dry.  Psychiatric: She has a normal mood and affect.  Nursing note and vitals reviewed.   Urgent Care Course   Clinical Course    Procedures (including critical care time)  Labs Review Labs Reviewed  POCT URINALYSIS DIP (DEVICE) - Abnormal; Notable for the following:       Result Value   Protein, ur 30 (*)    All other components within normal limits  HIV ANTIBODY (ROUTINE TESTING)  POCT PREGNANCY, URINE  CYTOLOGY, (ORAL, ANAL, URETHRAL) ANCILLARY ONLY    Imaging Review No results found.    MDM   1. Exposure to STD   2. Vaginal discharge    Patient has no vaginal discharge on examination, did found to have an abnormal 5 mm circular firm nodule on her cervix at 2 O'clock, that is non-tender on palpation. Patient treated presumptively for Gonorrhea and Chlamydia with Azithromycin and Rocephin per request. Cytology and HIV testing pending. Patient instructed to call in 3-5 days for her test results. Patient has an OBGYN appointment this month; instructed to follow up as scheduled and mention the abnormal finding of her cervix today to her OBGYN, also instructed to have her urine recheck in 1-2 weeks due to having proteinuria in her urine. All questions were answered. Discharge instruction given. No prescription given today.    Lucia Estelle, NP 10/07/15 1225

## 2015-10-07 NOTE — ED Triage Notes (Signed)
Pt has been suffering from lower abdominal pain and vaginal discharge for one week.  Her sexual partner reports being exposed to and testing positive for chlamydia.

## 2015-10-07 NOTE — Discharge Instructions (Signed)
You have been treated today presumptively for Gonorrhea and chlamydia. Please call back in 3-5 days for lab results on your testings. Please follow up with your OBGYN as scheduled to address the abnormal finding on your cervix as discussed.

## 2015-10-07 NOTE — Telephone Encounter (Signed)
Per Dr Alysia PennaErvin, patient's ultrasound was negative- there is no gyn etiology for her pain. Called patient & informed her of results. Patient verbalized understanding & had no questions

## 2015-10-08 LAB — CERVICOVAGINAL ANCILLARY ONLY: WET PREP (BD AFFIRM): POSITIVE — AB

## 2015-10-08 LAB — HIV ANTIBODY (ROUTINE TESTING W REFLEX): HIV SCREEN 4TH GENERATION: NONREACTIVE

## 2015-10-08 LAB — CYTOLOGY, (ORAL, ANAL, URETHRAL) ANCILLARY ONLY
CHLAMYDIA, DNA PROBE: NEGATIVE
NEISSERIA GONORRHEA: NEGATIVE

## 2015-10-08 NOTE — Telephone Encounter (Signed)
Please order cervicalvaginal instead of cytology (anal, oral, urethral)

## 2015-10-13 ENCOUNTER — Telehealth (HOSPITAL_COMMUNITY): Payer: Self-pay | Admitting: Emergency Medicine

## 2015-10-13 NOTE — Telephone Encounter (Signed)
-----   Message from Lucia EstelleFeng Zheng, NP sent at 10/09/2015 11:10 AM EDT ----- Clinical staff, Please let patient know of her Cytology result. Neg for STD and Neg for HIV. Specimen is positive for BV. If she is still symptomatic, please call in Flagyl 500mg  BID x 7 days, no refill. I recommended her to see a GYN, still recommending this follow up because of her her abnormal finding on her cervix. Thank you.

## 2015-10-13 NOTE — Telephone Encounter (Signed)
Called pt and notified of recent lab results Pt ID'd properly... Reports she has appt w/GYN on 9/25 States she's still having abd pain and began to take Metrogel given to her by GYN.  Adv pt if sx are not getting better to return or to f/u w/PCP Pt verb understanding.

## 2015-10-26 ENCOUNTER — Ambulatory Visit (INDEPENDENT_AMBULATORY_CARE_PROVIDER_SITE_OTHER): Payer: Medicaid Other | Admitting: Obstetrics and Gynecology

## 2015-10-26 ENCOUNTER — Encounter: Payer: Self-pay | Admitting: Obstetrics and Gynecology

## 2015-10-26 VITALS — BP 125/88 | HR 115 | Ht 62.0 in | Wt 208.3 lb

## 2015-10-26 DIAGNOSIS — R102 Pelvic and perineal pain: Secondary | ICD-10-CM | POA: Diagnosis not present

## 2015-10-26 DIAGNOSIS — Z8742 Personal history of other diseases of the female genital tract: Secondary | ICD-10-CM

## 2015-10-29 NOTE — Progress Notes (Signed)
Routine Gyn Exam Patient here for follow up of her vaginal discharge and pelvis pain. She has completed her medication and her vaginal discharge has resolved. GYN U/S has been completed and was unremarkable. Pt reports pain is better as well.    Gynecologic History  Obstetric History OB History  Gravida Para Term Preterm AB Living  3 1   1 1 1   SAB TAB Ectopic Multiple Live Births  1            # Outcome Date GA Lbr Len/2nd Weight Sex Delivery Anes PTL Lv  3 Gravida           2 SAB           1 Preterm  7063w0d    Vag-Spont       Obstetric Comments  G1P0101 [facility]. Delivered a viable female at 34 weeks   PE AF VSS Lungs clear Heart RRR Abd soft + BS   A/P Pelvic pain, resolved Vaginal discharge, resolved  Discussed OCP's to help keep cycles regular and control of dysmenorrhea. Pt declines due to desire of pregnancy. Pt instructed to start PNV. Advised follow up as needed

## 2015-11-17 ENCOUNTER — Ambulatory Visit (INDEPENDENT_AMBULATORY_CARE_PROVIDER_SITE_OTHER): Payer: Medicaid Other | Admitting: *Deleted

## 2015-11-17 DIAGNOSIS — Z111 Encounter for screening for respiratory tuberculosis: Secondary | ICD-10-CM

## 2015-11-17 NOTE — Progress Notes (Signed)
   PPD placed Left Forearm.  Pt to return 11/19/2015 for reading.  Pt tolerated intradermal injection. Clovis PuMartin, Concettina Leth L, RN

## 2015-11-19 ENCOUNTER — Encounter: Payer: Self-pay | Admitting: *Deleted

## 2015-11-19 ENCOUNTER — Ambulatory Visit (INDEPENDENT_AMBULATORY_CARE_PROVIDER_SITE_OTHER): Payer: Medicaid Other | Admitting: *Deleted

## 2015-11-19 DIAGNOSIS — Z111 Encounter for screening for respiratory tuberculosis: Secondary | ICD-10-CM

## 2015-11-19 LAB — TB SKIN TEST
INDURATION: 0 mm
TB SKIN TEST: NEGATIVE

## 2015-11-19 NOTE — Progress Notes (Signed)
   PPD Reading Note PPD read and results entered in EpicCare. Result: 0 mm induration. Interpretation: Negative If test not read within 48-72 hours of initial placement, patient advised to repeat in other arm 1-3 weeks after this test. Allergic reaction: no  Martin, Tamika L, RN  

## 2015-12-02 ENCOUNTER — Other Ambulatory Visit (HOSPITAL_COMMUNITY)
Admission: RE | Admit: 2015-12-02 | Discharge: 2015-12-02 | Disposition: A | Discharge status: Home / Self Care | Payer: Medicaid Other | Source: Ambulatory Visit | Attending: Family Medicine | Admitting: Family Medicine

## 2015-12-02 ENCOUNTER — Ambulatory Visit (INDEPENDENT_AMBULATORY_CARE_PROVIDER_SITE_OTHER): Payer: Medicaid Other | Admitting: Internal Medicine

## 2015-12-02 ENCOUNTER — Encounter: Payer: Self-pay | Admitting: Internal Medicine

## 2015-12-02 VITALS — BP 124/72 | HR 100 | Temp 97.5°F | Wt 217.0 lb

## 2015-12-02 DIAGNOSIS — Z113 Encounter for screening for infections with a predominantly sexual mode of transmission: Secondary | ICD-10-CM | POA: Diagnosis not present

## 2015-12-02 DIAGNOSIS — N898 Other specified noninflammatory disorders of vagina: Secondary | ICD-10-CM

## 2015-12-02 LAB — POCT WET PREP (WET MOUNT)
CLUE CELLS WET PREP WHIFF POC: NEGATIVE
TRICHOMONAS WET PREP HPF POC: ABSENT

## 2015-12-02 NOTE — Progress Notes (Signed)
   [facility] Noralee CharsAsiyah Larinda Herter, MD Phone: [phone]  Reason For Visit: SDA for Vaginal Discharge   # Vaginal Discharge  - vaginal discharge, white discharge - Sometimes has ordor  - No burning or itching  - No fever or chills, nausea or vomiting   - Sexual active, 1 partner, no condoms - No birth control - trying to get pregnant  - Hx of BV and yeast infection  - Has been using Metrogel twice weekly for BV  - Hx of herpes - States she has not had period since September - now having spotting   Past Medical History Reviewed problem list.  Medications- reviewed and updated No additions to family history Social history- patient is a non-smoker  Objective: BP 124/72   Pulse 100   Temp 97.5 F (36.4 C) (Oral)   Wt 217 lb (98.4 kg)   LMP 10/27/2015   SpO2 100%   BMI 39.69 kg/m  Gen: NAD, alert, cooperative with exam Cardio: regular rate and rhythm, S1S2 heard, no murmurs appreciated Pulm: clear to auscultation bilaterally, no wheezes, rhonchi or rales GI: soft, non-tender, non-distended, bowel sounds present, GU: external vaginal tissue normal with blood in vaginal vault, cervix wnl, bloody discharge from cervical os,no cervical motion tenderness, no abdominal/ adnexal masses, nabothian cyst noted on cervix  Assessment/Plan: See problem based a/p  Vaginal discharge No vaginal discharge noted on exam, patient appeared to be having a menstrual period on examination. Recent HIV and RPR testing on September 9th, 2017 negative. Possibly vaginal discharge related to metrogel patient has been taking twice weekly, no signs of infection on exam  - Wet prep, G/C - Follow with results  - Holding off on pregnancy test as patient with menstrual period

## 2015-12-02 NOTE — Patient Instructions (Signed)
I will let you know the results of your labs as soon as possible. I don't see anything concerning on exam for infection. Please follow-up as needed.

## 2015-12-02 NOTE — Assessment & Plan Note (Signed)
No vaginal discharge noted on exam, patient appeared to be having a menstrual period on examination. Recent HIV and RPR testing on September 9th, 2017 negative. Possibly vaginal discharge related to metrogel patient has been taking twice weekly, no signs of infection on exam  - Wet prep, G/C - Follow with results  - Holding off on pregnancy test as patient with menstrual period

## 2015-12-04 LAB — CERVICOVAGINAL ANCILLARY ONLY
CHLAMYDIA, DNA PROBE: NEGATIVE
NEISSERIA GONORRHEA: NEGATIVE

## 2015-12-07 ENCOUNTER — Telehealth: Payer: Self-pay | Admitting: Internal Medicine

## 2015-12-07 NOTE — Telephone Encounter (Signed)
Called patient, let voicemail letting her know that her work up for vaginal discharge was completely negative.

## 2015-12-28 ENCOUNTER — Other Ambulatory Visit: Payer: Self-pay | Admitting: Student

## 2015-12-28 DIAGNOSIS — B3731 Acute candidiasis of vulva and vagina: Secondary | ICD-10-CM

## 2015-12-28 DIAGNOSIS — B373 Candidiasis of vulva and vagina: Secondary | ICD-10-CM

## 2015-12-30 ENCOUNTER — Ambulatory Visit (HOSPITAL_COMMUNITY)
Admission: EM | Admit: 2015-12-30 | Discharge: 2015-12-30 | Disposition: A | Discharge status: Home / Self Care | Payer: Medicaid Other | Attending: Internal Medicine | Admitting: Internal Medicine

## 2015-12-30 ENCOUNTER — Encounter (HOSPITAL_COMMUNITY): Payer: Self-pay | Admitting: Emergency Medicine

## 2015-12-30 DIAGNOSIS — Z79899 Other long term (current) drug therapy: Secondary | ICD-10-CM | POA: Insufficient documentation

## 2015-12-30 DIAGNOSIS — B373 Candidiasis of vulva and vagina: Secondary | ICD-10-CM | POA: Insufficient documentation

## 2015-12-30 DIAGNOSIS — N898 Other specified noninflammatory disorders of vagina: Secondary | ICD-10-CM | POA: Insufficient documentation

## 2015-12-30 DIAGNOSIS — R109 Unspecified abdominal pain: Secondary | ICD-10-CM | POA: Insufficient documentation

## 2015-12-30 MED ORDER — FLUCONAZOLE 150 MG PO TABS: 150.0000 mg | ORAL_TABLET | Freq: Every day | ORAL | 0 refills | Status: DC

## 2015-12-30 NOTE — ED Provider Notes (Signed)
CSN: [id number]     Arrival date & time 12/30/15  1701 History   None    Chief Complaint  Patient presents with  . Abdominal Pain   (Consider location/radiation/quality/duration/timing/severity/associated sxs/prior Treatment) Patient c/o vaginal discharge.  She has hx of chronic bv and is on treatment for this.  She has vaginal itching and white DC.     The history is provided by the patient.  Abdominal Pain  Pain location:  Generalized Pain radiates to:  Does not radiate Pain severity:  No pain Onset quality:  Sudden Progression:  Worsening Relieved by:  Nothing Worsened by:  Nothing Ineffective treatments:  OTC medications Associated symptoms: vaginal discharge     Past Medical History:  Diagnosis Date  . ALLERGIC RHINITIS   . Anxiety   . Chlamydia   . Depression    doing ok, in therapy  . Herpes   . Pregnancy induced hypertension    Past Surgical History:  Procedure Laterality Date  . INTRAUTERINE DEVICE INSERTION  09/2010  . NO PAST SURGERIES     Family History  Problem Relation Age of Onset  . Diabetes Maternal Grandmother   . Asthma Daughter    Social History  Substance Use Topics  . Smoking status: Never Smoker  . Smokeless tobacco: Never Used  . Alcohol use Yes     Comment: occas.   OB History    Gravida Para Term Preterm AB Living   3 1   1 1 1    SAB TAB Ectopic Multiple Live Births   1              Obstetric Comments   G[facility]P0101 [facility]. Delivered a viable female at 34 weeks     Review of Systems  Constitutional: Negative.   HENT: Negative.   Eyes: Negative.   Respiratory: Negative.   Cardiovascular: Negative.   Gastrointestinal: Positive for abdominal pain.  Endocrine: Negative.   Genitourinary: Positive for vaginal discharge.  Skin: Negative.   Allergic/Immunologic: Negative.   Neurological: Negative.   Hematological: Negative.   Psychiatric/Behavioral: Negative.     Allergies  Patient has no known  allergies.  Home Medications   Prior to Admission medications   Medication Sig Start Date End Date Taking? Authorizing Provider  diphenhydrAMINE (BENADRYL) 25 MG tablet Take 25 mg by mouth at bedtime as needed for allergies.    Historical Provider, MD  fluconazole (DIFLUCAN) 150 MG tablet TAKE ONE TABLET BY MOUTH ONCE 12/28/15   Almon Herculesaye T Gonfa, MD  fluconazole (DIFLUCAN) 150 MG tablet Take 1 tablet (150 mg total) by mouth daily. 12/30/15   Deatra CanterWilliam J Oxford, FNP  ibuprofen (ADVIL,MOTRIN) 200 MG tablet Take 400 mg by mouth every 6 (six) hours as needed for moderate pain.    Historical Provider, MD  valACYclovir (VALTREX) 500 MG tablet TAKE 1 TABLET (500 MG TOTAL) BY MOUTH DAILY. Patient taking differently: TAKE 1 TABLET (500 MG TOTAL) BY MOUTH DAILY AS NEEDED FOR OUTBREAKS 11/30/14   Almon Herculesaye T Gonfa, MD   Meds Ordered and Administered this Visit  Medications - No data to display  BP 128/83   Pulse 100   Temp 98.9 F (37.2 C) (Oral)   Resp 16   Ht 5\' 2"  (1.575 m)   Wt 200 lb (90.7 kg)   LMP 12/02/2015   SpO2 100%   BMI 36.58 kg/m  No data found.   Physical Exam  Constitutional: She is oriented to person, place, and time. She  appears well-developed and well-nourished.  HENT:  Head: Normocephalic and atraumatic.  Eyes: EOM are normal. Pupils are equal, round, and reactive to light.  Cardiovascular: Normal rate and regular rhythm.   Pulmonary/Chest: Effort normal and breath sounds normal.  Genitourinary: Vaginal discharge found.  Genitourinary Comments: Vaginal vault and cervix covered with white curdish discharge. Cervix - NO CMT Adnexa - No tenderness.  Neurological: She is alert and oriented to person, place, and time.  Nursing note and vitals reviewed.   Urgent Care Course   Clinical Course     Procedures (including critical care time)  Labs Review Labs Reviewed  CERVICOVAGINAL ANCILLARY ONLY    Imaging Review No results found.   Visual Acuity Review  Right Eye  Distance:   Left Eye Distance:   Bilateral Distance:    Right Eye Near:   Left Eye Near:    Bilateral Near:         MDM   1. Vaginal discharge    2. Vaginal Candidiasis - Diflucan 150mg  po qd x 5 days #5    Deatra CanterWilliam J Oxford, FNP 12/30/15 1839

## 2015-12-30 NOTE — ED Triage Notes (Signed)
PT reports sharp pelvic pain and vaginal discharge, discomfort, and itching. PT reports she struggles with yeast infections and BV. PT is supposed to be on a six month course of treatment for BV, but has not been using it. PT reports she has tried OTC yeast treatment and she took a diflucan yesterday.

## 2015-12-31 LAB — CERVICOVAGINAL ANCILLARY ONLY
Chlamydia: NEGATIVE
Neisseria Gonorrhea: NEGATIVE

## 2016-01-01 LAB — CERVICOVAGINAL ANCILLARY ONLY: Wet Prep (BD Affirm): POSITIVE — AB

## 2016-01-15 ENCOUNTER — Telehealth (HOSPITAL_COMMUNITY): Payer: Self-pay | Admitting: Emergency Medicine

## 2016-01-15 NOTE — Telephone Encounter (Signed)
Notes Recorded by Eustace MooreLaura W Murray, MD on 01/03/2016 at 7:54 PM EST Test for gardnerella (bacterial vaginosis) was positive. This only needs to be treated if patient is having symptoms such vaginal irritation/discharge. If she is having vaginal irritation/discharge, pt should followup with the provider who manages her chronic bacterial vaginosis for further discussion of management options. Result note sent to patient's MyChart. LM   Called pt and notified of recent lab results from visit 11/29 Pt ID'd properly... Reports feeling better and sx have subsided Adv pt if sx are not getting better to return or to f/u w/PCP Pt verb understanding.

## 2016-01-20 ENCOUNTER — Encounter: Payer: Self-pay | Admitting: Obstetrics and Gynecology

## 2016-01-20 ENCOUNTER — Ambulatory Visit (INDEPENDENT_AMBULATORY_CARE_PROVIDER_SITE_OTHER): Payer: Medicaid Other | Admitting: Obstetrics and Gynecology

## 2016-01-20 DIAGNOSIS — N939 Abnormal uterine and vaginal bleeding, unspecified: Secondary | ICD-10-CM | POA: Diagnosis present

## 2016-01-20 HISTORY — DX: Abnormal uterine and vaginal bleeding, unspecified: N93.9

## 2016-01-20 LAB — POCT PREGNANCY, URINE: PREG TEST UR: NEGATIVE

## 2016-01-20 MED ORDER — DESOGESTREL-ETHINYL ESTRADIOL 0.15-30 MG-MCG PO TABS
1.0000 | ORAL_TABLET | Freq: Every day | ORAL | 11 refills | Status: DC
Start: 2016-01-20 — End: 2016-07-21

## 2016-01-20 NOTE — Progress Notes (Signed)
Pt still having irregular cycles since her missed AB in Jan. She has had a normal GYN U/S. Has declined OCP's for cycle regulation in the past   PE AF VSS Lungs clear  Heart RRR Abd soft + BS  UPT negative  A/P AUB Discussed treatment options with pt. Recommend OCP's for cycle regulation. Pt is agreeable to this treatment. U/R/B reviewed with pt. Pt to start Sunday after next cycle.

## 2016-03-17 ENCOUNTER — Encounter: Payer: Self-pay | Admitting: Obstetrics and Gynecology

## 2016-03-23 ENCOUNTER — Telehealth: Payer: Self-pay | Admitting: *Deleted

## 2016-03-23 MED ORDER — NORETHIN ACE-ETH ESTRAD-FE 1-20 MG-MCG(24) PO TABS: 1.0000 | ORAL_TABLET | Freq: Every day | ORAL | 11 refills | Status: DC

## 2016-03-23 NOTE — Telephone Encounter (Addendum)
Pt left message yesterday @ 1442 stating that she has been having side effects from the birth control medicine and wants to know what she should do or if there is anything else she can take. She has been having severe headaches and nausea. Consult with Dr. Alysia PennaErvin who prescribed alternate birth control. Called pt and left message on her voice mail that a new prescription has been sent to her. Pt called back immediately and I explained the plan of care for alternate birth control Rx.  Pt stated that she had elevated BP several days ago while having a headache and she stopped taking the Apri birth control. I advised that if she has elevated BP once the new Rx has been started to contact our office for appt. Pt voiced understanding.

## 2016-05-10 ENCOUNTER — Telehealth: Payer: Self-pay | Admitting: Student

## 2016-05-10 NOTE — Telephone Encounter (Signed)
Pt would like TB test results printed and left up front. Her TB test was 11/19/15.  She needs this for her job

## 2016-05-10 NOTE — Telephone Encounter (Signed)
TB skin test results left up front for pick up.  Clovis Pu, RN

## 2016-05-11 ENCOUNTER — Other Ambulatory Visit: Payer: Self-pay | Admitting: Student

## 2016-05-11 ENCOUNTER — Other Ambulatory Visit: Payer: Self-pay | Admitting: Family Medicine

## 2016-05-13 ENCOUNTER — Encounter (HOSPITAL_COMMUNITY): Payer: Self-pay | Admitting: Emergency Medicine

## 2016-05-13 ENCOUNTER — Ambulatory Visit (HOSPITAL_COMMUNITY)
Admission: EM | Admit: 2016-05-13 | Discharge: 2016-05-13 | Disposition: A | Discharge status: Home / Self Care | Payer: Medicaid Other | Attending: Family Medicine | Admitting: Family Medicine

## 2016-05-13 DIAGNOSIS — Z3202 Encounter for pregnancy test, result negative: Secondary | ICD-10-CM | POA: Insufficient documentation

## 2016-05-13 DIAGNOSIS — N898 Other specified noninflammatory disorders of vagina: Secondary | ICD-10-CM | POA: Diagnosis not present

## 2016-05-13 DIAGNOSIS — B9689 Other specified bacterial agents as the cause of diseases classified elsewhere: Secondary | ICD-10-CM | POA: Insufficient documentation

## 2016-05-13 DIAGNOSIS — N760 Acute vaginitis: Secondary | ICD-10-CM | POA: Diagnosis not present

## 2016-05-13 LAB — POCT URINALYSIS DIP (DEVICE)
BILIRUBIN URINE: NEGATIVE
GLUCOSE, UA: NEGATIVE mg/dL
Hgb urine dipstick: NEGATIVE
Ketones, ur: NEGATIVE mg/dL
Leukocytes, UA: NEGATIVE
Nitrite: NEGATIVE
PH: 6.5 (ref 5.0–8.0)
PROTEIN: NEGATIVE mg/dL
SPECIFIC GRAVITY, URINE: 1.025 (ref 1.005–1.030)
Urobilinogen, UA: 0.2 mg/dL (ref 0.0–1.0)

## 2016-05-13 LAB — POCT PREGNANCY, URINE: PREG TEST UR: NEGATIVE

## 2016-05-13 NOTE — ED Provider Notes (Signed)
[facility]    CSN: [id number] Arrival date & time: 05/13/16  1549     History   Chief Complaint Chief Complaint  Patient presents with  . Abdominal Pain  . Vaginal Discharge    HPI Julia Potter is a 26 y.o. female with a history of chronic BV presenting for vaginal discharge and lower abdominal pain.   She has had 3 days of intermittent thick white vaginal discharge which is irritating, causing itching which has improved with taking diflucan  po daily x2 days. During this time she's also reported lower pelvic cramping intermittently that has no aggravating or alleviating factors. She's also been taking metrogel twice weekly as per her routine for chronic BV.   She reports generally irregular periods for which she takes OCPs to regulate the cycle. LMP was in Feb 2018. She is sexually active with a single female partner and does not use barrier contraception. He has no symptoms.   HPI  Past Medical History:  Diagnosis Date  . ALLERGIC RHINITIS   . Anxiety   . Chlamydia   . Depression    doing ok, in therapy  . Herpes   . Pregnancy induced hypertension     Patient Active Problem List   Diagnosis Date Noted  . Abnormal uterine bleeding (AUB) 01/20/2016  . H/O vaginal discharge 10/26/2015  . Hirsutism 09/14/2015  . Routine adult health maintenance 05/05/2015  . Psychiatric illness 11/01/2013    Past Surgical History:  Procedure Laterality Date  . INTRAUTERINE DEVICE INSERTION  09/2010  . NO PAST SURGERIES      OB History    Gravida Para Term Preterm AB Living   SAB TAB Ectopic Multiple Live Births   1              Obstetric Comments   [id number] Admitted to [facility] for PIH, IOL. Delivered a viable female at 40 weeks       Home Medications    Prior to Admission medications   Medication Sig Start Date End Date Taking? Authorizing Provider  metroNIDAZOLE (METROGEL) 0.75 % vaginal gel PLACE 1 APPLICATORFUL VAGINALLY DAILY. TWICE A  WEEK 05/12/16  Yes Almon Hercules, MD  desogestrel-ethinyl estradiol (APRI) 0.15-30 MG-MCG tablet Take 1 tablet by mouth daily. 01/20/16   Hermina Staggers, MD  diphenhydrAMINE (BENADRYL) 25 MG tablet Take 25 mg by mouth at bedtime as needed for allergies.    Historical Provider, MD  ibuprofen (ADVIL,MOTRIN) 200 MG tablet Take 400 mg by mouth every 6 (six) hours as needed for moderate pain.    Historical Provider, MD  Norethindrone Acetate-Ethinyl Estrad-FE (LOESTRIN 24 FE) 1-20 MG-MCG(24) tablet Take 1 tablet by mouth daily. 03/23/16   Hermina Staggers, MD  valACYclovir (VALTREX) 500 MG tablet TAKE 1 TABLET (500 MG TOTAL) BY MOUTH DAILY. 05/12/16   Almon Hercules, MD    Family History Family History  Problem Relation Age of Onset  . Diabetes Maternal Grandmother   . Asthma Daughter     Social History Social History  Substance Use Topics  . Smoking status: Never Smoker  . Smokeless tobacco: Never Used  . Alcohol use Yes     Comment: 2 per week     Allergies   Patient has no known allergies.   Review of Systems Review of Systems Per HPI. No fevers, chills, VB, dysuria, N/V/D.   Physical Exam Triage Vital Signs ED Triage Vitals  Enc  Vitals Group     BP 05/13/16 1607 (!) 128/91     Pulse Rate 05/13/16 1607 89     Resp 05/13/16 1607 16     Temp 05/13/16 1607 98.3 F (36.8 C)     Temp Source 05/13/16 1607 Oral     SpO2 05/13/16 1607 99 %     Weight 05/13/16 1603 200 lb (90.7 kg)     Height 05/13/16 1603  (1.575 m)     Head Circumference --      Peak Flow --      Pain Score 05/13/16 1603 0     Pain Loc --      Pain Edu? --      Excl. in GC? --    No data found.   Updated Vital Signs BP (!) 128/91   Pulse 89   Temp 98.3 F (36.8 C) (Oral)   Resp 16   Ht  (1.575 m)   Wt 200 lb (90.7 kg)   LMP 03/06/2016   SpO2 99%   BMI 36.58 kg/m   Visual Acuity Right Eye Distance:   Left Eye Distance:   Bilateral Distance:    Right Eye Near:   Left Eye Near:      Bilateral Near:     Physical Exam BP (!) 128/91   Pulse 89   Temp 98.3 F (36.8 C) (Oral)   Resp 16   Ht  (1.575 m)   Wt 200 lb (90.7 kg)   LMP 03/06/2016   SpO2 99%   BMI 36.58 kg/m  Gen: Well-appearing 25 y.o.female in NAD GI: Normoactive BS; soft, non-tender, non-distended, no organomegaly, no hernia appreciated Pelvic: Normal external female genitalia without lesions. Vaginal mucosa and cervix normal without lesions or bleeding noted on speculum exam. There is significant thick white chalky discharge. No cervical motion tenderness.   Soledad Gerlach, RN present throughout duration of exam.   UC Treatments / Results  Labs (all labs ordered are listed, but only abnormal results are displayed) Labs Reviewed  POCT URINALYSIS DIP (DEVICE)  POCT PREGNANCY, URINE  CERVICOVAGINAL ANCILLARY ONLY    EKG  EKG Interpretation None       Radiology No results found.  Procedures Procedures (including critical care time)  Medications Ordered in UC Medications - No data to display   Initial Impression / Assessment and Plan / UC Course  I have reviewed the triage vital signs and the nursing notes.  Pertinent labs & imaging results that were available during my care of the patient were reviewed by me and considered in my medical decision making (see chart for details).   Final Clinical Impressions(s) / UC Diagnoses   Final diagnoses:  Vaginal discharge  Bacterial vaginosis   26 y.o. female with chronic BV presenting for vaginal discharge and irritation consistent with vulvovaginal candidiasis for which she has already taken diflucan  po daily for the past 2 days. ?candidiasis related to chronic metronidazole administration. Suspect lower abdominal cramping is related to irregular menses. UPT negative, UA negative. Has history of negative U/S.  - Continue OCPs.  - Continue metrogel twice weekly - Will defer further treatment based on results of swab  collected today. If +BV, would consider oral therapy with post-therapy diflucan. If this fails, consider tinidazole, though this is non-preferred on medicaid formulary. - Return precautions reviewed  New Prescriptions New Prescriptions   No medications on file     Tyrone Nine, MD 05/13/16 1645

## 2016-05-13 NOTE — ED Triage Notes (Signed)
PT reports lower abdominal cramping pain for 3 days. PT reports white chunky vaginal discharge for 3 days as well. PT reports intermittent vaginal itching. PT has chronic BV and is on antibiotics 2x per week. PT's LMP was in Feb and PT has had unprotected sex.

## 2016-05-16 ENCOUNTER — Telehealth (HOSPITAL_COMMUNITY): Payer: Self-pay | Admitting: Internal Medicine

## 2016-05-16 LAB — CERVICOVAGINAL ANCILLARY ONLY
Bacterial vaginitis: NEGATIVE
CHLAMYDIA, DNA PROBE: NEGATIVE
Candida vaginitis: POSITIVE — AB
Neisseria Gonorrhea: NEGATIVE
Trichomonas: NEGATIVE

## 2016-05-16 MED ORDER — FLUCONAZOLE 200 MG PO TABS: 200.0000 mg | ORAL_TABLET | Freq: Every day | ORAL | 0 refills | Status: AC

## 2016-05-16 NOTE — Telephone Encounter (Signed)
Clinical staff, please let patient know that test for candida (yeast) was positive.  Rx fluconazole was sent to the pharmacy of record, [facility].  Recheck or followup with PCP for further evaluation if symptoms are not improving.  LM

## 2016-05-30 ENCOUNTER — Telehealth: Payer: Self-pay | Admitting: *Deleted

## 2016-05-30 NOTE — Telephone Encounter (Signed)
Returned call. There was no answer. Message left stating I am returning her phone call, please return my call at the clinic.

## 2016-05-30 NOTE — Telephone Encounter (Signed)
Patient returned call. She is concerned because she started on birth control pills to regulate her cycles but she has not had a period since February. Took a pregnancy test two weeks ago which was negative. I advised that she needs to come in to get a pregnancy test, but if she is not pregnant and she is not happy with the current pills she should come in for an appointment to see a provider. Patient voiced understanding and requested to be transferred to front desk to make an appointment.

## 2016-05-30 NOTE — Telephone Encounter (Signed)
Patient left message on nurse voicemail on 05/30/16 at 0920.  Patient states she was put on birth control to regulate her periods.  States she hasn't had a period in 2 months.  Would like a return call to [phone].

## 2016-06-24 ENCOUNTER — Ambulatory Visit: Payer: Medicaid Other | Admitting: Obstetrics and Gynecology

## 2016-07-21 ENCOUNTER — Ambulatory Visit (INDEPENDENT_AMBULATORY_CARE_PROVIDER_SITE_OTHER): Payer: Medicaid Other | Admitting: Obstetrics and Gynecology

## 2016-07-21 ENCOUNTER — Encounter: Payer: Self-pay | Admitting: Obstetrics and Gynecology

## 2016-07-21 DIAGNOSIS — N939 Abnormal uterine and vaginal bleeding, unspecified: Secondary | ICD-10-CM | POA: Diagnosis not present

## 2016-07-21 MED ORDER — GLYCOPYRROLATE 2 MG PO TABS: 2.0000 mg | ORAL_TABLET | Freq: Three times a day (TID) | ORAL | 3 refills | Status: DC | PRN

## 2016-07-21 NOTE — Patient Instructions (Signed)
Diet for Polycystic Ovarian Syndrome Polycystic ovary syndrome (PCOS) is a disorder of the chemical messengers (hormones) that regulate menstruation. The condition causes important hormones to be out of balance. PCOS can:  Make your periods irregular or stop.  Cause cysts to develop on the ovaries.  Make it difficult to get pregnant.  Stop your body from responding to the effects of insulin (insulin resistance), which can lead to obesity and diabetes.  Changing what you eat can help manage PCOS and improve your health. It can help you lose weight and improve the way your body uses insulin. What is my plan?  Eat breakfast, lunch, and dinner plus two snacks every day.  Include protein in each meal and snack.  Choose whole grains instead of products made with refined flour.  Eat a variety of foods.  Exercise regularly as told by your health care provider. What do I need to know about this eating plan? If you are overweight or obese, pay attention to how many calories you eat. Cutting down on calories can help you lose weight. Work with your health care provider or dietitian to figure out how many calories you need each day. What foods can I eat? Grains Whole grains, such as whole wheat. Whole-grain breads, crackers, cereals, and pasta. Unsweetened oatmeal, bulgur, barley, quinoa, or brown rice. Corn or whole-wheat flour tortillas. Vegetables  Lettuce. Spinach. Peas. Beets. Cauliflower. Cabbage. Broccoli. Carrots. Tomatoes. Squash. Eggplant. Herbs. Peppers. Onions. Cucumbers. Brussels sprouts. Fruits Berries. Bananas. Apples. Oranges. Grapes. Papaya. Mango. Pomegranate. Kiwi. Grapefruit. Cherries. Meats and Other Protein Sources Lean proteins, such as fish, chicken, beans, eggs, and tofu. Dairy Low-fat dairy products, such as skim milk, cheese sticks, and yogurt. Beverages Low-fat or fat-free drinks, such as water, low-fat milk, sugar-free drinks, and 100% fruit  juice. Condiments Ketchup. Mustard. Barbecue sauce. Relish. Low-fat or fat-free mayonnaise. Fats and Oils Olive oil or canola oil. Walnuts and almonds. The items listed above may not be a complete list of recommended foods or beverages. Contact your dietitian for more options. What foods are not recommended? Foods high in calories or fat. Fried foods. Sweets. Products made from refined white flour, including white bread, pastries, white rice, and pasta. The items listed above may not be a complete list of foods and beverages to avoid. Contact your dietitian for more information. This information is not intended to replace advice given to you by your health care provider. Make sure you discuss any questions you have with your health care provider. Document Released: 05/11/2015 Document Revised: 06/25/2015 Document Reviewed: 01/29/2014 Elsevier Interactive Patient Education  2018 Elsevier Inc.  Polycystic Ovarian Syndrome Polycystic ovarian syndrome (PCOS) is a common hormonal disorder among women of reproductive age. In most women with PCOS, many small fluid-filled sacs (cysts) grow on the ovaries, and the cysts are not part of a normal menstrual cycle. PCOS can cause problems with your menstrual periods and make it difficult to get pregnant. It can also cause an increased risk of miscarriage with pregnancy. If it is not treated, PCOS can lead to serious health problems, such as diabetes and heart disease. What are the causes? The cause of PCOS is not known, but it may be the result of a combination of certain factors, such as:  Irregular menstrual cycle.  High levels of certain hormones (androgens).  Problems with the hormone that helps to control blood sugar (insulin resistance).  Certain genes.  What increases the risk? This condition is more likely to develop in women who   have a family history of PCOS. What are the signs or symptoms? Symptoms of PCOS may include:  Multiple ovarian  cysts.  Infrequent periods or no periods.  Periods that are too frequent or too heavy.  Unpredictable periods.  Inability to get pregnant (infertility) because of not ovulating.  Increased growth of hair on the face, chest, stomach, back, thumbs, thighs, or toes.  Acne or oily skin. Acne may develop during adulthood, and it may not respond to treatment.  Pelvic pain.  Weight gain or obesity.  Patches of thickened and dark brown or black skin on the neck, arms, breasts, or thighs (acanthosis nigricans).  Excess hair growth on the face, chest, abdomen, or upper thighs (hirsutism).  How is this diagnosed? This condition is diagnosed based on:  Your medical history.  A physical exam, including a pelvic exam. Your health care provider may look for areas of increased hair growth on your skin.  Tests, such as: ? Ultrasound. This may be used to examine the ovaries and the lining of the uterus (endometrium) for cysts. ? Blood tests. These may be used to check levels of sugar (glucose), female hormone (testosterone), and female hormones (estrogen and progesterone) in your blood.  How is this treated? There is no cure for PCOS, but treatment can help to manage symptoms and prevent more health problems from developing. Treatment varies depending on:  Your symptoms.  Whether you want to have a baby or whether you need birth control (contraception).  Treatment may include nutrition and lifestyle changes along with:  Progesterone hormone to start a menstrual period.  Birth control pills to help you have regular menstrual periods.  Medicines to make you ovulate, if you want to get pregnant.  Medicine to reduce excessive hair growth.  Surgery, in severe cases. This may involve making small holes in one or both of your ovaries. This decreases the amount of testosterone that your body produces.  Follow these instructions at home:  Take over-the-counter and prescription medicines only  as told by your health care provider.  Follow a healthy meal plan. This can help you reduce the effects of PCOS. ? Eat a healthy diet that includes lean proteins, complex carbohydrates, fresh fruits and vegetables, low-fat dairy products, and healthy fats. Make sure to eat enough fiber.  If you are overweight, lose weight as told by your health care provider. ? Losing 10% of your body weight may improve symptoms. ? Your health care provider can determine how much weight loss is best for you and can help you lose weight safely.  Keep all follow-up visits as told by your health care provider. This is important. Contact a health care provider if:  Your symptoms do not get better with medicine.  You develop new symptoms. This information is not intended to replace advice given to you by your health care provider. Make sure you discuss any questions you have with your health care provider. Document Released: 05/13/2004 Document Revised: 09/15/2015 Document Reviewed: 07/05/2015 Elsevier Interactive Patient Education  2018 Elsevier Inc.  

## 2016-07-21 NOTE — Progress Notes (Signed)
Pt put on ocp in January did not have periods, had headaches dizziness nausea and vomiting. She stopped pills in April. Had a normal period. Now is having brown discharge.  Pt reports that she does think of harming herself but does not have a plan. She used to have a therapist but she quit and is currently looking for a new one. Pt given packet with resources for mental health. [facility] Pt given list of mental health providers as well.    As note above Please see prior office notes  Pt with H/O AUB. NL GYN U/S and TSH last year. Carlye Gripperied Apri from Jan to April. Did not tolerate. HA and bleeding was still irregular. Had normal cycle in May.  Dark spotting but a week late Has noted some facial and chest hair  About a 15-20 # wt gain   PE AF VSS Lungs clear Heart RRR Abd soft + BS  A/P AUB  ? [facility]. Will check additional labs today. Information on PCOS and PCOS diet provided to pt. Consider monthly Provera. Pt will be contacted with test results.Pt also scored high on PHQ9 and GAD7 test. Denies Homo/suicidal thoughts. Pt provided information for counselor options. Also will have Jamie contact pt for appt.

## 2016-07-24 LAB — COMPREHENSIVE METABOLIC PANEL
ALK PHOS: 104 IU/L (ref 39–117)
ALT: 17 IU/L (ref 0–32)
AST: 19 IU/L (ref 0–40)
Albumin/Globulin Ratio: 1.2 (ref 1.2–2.2)
Albumin: 4.1 g/dL (ref 3.5–5.5)
BUN/Creatinine Ratio: 8 — ABNORMAL LOW (ref 9–23)
BUN: 5 mg/dL — AB (ref 6–20)
Bilirubin Total: 0.4 mg/dL (ref 0.0–1.2)
CO2: 20 mmol/L (ref 20–29)
Calcium: 9.3 mg/dL (ref 8.7–10.2)
Chloride: 101 mmol/L (ref 96–106)
Creatinine, Ser: 0.64 mg/dL (ref 0.57–1.00)
GFR calc Af Amer: 145 mL/min/{1.73_m2} (ref >59)
GFR calc non Af Amer: 126 mL/min/{1.73_m2} (ref >59)
GLUCOSE: 91 mg/dL (ref 65–99)
Globulin, Total: 3.3 g/dL (ref 1.5–4.5)
Potassium: 4 mmol/L (ref 3.5–5.2)
Sodium: 137 mmol/L (ref 134–144)
Total Protein: 7.4 g/dL (ref 6.0–8.5)

## 2016-07-24 LAB — PROLACTIN: Prolactin: 16.5 ng/mL (ref 4.8–23.3)

## 2016-07-24 LAB — FOLLICLE STIMULATING HORMONE: FSH: 7.4 m[IU]/mL

## 2016-07-24 LAB — CBC
HEMATOCRIT: 41.2 % (ref 34.0–46.6)
Hemoglobin: 14 g/dL (ref 11.1–15.9)
MCH: 30.4 pg (ref 26.6–33.0)
MCHC: 34 g/dL (ref 31.5–35.7)
MCV: 89 fL (ref 79–97)
Platelets: 473 10*3/uL — ABNORMAL HIGH (ref 150–379)
RBC: 4.61 x10E6/uL (ref 3.77–5.28)
RDW: 14.5 % (ref 12.3–15.4)
WBC: 6.1 10*3/uL (ref 3.4–10.8)

## 2016-07-24 LAB — TESTT+TESTF+SHBG
SEX HORMONE BINDING: 22.1 nmol/L — AB (ref 24.6–122.0)
TESTOSTERONE FREE: 2.3 pg/mL (ref 0.0–4.2)
Testosterone, total: 21.5 ng/dL (ref 10.0–55.0)

## 2016-07-25 ENCOUNTER — Encounter: Payer: Self-pay | Admitting: Obstetrics and Gynecology

## 2016-07-26 ENCOUNTER — Other Ambulatory Visit: Payer: Self-pay

## 2016-07-26 MED ORDER — MEDROXYPROGESTERONE ACETATE 10 MG PO TABS: 10.0000 mg | ORAL_TABLET | Freq: Every day | ORAL | 2 refills | Status: DC

## 2016-07-26 NOTE — Telephone Encounter (Signed)
Patient informed of lab results. Per Dr.Ervin patient should start on provera 10 mg the first 10 days of cycle and follow up in next 4-6 weeks.

## 2016-08-02 ENCOUNTER — Telehealth: Payer: Self-pay | Admitting: Clinical

## 2016-08-02 NOTE — Telephone Encounter (Signed)
Attempt to schedule appointment for Integrated Behavioral Health; left HIPPA-compliant message to call back [facility] at [phone].

## 2016-08-22 ENCOUNTER — Encounter: Payer: Self-pay | Admitting: Obstetrics and Gynecology

## 2016-09-04 ENCOUNTER — Encounter: Payer: Self-pay | Admitting: Obstetrics and Gynecology

## 2016-09-15 ENCOUNTER — Ambulatory Visit (HOSPITAL_COMMUNITY)
Admission: EM | Admit: 2016-09-15 | Discharge: 2016-09-15 | Disposition: A | Discharge status: Home / Self Care | Payer: Medicaid Other | Attending: Family Medicine | Admitting: Family Medicine

## 2016-09-15 ENCOUNTER — Encounter: Payer: Self-pay | Admitting: Obstetrics and Gynecology

## 2016-09-15 ENCOUNTER — Encounter (HOSPITAL_COMMUNITY): Payer: Self-pay | Admitting: Family Medicine

## 2016-09-15 DIAGNOSIS — L298 Other pruritus: Secondary | ICD-10-CM | POA: Insufficient documentation

## 2016-09-15 DIAGNOSIS — Z3202 Encounter for pregnancy test, result negative: Secondary | ICD-10-CM | POA: Diagnosis not present

## 2016-09-15 DIAGNOSIS — R102 Pelvic and perineal pain: Secondary | ICD-10-CM

## 2016-09-15 DIAGNOSIS — N760 Acute vaginitis: Secondary | ICD-10-CM | POA: Insufficient documentation

## 2016-09-15 DIAGNOSIS — N941 Unspecified dyspareunia: Secondary | ICD-10-CM | POA: Diagnosis not present

## 2016-09-15 LAB — POCT URINALYSIS DIP (DEVICE)
BILIRUBIN URINE: NEGATIVE
Glucose, UA: NEGATIVE mg/dL
KETONES UR: NEGATIVE mg/dL
LEUKOCYTES UA: NEGATIVE
NITRITE: NEGATIVE
Protein, ur: NEGATIVE mg/dL
Specific Gravity, Urine: 1.02 (ref 1.005–1.030)
Urobilinogen, UA: 1 mg/dL (ref 0.0–1.0)
pH: 7 (ref 5.0–8.0)

## 2016-09-15 MED ORDER — FLUCONAZOLE 200 MG PO TABS: ORAL_TABLET | ORAL | 0 refills | Status: DC

## 2016-09-15 MED ORDER — METRONIDAZOLE 500 MG PO TABS: 500.0000 mg | ORAL_TABLET | Freq: Two times a day (BID) | ORAL | 0 refills | Status: DC

## 2016-09-15 NOTE — ED Provider Notes (Signed)
[facility]   [id number] 09/15/16 Arrival Time: 1553   SUBJECTIVE:  Julia Potter is a 26 y.o. female who presents to the urgent care  with complaint of vaginal itching, and foul odor. She is sexually active, with 1 partner, does not use condoms, states she gets frequent infections of BV, but states that she feels this is what it is, however she would also like STD testing. She has had some pelvic pain, and bleeding with intercourse, denies flank pain, nausea, vomiting, or diarrhea, last menstrual period was 07/19/2016, otherwise history is negative  ROS: As per HPI, remainder of ROS negative.   OBJECTIVE:  Vitals:   09/15/16 1622  BP: 112/77  Pulse: (!) 116  Resp: 18  Temp: 99.5 F (37.5 C)  TempSrc: Oral  SpO2: 100%     General appearance: alert; no distress HEENT: normocephalic; atraumatic; conjunctivae normal;  Lungs: clear to auscultation bilaterally Heart: regular rate and rhythm Abdomen: soft, non-tender; bowel sounds normal; no masses or organomegaly; no guarding or rebound tenderness GU: No vaginal discharge, or abrasions or other sources of trauma seen to the external genitalia, no cervical motion tenderness, no discharge from the cervical os, small, flesh-colored's cyst at the 2:00 position from the cervix, nontender, no adnexal tenderness, uterus normal, no cysts or lesions noted to the external genitalia Musculoskeletal: No CVA tenderness  Skin: warm and dry Neurologic: Grossly normal Psychological:  alert and cooperative; normal mood and affect     ASSESSMENT & PLAN:  1. Acute vaginitis     Meds ordered this encounter  Medications  . fluconazole (DIFLUCAN) 200 MG tablet    Sig: Take one tablet today, wait 3 days, take the second tablet    Dispense:  2 tablet    Refill:  0    Order Specific Question:   Supervising Provider    AnswerMardella Layman:   HAGLER, BRIAN [[id number]]  . metroNIDAZOLE (FLAGYL) 500 MG tablet    Sig: Take 1 tablet (500 mg total)  by mouth 2 (two) times daily.    Dispense:  14 tablet    Refill:  0    Order Specific Question:   Supervising Provider    Answer:   Mardella LaymanHAGLER, BRIAN [[id number]]   Cyst seen on the cervix has been seen, and evaluated by gynecologists per the patient, and she said she was told that this was a normal finding. Refer back to gynecology for monitoring. Pregnancy test negative, obtaining cytology to test for gonorrhea, chlamydia, Trichomonas, BV, yeast, treating presumptively with Diflucan and metronidazole. Recommend safe sex practices follow-up with gynecologist as needed Reviewed expectations re: course of current medical issues. Questions answered. Outlined signs and symptoms indicating need for more acute intervention. Patient verbalized understanding. After Visit Summary given.    Procedures:     Results for orders placed or performed during the hospital encounter of 09/15/16  POCT urinalysis dip (device)  Result Value Ref Range   Glucose, UA NEGATIVE NEGATIVE mg/dL   Bilirubin Urine NEGATIVE NEGATIVE   Ketones, ur NEGATIVE NEGATIVE mg/dL   Specific Gravity, Urine 1.020 1.005 - 1.030   Hgb urine dipstick TRACE (A) NEGATIVE   pH 7.0 5.0 - 8.0   Protein, ur NEGATIVE NEGATIVE mg/dL   Urobilinogen, UA 1.0 0.0 - 1.0 mg/dL   Nitrite NEGATIVE NEGATIVE   Leukocytes, UA NEGATIVE NEGATIVE    Labs Reviewed  POCT URINALYSIS DIP (DEVICE) - Abnormal; Notable for the following:       Result Value   Hgb urine  dipstick TRACE (*)    All other components within normal limits  CERVICOVAGINAL ANCILLARY ONLY    No results found.  No Known Allergies  PMHx, SurgHx, SocialHx, Medications, and Allergies were reviewed in the Visit Navigator and updated as appropriate.       Dorena Bodo, NP 09/15/16 1719

## 2016-09-15 NOTE — ED Triage Notes (Signed)
Pt here for vaginal discharge, odor and bleeding more after sexual intercourse. sts hx of BV and wants to be checked for STDS.

## 2016-09-15 NOTE — Discharge Instructions (Signed)
To treat the bacteria that commonly calls bacterial vaginosis or vaginitis, I have prescribed metronidazole. Take 1 tablet twice a day for 7 days. Do not drink any alcohol while taking this medicine as it can make you very ill. Also to cover yeast I prescribed diflucan, take one pill, then wait three days, then take the third pill. Also, your urine is being tested for gonorrhea, chlamydia, trichomonas BC, and yeast, if there are any positive findings, you will be contacted in 3-5 business days.  I also recommend safe sex practices such as abstinence or the use of barrier methods such as condoms while you are being treated. If your symptoms persist, follow up with your primary care provider, gynecologist, the health department, or return to clinic.

## 2016-09-16 LAB — CERVICOVAGINAL ANCILLARY ONLY
BACTERIAL VAGINITIS: NEGATIVE
CANDIDA VAGINITIS: NEGATIVE
CHLAMYDIA, DNA PROBE: NEGATIVE
Neisseria Gonorrhea: NEGATIVE
TRICH (WINDOWPATH): NEGATIVE

## 2016-09-16 LAB — POCT PREGNANCY, URINE: Preg Test, Ur: NEGATIVE

## 2016-10-12 ENCOUNTER — Other Ambulatory Visit (HOSPITAL_COMMUNITY)
Admission: RE | Admit: 2016-10-12 | Discharge: 2016-10-12 | Disposition: A | Discharge status: Home / Self Care | Payer: Medicaid Other | Source: Ambulatory Visit | Attending: Obstetrics & Gynecology | Admitting: Obstetrics & Gynecology

## 2016-10-12 ENCOUNTER — Ambulatory Visit (INDEPENDENT_AMBULATORY_CARE_PROVIDER_SITE_OTHER): Payer: Medicaid Other | Admitting: Obstetrics and Gynecology

## 2016-10-12 ENCOUNTER — Encounter: Payer: Self-pay | Admitting: Obstetrics and Gynecology

## 2016-10-12 VITALS — BP 138/79 | HR 102 | Wt 225.0 lb

## 2016-10-12 DIAGNOSIS — Z01419 Encounter for gynecological examination (general) (routine) without abnormal findings: Secondary | ICD-10-CM | POA: Diagnosis not present

## 2016-10-12 DIAGNOSIS — Z0000 Encounter for general adult medical examination without abnormal findings: Secondary | ICD-10-CM

## 2016-10-12 DIAGNOSIS — Z01411 Encounter for gynecological examination (general) (routine) with abnormal findings: Secondary | ICD-10-CM | POA: Diagnosis not present

## 2016-10-12 DIAGNOSIS — Z319 Encounter for procreative management, unspecified: Secondary | ICD-10-CM | POA: Insufficient documentation

## 2016-10-12 DIAGNOSIS — N939 Abnormal uterine and vaginal bleeding, unspecified: Secondary | ICD-10-CM

## 2016-10-12 NOTE — Progress Notes (Signed)
   GYNECOLOGY OFFICE VISIT NOTE  History:  26 y.o. [id number] here today for annual exam and pap smear. She denies any abnormal vaginal discharge, bleeding, pelvic pain or fevers.   Acute concerns: #AUB - patient being worked up for AUB by previous provider. She has not had a menstraul since June. Has h/o irregular menstraul cycles. She is trying to get pregnant but does not believe she is able. She had a miscarriage a year ago. She is sexually active.  She was given Provera to help her. She took all doses but did not have a period. Never followed up.   Past Medical History:  Diagnosis Date  . ALLERGIC RHINITIS   . Anxiety   . Chlamydia   . Depression    doing ok, in therapy  . Herpes   . Pregnancy induced hypertension     Past Surgical History:  Procedure Laterality Date  . INTRAUTERINE DEVICE INSERTION  09/2010  . NO PAST SURGERIES      The following portions of the patient's history were reviewed and updated as appropriate: allergies, current medications, past family history, past medical history, past social history, past surgical history and problem list.   Health Maintenance:  Pap smear due.   Review of Systems:  Pertinent items noted in HPI and remainder of comprehensive ROS otherwise negative.   Objective:  Physical Exam BP 138/79   Pulse (!) 102   Wt 102.1 kg (225 lb)   LMP 07/19/2016 (Exact Date)   BMI 41.15 kg/m  CONSTITUTIONAL: Well-developed, well-nourished female in no acute distress.  HENT:  Normocephalic, atraumatic. External right and left ear normal. Oropharynx is clear and moist EYES: Conjunctivae and EOM are normal. Pupils are equal, round, and reactive to light. No scleral icterus.  NECK: Normal range of motion, supple, no masses SKIN: Skin is warm and dry. No rash noted. Not diaphoretic. No erythema. No pallor. NEUROLOGIC: Alert and oriented to person, place, and time. Normal reflexes, muscle tone coordination. No cranial nerve deficit  noted. PSYCHIATRIC: Normal mood and affect. Normal behavior. Normal judgment and thought content. CARDIOVASCULAR: Normal heart rate noted RESPIRATORY: Effort and breath sounds normal, no problems with respiration noted ABDOMEN: Soft, non-tender, no distention noted.   Breasts: bilateral breasts normal in appearance. No erythema, deformity, or nipple discharge. No palpable abnormal masses. No axillary lymphadenopathy. PELVIC: Normal appearing external genitalia; normal appearing vaginal mucosa and cervix.  No abnormal discharge noted.  Normal uterine size, no other palpable masses, no uterine or adnexal tenderness. MUSCULOSKELETAL: Normal range of motion. No edema noted.  Labs and Imaging No results found.  Assessment & Plan:  1. Gynecologic exam normal Normal well woman exam. Pap smear collected. - Cytology - PAP  2. Abnormal uterine bleeding (AUB) Patient to follow-up with Dr. Alysia PennaErvin. PCOS labs normal.    3. Patient desires pregnancy - Ambulatory referral to Infertility  Routine preventative health maintenance measures emphasized. Please refer to After Visit Summary for other counseling recommendations.   Return in about 2 weeks (around 10/26/2016) for AUB.   Caryl AdaJazma Phelps, DO OB Fellow 10/12/2016, 1:42 PM

## 2016-10-12 NOTE — Progress Notes (Signed)
Left message on voice mail of Maggie, [facility] with patient name and DOB, asked her to call me with any questions.

## 2016-10-12 NOTE — Patient Instructions (Signed)
Things to do to Keep yourself Healthy - Exercise at least 30-45 minutes a day,  3-4 days a week.  - Eat a low-fat diet with lots of fruits and vegetables, up to 7-9 servings per day. - Seatbelts can save your life. Wear them always. - Smoke detectors on every level of your home, check batteries every year. - Eye Doctor - have an eye exam every 1-2 years - Safe sex - if you may be exposed to STDs, use a condom. - Alcohol If you drink, do it moderately,less than 2 drinks per day. - Health Care Power of Attorney.  Choose someone to speak for you if you are not able. - Depression is common in our stressful world.If you're feeling down or losing interest in things you normally enjoy, please come in for a visit. - Violence - If anyone is threatening or hurting you, please call immediately.   Abnormal Uterine Bleeding Abnormal uterine bleeding can affect women at various stages in life, including teenagers, women in their reproductive years, pregnant women, and women who have reached menopause. Several kinds of uterine bleeding are considered abnormal, including:  Bleeding or spotting between periods.  Bleeding after sexual intercourse.  Bleeding that is heavier or more than normal.  Periods that last longer than usual.  Bleeding after menopause.  Many cases of abnormal uterine bleeding are minor and simple to treat, while others are more serious. Any type of abnormal bleeding should be evaluated by your health care provider. Treatment will depend on the cause of the bleeding. Follow these instructions at home: Monitor your condition for any changes. The following actions may help to alleviate any discomfort you are experiencing:  Avoid the use of tampons and douches as directed by your health care provider.  Change your pads frequently.  You should get regular pelvic exams and Pap tests. Keep all follow-up appointments for diagnostic tests as directed by your health care  provider. Contact a health care provider if:  Your bleeding lasts more than 1 week.  You feel dizzy at times. Get help right away if:  You pass out.  You are changing pads every 15 to 30 minutes.  You have abdominal pain.  You have a fever.  You become sweaty or weak.  You are passing large blood clots from the vagina.  You start to feel nauseous and vomit. This information is not intended to replace advice given to you by your health care provider. Make sure you discuss any questions you have with your health care provider. Document Released: 01/17/2005 Document Revised: 07/01/2015 Document Reviewed: 08/16/2012 Elsevier Interactive Patient Education  2017 [employer]

## 2016-10-14 LAB — CYTOLOGY - PAP
Adequacy: ABSENT
Diagnosis: NEGATIVE

## 2016-10-26 ENCOUNTER — Ambulatory Visit: Payer: Medicaid Other | Admitting: Obstetrics & Gynecology

## 2016-10-28 ENCOUNTER — Telehealth: Payer: Self-pay | Admitting: *Deleted

## 2016-10-28 ENCOUNTER — Ambulatory Visit (INDEPENDENT_AMBULATORY_CARE_PROVIDER_SITE_OTHER): Payer: Medicaid Other | Admitting: Obstetrics and Gynecology

## 2016-10-28 ENCOUNTER — Encounter: Payer: Self-pay | Admitting: Obstetrics and Gynecology

## 2016-10-28 VITALS — BP 128/75 | HR 85 | Wt 225.6 lb

## 2016-10-28 DIAGNOSIS — N939 Abnormal uterine and vaginal bleeding, unspecified: Secondary | ICD-10-CM

## 2016-10-28 MED ORDER — METFORMIN HCL 500 MG PO TABS: ORAL_TABLET | ORAL | 5 refills | Status: DC

## 2016-10-28 NOTE — Telephone Encounter (Signed)
U/s scheduled for 10/5 . Called patient, got voice mail. Left message stating I am calling with appt details please return my call at the clinic. Will send MyChart message as well.

## 2016-10-28 NOTE — Patient Instructions (Signed)
[facility] [address] [place], [place] [address]   [phone]  We are pleased to offer you the following services as part of evaluation and management of infertility.  Please note that we are specialized in general obstetrics and gynecology, and we are not infertility specialists who had to undergo additional years of training.  If you desire, we can give you an outright referral to an infertility specialist.  Prior to initiating any therapy for infertility, we require that you undergo a hysterosalpingogram (HSG) to evaluate your reproductive system and your female partner has to undergo a semen analysis.  These results need to be received by our clinic before we recommend any treatments.  The hysterosalpingogram and semen analysis can be performed at the following places; prices are approximate and are subject to change:  1) [facility] Semen Analysis: Not offered Hysterosalpingogram: Facility Fee $600  Reading Fee $230 This is the discounted self-pay price; needs to be paid up front.  2) [facility]   [phone]   [address] [facility] [address], [place] [address] Semen Analysis: [phone] Hysterosalpingogram: Performed at [facility] Facility Fee [phone]  Reading Fee $305     Self pay patients get 20% of the reading fee and 40% discount off the facility fee if bill is is paid up front  3) [facility] [phone]  [address] [place], [place] [address] Semen Analysis:  $120 Hysterosalpingogram: Performed in their facility  Facility Fee $1200  Reading Fee [phone]  Self pay patients get 40% discount off the facility fee if bill is is paid up front  After these results are obtained; we will evaluate them and recommend appropriate subsequent therapy which may involving medications and/or surgery. There is a $200 fee for initial consultation; and $100 fee per visit for any subsequent  visits.  Thank you for choosing [place] and our services.  Please call [phone] for any further questions or concerns regarding infertility management or other gynecologic issues.

## 2016-10-28 NOTE — Progress Notes (Signed)
Patient here for f/u AUB. She had a period on 9/14 which lasted 4 days. Previous period in June. Was referred to infertility, could not see due to insurance issues. PHQ-9 and Gad-7 are both 15, no SI. IBH not available today.

## 2016-10-28 NOTE — Progress Notes (Signed)
Patient ID: Synetta Fail, female   DOB: Aug 11, 1990, 27 y.o.   MRN: [id number] Ms. Zappia presents for follow up of her AUB. Please see previous notes.  Pt has had irregular cycles since she had a SAB in Jan 2017. She has had a normal U/S a year ago. Lab work up has essentially been unremarkable as well.  She was unable to tolerate OCP's. She tried Provera July and August without any bleeding.  She had a cycle in April, June and Sept.  She desires pregnancy  H/O PTD at 34 weeks d/t [facility]  Partner 27 healthy, was FOB of SAB in Jan 2017.   PE AF VSS Lungs clear Heart RRR Abd soft + BS   A/P AUB  No clear lab evidence of PCOS but with AUB and hirsutism, I suspect she has some degree of PCOS. She has previously been provided PCOS diet information. Will continue to cycle with Provera monthly, add Glucophage, repeat U/S and information for partner to obtain semen analysis provided to pt. Will f/u pending these results.

## 2016-11-04 ENCOUNTER — Ambulatory Visit (HOSPITAL_COMMUNITY)
Admission: RE | Admit: 2016-11-04 | Discharge: 2016-11-04 | Disposition: A | Discharge status: Home / Self Care | Payer: Medicaid Other | Source: Ambulatory Visit | Attending: Obstetrics and Gynecology | Admitting: Obstetrics and Gynecology

## 2016-11-04 DIAGNOSIS — N939 Abnormal uterine and vaginal bleeding, unspecified: Secondary | ICD-10-CM | POA: Diagnosis present

## 2017-02-13 ENCOUNTER — Encounter: Payer: Self-pay | Admitting: Obstetrics and Gynecology

## 2017-03-22 ENCOUNTER — Other Ambulatory Visit (HOSPITAL_COMMUNITY)
Admission: RE | Admit: 2017-03-22 | Discharge: 2017-03-22 | Disposition: A | Discharge status: Home / Self Care | Payer: Medicaid Other | Source: Ambulatory Visit | Attending: Obstetrics and Gynecology | Admitting: Obstetrics and Gynecology

## 2017-03-22 ENCOUNTER — Encounter: Payer: Self-pay | Admitting: Obstetrics and Gynecology

## 2017-03-22 ENCOUNTER — Ambulatory Visit: Payer: Self-pay | Admitting: Obstetrics and Gynecology

## 2017-03-22 VITALS — BP 131/84 | HR 96 | Wt 227.0 lb

## 2017-03-22 DIAGNOSIS — N939 Abnormal uterine and vaginal bleeding, unspecified: Secondary | ICD-10-CM

## 2017-03-22 DIAGNOSIS — N898 Other specified noninflammatory disorders of vagina: Secondary | ICD-10-CM | POA: Insufficient documentation

## 2017-03-22 DIAGNOSIS — Z319 Encounter for procreative management, unspecified: Secondary | ICD-10-CM

## 2017-03-22 DIAGNOSIS — L680 Hirsutism: Secondary | ICD-10-CM

## 2017-03-22 DIAGNOSIS — Z01419 Encounter for gynecological examination (general) (routine) without abnormal findings: Secondary | ICD-10-CM | POA: Insufficient documentation

## 2017-03-22 DIAGNOSIS — Z01411 Encounter for gynecological examination (general) (routine) with abnormal findings: Secondary | ICD-10-CM | POA: Diagnosis not present

## 2017-03-22 MED ORDER — METFORMIN HCL ER 500 MG PO TB24: ORAL_TABLET | ORAL | 1 refills | Status: DC

## 2017-03-22 NOTE — Progress Notes (Signed)
Pt stated last LMP Nov, facial and chest hair growing, headaches for about 2 years.

## 2017-03-22 NOTE — Patient Instructions (Signed)
Boric acid Vaginal boric acid 600 mg nightly for fourweeks, then twice weekly for 6 months for suppression of BV.    Will call you about results of sugar test and swabs

## 2017-03-23 ENCOUNTER — Encounter: Payer: Self-pay | Admitting: Obstetrics and Gynecology

## 2017-03-23 LAB — CERVICOVAGINAL ANCILLARY ONLY
BACTERIAL VAGINITIS: NEGATIVE
Candida vaginitis: NEGATIVE
Chlamydia: NEGATIVE
Neisseria Gonorrhea: NEGATIVE
Trichomonas: NEGATIVE

## 2017-03-23 LAB — HEMOGLOBIN A1C
ESTIMATED AVERAGE GLUCOSE: 100 mg/dL
HEMOGLOBIN A1C: 5.1 % (ref 4.8–5.6)

## 2017-03-23 LAB — HIV ANTIBODY (ROUTINE TESTING W REFLEX): HIV Screen 4th Generation wRfx: NONREACTIVE

## 2017-03-23 NOTE — Progress Notes (Signed)
   GYNECOLOGY OFFICE VISIT NOTE  History:   Chief Complaint  Patient presents with  . Hirsutism  . Vaginal Discharge   27 y.o. [id number] here today for complaints about increasing hirsutism and continued AUB. Patient has had extensive work-up but still feels like no one is able to give her a diagnosis. States she started noticing the facial hair and weight gain back in Dec 2016. LMP was this month but was abnormal for her because it was only brown blood. Before this bleeding episode she last bleed in October 2018. She is no longer taking medications that were last prescribed to her. She has not followed up with the infertility specialist.   Also endorsing some vaginal discharge for about 2 weeks, Has a history of BV and yeast and believes this is BV again.    Past Medical History:  Diagnosis Date  . ALLERGIC RHINITIS   . Anxiety   . Chlamydia   . Depression    doing ok, in therapy  . Herpes   . Pregnancy induced hypertension     Past Surgical History:  Procedure Laterality Date  . INTRAUTERINE DEVICE INSERTION  09/2010  . NO PAST SURGERIES      Review of Systems:  Pertinent items noted in HPI.    Objective:  Physical Exam BP 131/84   Pulse 96   Wt 103 kg (227 lb)   BMI 41.52 kg/m  CONSTITUTIONAL: Well-developed, well-nourished female in no acute distress.  HENT:  Normocephalic, atraumatic. External right and left ear normal. Oropharynx is clear and moist. Hair growth noted on chin and sides of face.  SKIN: Skin is warm and dry. No rash noted. Not diaphoretic. No erythema. No pallor.  NEUROLOGIC: Alert and oriented to person, place, and time.  PSYCHIATRIC: Normal mood and affect. CARDIOVASCULAR: Normal heart rate noted. Chest with minimal hair growth.  RESPIRATORY: Effort and breath sounds normal, no problems with respiration noted ABDOMEN: Soft, non-tender, no distention noted.  No masses palpated Genitalia:  Normal introitus for age, no external lesions, + vaginal  discharge, normal uterus size and position, no adnexal masses or tenderness  Labs and Imaging No results found.  Assessment & Plan:  1. Hirsutism Has had AUB/PCOS work-up that was largely unremarkable. May have some mild variant of diease state. Other metabolic labs also reviewed and wnl. Will collect A1c as no one has tested patient recently for diabetes. Patient also given Metformin XL. - Hemoglobin A1c  2. Abnormal uterine bleeding (AUB) Has had work-up before. Patient has been on birth control prior which helped with her DUB. She does not want birthcontrol at this time as she is trying to get pregnant.  3. Vaginal discharge Wet prep collected. Patient also requesting HIV testing. Will await results. Patient given information on boric acid for recurrent BV.  - Cervicovaginal ancillary only - HIV antibody (with reflex)  4. Patient desires pregnancy Referral placed last year for infertility. Patient given number to clinic to call to schedule appointment as she did not follow through with appointment last time.   Please refer to After Visit Summary for other counseling recommendations.   Caryl AdaJazma Triva Hueber, DO OB Fellow 03/23/2017, 1:49 PM

## 2017-03-24 ENCOUNTER — Other Ambulatory Visit: Payer: Self-pay | Admitting: Obstetrics and Gynecology

## 2017-04-03 ENCOUNTER — Other Ambulatory Visit: Payer: Self-pay | Admitting: Obstetrics and Gynecology

## 2017-04-10 ENCOUNTER — Inpatient Hospital Stay (HOSPITAL_COMMUNITY)
Admission: AD | Admit: 2017-04-10 | Discharge: 2017-04-11 | Disposition: A | Discharge status: Home / Self Care | Payer: Medicaid Other | Source: Ambulatory Visit | Attending: Obstetrics and Gynecology | Admitting: Obstetrics and Gynecology

## 2017-04-10 DIAGNOSIS — R110 Nausea: Secondary | ICD-10-CM | POA: Diagnosis not present

## 2017-04-10 DIAGNOSIS — O26899 Other specified pregnancy related conditions, unspecified trimester: Secondary | ICD-10-CM | POA: Insufficient documentation

## 2017-04-10 DIAGNOSIS — R197 Diarrhea, unspecified: Secondary | ICD-10-CM | POA: Diagnosis not present

## 2017-04-10 DIAGNOSIS — O26891 Other specified pregnancy related conditions, first trimester: Secondary | ICD-10-CM | POA: Diagnosis not present

## 2017-04-10 DIAGNOSIS — F419 Anxiety disorder, unspecified: Secondary | ICD-10-CM | POA: Diagnosis not present

## 2017-04-10 DIAGNOSIS — O99341 Other mental disorders complicating pregnancy, first trimester: Secondary | ICD-10-CM | POA: Insufficient documentation

## 2017-04-10 DIAGNOSIS — Z79899 Other long term (current) drug therapy: Secondary | ICD-10-CM | POA: Insufficient documentation

## 2017-04-10 DIAGNOSIS — Z3A01 Less than 8 weeks gestation of pregnancy: Secondary | ICD-10-CM | POA: Diagnosis not present

## 2017-04-10 DIAGNOSIS — F329 Major depressive disorder, single episode, unspecified: Secondary | ICD-10-CM | POA: Diagnosis not present

## 2017-04-10 DIAGNOSIS — R109 Unspecified abdominal pain: Secondary | ICD-10-CM | POA: Diagnosis not present

## 2017-04-10 DIAGNOSIS — O3680X0 Pregnancy with inconclusive fetal viability, not applicable or unspecified: Secondary | ICD-10-CM

## 2017-04-10 DIAGNOSIS — Z7984 Long term (current) use of oral hypoglycemic drugs: Secondary | ICD-10-CM | POA: Insufficient documentation

## 2017-04-10 DIAGNOSIS — O9989 Other specified diseases and conditions complicating pregnancy, childbirth and the puerperium: Secondary | ICD-10-CM | POA: Diagnosis not present

## 2017-04-10 NOTE — MAU Note (Signed)
Pt states she had 3 +upt today. Pt reports lower abdominal cramping that started Thursday. Pt states she's been taking ibuprofen for pain. States she also took Pepto-Bismol. States it helped some. Pt states she feels constipated-has had a BM, but states it feels as if she is not completely empty. Pt states she had 1 episode of vomiting-feels like she has heartburn. Pt denies vaginal bleeding or discharge.

## 2017-04-11 ENCOUNTER — Other Ambulatory Visit: Payer: Self-pay

## 2017-04-11 ENCOUNTER — Inpatient Hospital Stay (HOSPITAL_COMMUNITY): Payer: Medicaid Other

## 2017-04-11 ENCOUNTER — Encounter (HOSPITAL_COMMUNITY): Payer: Self-pay

## 2017-04-11 DIAGNOSIS — Z3A01 Less than 8 weeks gestation of pregnancy: Secondary | ICD-10-CM | POA: Diagnosis not present

## 2017-04-11 DIAGNOSIS — O26891 Other specified pregnancy related conditions, first trimester: Secondary | ICD-10-CM | POA: Diagnosis not present

## 2017-04-11 DIAGNOSIS — R109 Unspecified abdominal pain: Secondary | ICD-10-CM | POA: Diagnosis not present

## 2017-04-11 LAB — URINALYSIS, ROUTINE W REFLEX MICROSCOPIC
BILIRUBIN URINE: NEGATIVE
GLUCOSE, UA: NEGATIVE mg/dL
HGB URINE DIPSTICK: NEGATIVE
Ketones, ur: NEGATIVE mg/dL
Leukocytes, UA: NEGATIVE
Nitrite: NEGATIVE
PH: 6 (ref 5.0–8.0)
Protein, ur: NEGATIVE mg/dL
SPECIFIC GRAVITY, URINE: 1.013 (ref 1.005–1.030)

## 2017-04-11 LAB — WET PREP, GENITAL
Clue Cells Wet Prep HPF POC: NONE SEEN
SPERM: NONE SEEN
Trich, Wet Prep: NONE SEEN
Yeast Wet Prep HPF POC: NONE SEEN

## 2017-04-11 LAB — CBC
HCT: 40.3 % (ref 36.0–46.0)
Hemoglobin: 13.8 g/dL (ref 12.0–15.0)
MCH: 30.3 pg (ref 26.0–34.0)
MCHC: 34.2 g/dL (ref 30.0–36.0)
MCV: 88.6 fL (ref 78.0–100.0)
PLATELETS: 393 10*3/uL (ref 150–400)
RBC: 4.55 MIL/uL (ref 3.87–5.11)
RDW: 13 % (ref 11.5–15.5)
WBC: 9.7 10*3/uL (ref 4.0–10.5)

## 2017-04-11 LAB — HCG, QUANTITATIVE, PREGNANCY: hCG, Beta Chain, Quant, S: 458 m[IU]/mL — ABNORMAL HIGH (ref <5)

## 2017-04-11 NOTE — MAU Note (Signed)
Feeling of discomfort and constipation in abdomen. Menstrual type cramping.

## 2017-04-11 NOTE — MAU Note (Signed)
UPT + here. Unable to document in glucometer.

## 2017-04-11 NOTE — MAU Provider Note (Signed)
History     CSN: [id number]  Arrival date and time: 04/10/17 2337   First Provider Initiated Contact with Patient 04/11/17 0026      Chief Complaint  Patient presents with  . Possible Pregnancy  . Abdominal Pain  . Nausea  . Diarrhea   [id number] @[redacted]w[redacted]d  by LMP here with low abd cramping. Sx started 4 days ago. Had 3 positive HPT earlier today. No fevers. No urinary sx. No VB.   Past Medical History:  Diagnosis Date  . ALLERGIC RHINITIS   . Anxiety   . Chlamydia   . Depression    doing ok, in therapy  . Herpes   . Pregnancy induced hypertension     Past Surgical History:  Procedure Laterality Date  . INTRAUTERINE DEVICE INSERTION  09/2010  . NO PAST SURGERIES      Family History  Problem Relation Age of Onset  . Diabetes Maternal Grandmother   . Asthma Daughter     Social History   Tobacco Use  . Smoking status: Never Smoker  . Smokeless tobacco: Never Used  Substance Use Topics  . Alcohol use: Yes    Comment: 2 per week  . Drug use: No    Allergies: No Known Allergies  Medications Prior to Admission  Medication Sig Dispense Refill Last Dose  . ibuprofen (ADVIL,MOTRIN) 200 MG tablet Take 400 mg by mouth every 6 (six) hours as needed for moderate pain.   04/11/2017 at Unknown time  . metFORMIN (GLUCOPHAGE-XR) 500 MG 24 hr tablet Take one tablet by mouth daily for one week. Then increase to one tablet twice a day for one week. Then two tablets twice a day 30 tablet 1 Past Month at Unknown time  . diphenhydrAMINE (BENADRYL) 25 MG tablet Take 25 mg by mouth at bedtime as needed for allergies.   Unknown at Unknown time  . JUNEL FE 24 1-20 MG-MCG(24) tablet TAKE ONE TABLET BY MOUTH DAILY 84 tablet 10     Review of Systems  Constitutional: Negative for fever.  Gastrointestinal: Positive for abdominal pain.  Genitourinary: Negative for dysuria, vaginal bleeding and vaginal discharge.   Physical Exam   Blood pressure 138/86, pulse (!) 106, temperature 98.3 F  (36.8 C), temperature source Oral, resp. rate 20, height 5\' 2"  (1.575 m), weight 229 lb (103.9 kg), last menstrual period 03/15/2017, SpO2 100 %.  Physical Exam  Nursing note and vitals reviewed. Constitutional: She is oriented to person, place, and time. She appears well-developed and well-nourished. No distress.  HENT:  Head: Normocephalic and atraumatic.  Neck: Normal range of motion.  Respiratory: Effort normal. No respiratory distress.  Genitourinary:  Genitourinary Comments: External: no lesions or erythema Vagina: rugated, pink, moist, thick white discharge Uterus: non enlarged, anteverted, non tender, no CMT Adnexae: no masses, no tenderness left, no tenderness right Cervix closed   Musculoskeletal: Normal range of motion.  Neurological: She is alert and oriented to person, place, and time.  Skin: Skin is warm and dry.  Psychiatric: She has a normal mood and affect.   Results for orders placed or performed during the hospital encounter of 04/10/17 (from the past 24 hour(s))  Urinalysis, Routine w reflex microscopic     Status: None   Collection Time: 04/10/17 11:47 PM  Result Value Ref Range   Color, Urine YELLOW YELLOW   APPearance CLEAR CLEAR   Specific Gravity, Urine 1.013 1.005 - 1.030   pH 6.0 5.0 - 8.0   Glucose, UA NEGATIVE NEGATIVE mg/dL  Hgb urine dipstick NEGATIVE NEGATIVE   Bilirubin Urine NEGATIVE NEGATIVE   Ketones, ur NEGATIVE NEGATIVE mg/dL   Protein, ur NEGATIVE NEGATIVE mg/dL   Nitrite NEGATIVE NEGATIVE   Leukocytes, UA NEGATIVE NEGATIVE  CBC     Status: None   Collection Time: 04/11/17 12:36 AM  Result Value Ref Range   WBC 9.7 4.0 - 10.5 K/uL   RBC 4.55 3.87 - 5.11 MIL/uL   Hemoglobin 13.8 12.0 - 15.0 g/dL   HCT 40.9 81.1 - 91.4 %   MCV 88.6 78.0 - 100.0 fL   MCH 30.3 26.0 - 34.0 pg   MCHC 34.2 30.0 - 36.0 g/dL   RDW 78.2 95.6 - 21.3 %   Platelets 393 150 - 400 K/uL  hCG, quantitative, pregnancy     Status: Abnormal   Collection Time:  04/11/17 12:36 AM  Result Value Ref Range   hCG, Beta Chain, Quant, S 458 (H) <5 mIU/mL  Wet prep, genital     Status: Abnormal   Collection Time: 04/11/17 12:52 AM  Result Value Ref Range   Yeast Wet Prep HPF POC NONE SEEN NONE SEEN   Trich, Wet Prep NONE SEEN NONE SEEN   Clue Cells Wet Prep HPF POC NONE SEEN NONE SEEN   WBC, Wet Prep HPF POC FEW (A) NONE SEEN   Sperm NONE SEEN    US Ob Less Than 14 Weeks With Ob Transvaginal  Result Date: 04/11/2017 CLINICAL DATA:  27 y/o F; 4 days of cramping. Positive urine pregnancy test. Beta hCG pending. EXAM: OBSTETRIC <14 WK [place] AND TRANSVAGINAL OB US TECHNIQUE: Both transabdominal and transvaginal ultrasound examinations were performed for complete evaluation of the gestation as well as the maternal uterus, adnexal regions, and pelvic cul-de-sac. Transvaginal technique was performed to assess early pregnancy. COMPARISON:  None. FINDINGS: Intrauterine gestational sac: None Subchorionic hemorrhage:  None visualized. Maternal uterus/adnexae: Normal right ovary measures 1.9 x 1.5 x 1.4 cm. Normal left ovary measures 1.8 x 1.6 x 1.6 cm. Trace fluid within the endometrial cavity. Moderate volume of simple fluid in the pelvis. No adnexal mass identified. IMPRESSION: 1. Positive urine pregnancy test, if beta hCG is positive, then pregnancy of unknown location. Intrauterine pregnancy may not be identified with a beta HCG of less than 3,000. Follow-up beta HCG and possible pelvic ultrasound as indicated is recommended to evaluate pregnancy location. This recommendation follows SRU consensus guidelines: Diagnostic Criteria for Nonviable Pregnancy Early in the First Trimester. Malva Limes Med 2013; 086:5784-69. 2. Moderate volume of simple fluid in the pelvis of uncertain significance. Electronically Signed   By: Mitzi Hansen M.D.   On: 04/11/2017 01:36   MAU Course  Procedures  MDM Labs and [place] ordered and reviewed. No IUP seen on [place], most likely early  pregnancy but cannot exclude failed or ectopic pregnancy. Will follow quant in 48 hrs. Stable for discharge home.  Assessment and Plan   1. Pregnancy of unknown anatomic location   2. Abdominal cramping affecting pregnancy    Discharge home Follow up in [facility] on 04/13/17 @0830  Ectopic/return precautions  Allergies as of 04/11/2017   No Known Allergies     Medication List    STOP taking these medications   ibuprofen 200 MG tablet Commonly known as:  ADVIL,MOTRIN   JUNEL FE 24 1-20 MG-MCG(24) tablet Generic drug:  Norethindrone Acetate-Ethinyl Estrad-FE   metFORMIN 500 MG 24 hr tablet Commonly known as:  GLUCOPHAGE-XR     TAKE these medications   BENADRYL 25  MG tablet Generic drug:  diphenhydrAMINE Take 25 mg by mouth at bedtime as needed for allergies.      Donette Larry, CNM 04/11/2017, 2:02 AM

## 2017-04-11 NOTE — Discharge Instructions (Signed)

## 2017-04-12 LAB — GC/CHLAMYDIA PROBE AMP (~~LOC~~) NOT AT ARMC
Chlamydia: NEGATIVE
Neisseria Gonorrhea: NEGATIVE

## 2017-04-13 ENCOUNTER — Ambulatory Visit: Payer: Medicaid Other | Admitting: General Practice

## 2017-04-13 ENCOUNTER — Encounter: Payer: Self-pay | Admitting: General Practice

## 2017-04-13 DIAGNOSIS — O3680X0 Pregnancy with inconclusive fetal viability, not applicable or unspecified: Secondary | ICD-10-CM

## 2017-04-13 LAB — HCG, QUANTITATIVE, PREGNANCY: HCG, BETA CHAIN, QUANT, S: 1644 m[IU]/mL — AB (ref <5)

## 2017-04-13 NOTE — Progress Notes (Signed)
Patient here for stat bhcg today. Patient reports continued menstrual cramps since MAU visit but they have improved in severity. Patient denies bleeding. Discussed with patient we are monitoring her bhcg levels today and asked she wait in lobby for results/updated plan of care. Patient verbalized understanding to all & had no questions at this time.  Reviewed results with Susanne BordersKathyrn Kooistra who finds appropriate rise in bhcg levels, patient should have follow up ultrasound in 2 weeks. Scheduled for 4/1.  Informed patient of results & recommended follow up ultrasound. Ectopic precautions reviewed with patient. Patient verbalized understanding to all & had no questions

## 2017-04-14 NOTE — Progress Notes (Signed)
Chart reviewed for nurse visit. Agree with plan of care.   Marylene LandKooistra, Krystie Leiter Lorraine, CNM 04/14/2017 1:22 PM

## 2017-04-25 ENCOUNTER — Inpatient Hospital Stay (HOSPITAL_COMMUNITY): Payer: Medicaid Other

## 2017-04-25 ENCOUNTER — Telehealth: Payer: Self-pay | Admitting: General Practice

## 2017-04-25 ENCOUNTER — Inpatient Hospital Stay (HOSPITAL_COMMUNITY)
Admission: AD | Admit: 2017-04-25 | Discharge: 2017-04-25 | Disposition: A | Discharge status: Home / Self Care | Payer: Medicaid Other | Source: Ambulatory Visit | Attending: Obstetrics and Gynecology | Admitting: Obstetrics and Gynecology

## 2017-04-25 ENCOUNTER — Encounter (HOSPITAL_COMMUNITY): Payer: Self-pay

## 2017-04-25 DIAGNOSIS — O30101 Triplet pregnancy, unspecified number of placenta and unspecified number of amniotic sacs, first trimester: Secondary | ICD-10-CM | POA: Insufficient documentation

## 2017-04-25 DIAGNOSIS — O26851 Spotting complicating pregnancy, first trimester: Secondary | ICD-10-CM | POA: Insufficient documentation

## 2017-04-25 DIAGNOSIS — O209 Hemorrhage in early pregnancy, unspecified: Secondary | ICD-10-CM

## 2017-04-25 DIAGNOSIS — O210 Mild hyperemesis gravidarum: Secondary | ICD-10-CM | POA: Insufficient documentation

## 2017-04-25 DIAGNOSIS — Z3A01 Less than 8 weeks gestation of pregnancy: Secondary | ICD-10-CM | POA: Insufficient documentation

## 2017-04-25 DIAGNOSIS — O30121 Triplet pregnancy with two or more monoamniotic fetuses, first trimester: Secondary | ICD-10-CM

## 2017-04-25 DIAGNOSIS — O98311 Other infections with a predominantly sexual mode of transmission complicating pregnancy, first trimester: Principal | ICD-10-CM

## 2017-04-25 DIAGNOSIS — O26859 Spotting complicating pregnancy, unspecified trimester: Secondary | ICD-10-CM

## 2017-04-25 DIAGNOSIS — A6009 Herpesviral infection of other urogenital tract: Secondary | ICD-10-CM

## 2017-04-25 LAB — URINALYSIS, ROUTINE W REFLEX MICROSCOPIC
Bilirubin Urine: NEGATIVE
Glucose, UA: NEGATIVE mg/dL
Ketones, ur: NEGATIVE mg/dL
Leukocytes, UA: NEGATIVE
Nitrite: NEGATIVE
PROTEIN: NEGATIVE mg/dL
SPECIFIC GRAVITY, URINE: 1.008 (ref 1.005–1.030)
pH: 7 (ref 5.0–8.0)

## 2017-04-25 MED ORDER — METOCLOPRAMIDE HCL 10 MG PO TABS: 10.0000 mg | ORAL_TABLET | Freq: Four times a day (QID) | ORAL | 0 refills | Status: DC

## 2017-04-25 MED ORDER — ONDANSETRON 8 MG PO TBDP
8.0000 mg | ORAL_TABLET | Freq: Once | ORAL | Status: AC
  Administered 2017-04-25: 8 mg via ORAL
  Filled 2017-04-25: qty 1

## 2017-04-25 MED ORDER — RANITIDINE HCL 150 MG PO TABS: 150.0000 mg | ORAL_TABLET | Freq: Two times a day (BID) | ORAL | 1 refills | Status: DC

## 2017-04-25 MED ORDER — PROMETHAZINE HCL 25 MG PO TABS: 25.0000 mg | ORAL_TABLET | Freq: Four times a day (QID) | ORAL | 3 refills | Status: DC | PRN

## 2017-04-25 MED ORDER — VALACYCLOVIR HCL 1 G PO TABS: 1000.0000 mg | ORAL_TABLET | Freq: Every day | ORAL | 4 refills | Status: AC

## 2017-04-25 NOTE — Discharge Instructions (Signed)

## 2017-04-25 NOTE — Telephone Encounter (Signed)
Patient called and left message on voicemail line stating she is now pregnant and needs a refill on her genital herpes medication. Called patient and she states she needs refills on her valtrex for when she has an outbreak. Patient states she isn't currently having one but wants to be prepared. Per Dr Nira Retortegele, may provide valtrex 1 gram x 5 days with 4 refills. Informed patient. Patient verbalized understanding to all & had no questions

## 2017-04-25 NOTE — MAU Provider Note (Signed)
History     CSN: [id number]  Arrival date and time: 04/25/17 4098   First Provider Initiated Contact with Patient 04/25/17 0533      Chief Complaint  Patient presents with  . Vaginal Bleeding   HPI  Ms.  Julia Potter is a 27 y.o. year old G83P0110 female at 5.[redacted] weeks gestation by LMP who presents to MAU reporting spotting when wiping that started about 1 hour before her arrival. She denies pain. She also has a c/o N/V, but does not have a Rx for N/V. She reports having SI 2 days ago, but no bleeding seen then.  Past Medical History:  Diagnosis Date  . ALLERGIC RHINITIS   . Anxiety   . Chlamydia   . Depression    doing ok, in therapy  . Herpes   . Pregnancy induced hypertension     Past Surgical History:  Procedure Laterality Date  . INTRAUTERINE DEVICE INSERTION  09/2010  . NO PAST SURGERIES      Family History  Problem Relation Age of Onset  . Diabetes Maternal Grandmother   . Asthma Daughter     Social History   Tobacco Use  . Smoking status: Never Smoker  . Smokeless tobacco: Never Used  Substance Use Topics  . Alcohol use: Yes    Comment: 2 per week  . Drug use: No    Allergies: No Known Allergies  Medications Prior to Admission  Medication Sig Dispense Refill Last Dose  . diphenhydrAMINE (BENADRYL) 25 MG tablet Take 25 mg by mouth at bedtime as needed for allergies.   Unknown at Unknown time    Review of Systems  Constitutional: Negative.   HENT: Negative.   Eyes: Negative.   Respiratory: Negative.   Cardiovascular: Negative.   Gastrointestinal: Positive for nausea and vomiting.  Endocrine: Negative.   Genitourinary: Positive for vaginal bleeding (spotting).  Musculoskeletal: Negative.   Skin: Negative.   Allergic/Immunologic: Negative.   Neurological: Negative.   Hematological: Negative.   Psychiatric/Behavioral: Negative.    Physical Exam   Blood pressure 116/77, pulse 93, temperature 98.5 F (36.9 C), temperature source Oral,  resp. rate 20, last menstrual period 03/15/2017, SpO2 100 %.  Physical Exam  Nursing note and vitals reviewed. Constitutional: She is oriented to person, place, and time. She appears well-developed and well-nourished.  HENT:  Head: Normocephalic and atraumatic.  Eyes: Pupils are equal, round, and reactive to light.  Neck: Normal range of motion. Neck supple.  Cardiovascular: Normal rate, regular rhythm and normal heart sounds.  Respiratory: Effort normal and breath sounds normal.  GI: Soft. Bowel sounds are normal.  Genitourinary:  Genitourinary Comments: Uterus: non-tender, SE: cervix is smooth, pink, no lesions, very scant light tannish-pink blood -- WP, GC/CT done, closed/long/firm, no CMT or friability, no adnexal tenderness   Musculoskeletal: Normal range of motion.  Neurological: She is alert and oriented to person, place, and time.  Skin: Skin is warm and dry.  Psychiatric: She has a normal mood and affect. Her behavior is normal. Judgment and thought content normal.    MAU Course  Procedures  MDM CC[facility] OB < 14 wks U/S with TV Zofran 8 mg ODT  Results for orders placed or performed during the hospital encounter of 04/25/17 (from the past 24 hour(s))  Urinalysis, Routine w reflex microscopic     Status: Abnormal   Collection Time: 04/25/17  4:45 AM  Result Value Ref Range   Color, Urine YELLOW YELLOW   APPearance HAZY (A) CLEAR  Specific Gravity, Urine 1.008 1.005 - 1.030   pH 7.0 5.0 - 8.0   Glucose, UA NEGATIVE NEGATIVE mg/dL   Hgb urine dipstick LARGE (A) NEGATIVE   Bilirubin Urine NEGATIVE NEGATIVE   Ketones, ur NEGATIVE NEGATIVE mg/dL   Protein, ur NEGATIVE NEGATIVE mg/dL   Nitrite NEGATIVE NEGATIVE   Leukocytes, UA NEGATIVE NEGATIVE   RBC / HPF 0-5 0 - 5 RBC/hpf   WBC, UA 0-5 0 - 5 WBC/hpf   Bacteria, UA FEW (A) NONE SEEN   Squamous Epithelial / LPF 0-5 (A) NONE SEEN   Mucus PRESENT     US Ob Comp Addl Gest Less 14 Wks  Result Date: 04/25/2017 CLINICAL  DATA:  Pregnancy.  Bleeding.  Follow-up exam. EXAM: TRIPLET OBSTETRIC <14WK ULTRASOUND AND TRANSVAGINAL OB US COMPARISON:  04/11/2017. FINDINGS: Number of IUPs:  3 TRIPLET 1 Yolk sac:  Present Embryo:  Present Cardiac Activity: Present Heart Rate: Too small to trace CRL: 2.7 mm corresponding to 5 weeks 5 days [place] EDC: 12/21/2017 TRIPLET 2 Yolk sac:  Present Embryo:  Present Cardiac Activity: Present Heart Rate: Too small to trace bpm CRL: 2.1 mm corresponding to 5 weeks 5 days [place] EDC: 12/21/2017 TRIPLET 3 Yolk sac:  Present Embryo:  Present Cardiac Activity: Present Heart Rate: Too small to trace bpm CRL: 2.4 mm corresponding to 5 weeks 5 days [place] EDC: 12/21/2017 Subchorionic hemorrhage:  None visualized. Maternal uterus/adnexae: Tiny bilateral ovarian cysts, most likely corpus luteal cysts are present. Small amount of free pelvic fluid. IMPRESSION: 1. Triplet pregnancy. Triplets appear viable with a gestational age of [redacted] weeks 5 days. 2.  Small amount of free pelvic fluid. Electronically Signed   By: Maisie Fus  Register   On: 04/25/2017 06:43   US Ob Comp Addl Gest Less 14 Wks  Result Date: 04/25/2017 CLINICAL DATA:  Pregnancy.  Bleeding.  Follow-up exam. EXAM: TRIPLET OBSTETRIC <14WK ULTRASOUND AND TRANSVAGINAL OB US COMPARISON:  04/11/2017. FINDINGS: Number of IUPs:  3 TRIPLET 1 Yolk sac:  Present Embryo:  Present Cardiac Activity: Present Heart Rate: Too small to trace CRL: 2.7 mm corresponding to 5 weeks 5 days [place] EDC: 12/21/2017 TRIPLET 2 Yolk sac:  Present Embryo:  Present Cardiac Activity: Present Heart Rate: Too small to trace bpm CRL: 2.1 mm corresponding to 5 weeks 5 days [place] EDC: 12/21/2017 TRIPLET 3 Yolk sac:  Present Embryo:  Present Cardiac Activity: Present Heart Rate: Too small to trace bpm CRL: 2.4 mm corresponding to 5 weeks 5 days [place] EDC: 12/21/2017 Subchorionic hemorrhage:  None visualized. Maternal uterus/adnexae: Tiny bilateral ovarian cysts, most likely corpus luteal cysts are present. Small  amount of free pelvic fluid. IMPRESSION: 1. Triplet pregnancy. Triplets appear viable with a gestational age of [redacted] weeks 5 days. 2.  Small amount of free pelvic fluid. Electronically Signed   By: Maisie Fus  Register   On: 04/25/2017 06:43   US Ob Transvaginal  Result Date: 04/25/2017 CLINICAL DATA:  Pregnancy.  Bleeding.  Follow-up exam. EXAM: TRIPLET OBSTETRIC <14WK ULTRASOUND AND TRANSVAGINAL OB US COMPARISON:  04/11/2017. FINDINGS: Number of IUPs:  3 TRIPLET 1 Yolk sac:  Present Embryo:  Present Cardiac Activity: Present Heart Rate: Too small to trace CRL: 2.7 mm corresponding to 5 weeks 5 days [place] EDC: 12/21/2017 TRIPLET 2 Yolk sac:  Present Embryo:  Present Cardiac Activity: Present Heart Rate: Too small to trace bpm CRL: 2.1 mm corresponding to 5 weeks 5 days [place] EDC: 12/21/2017 TRIPLET 3 Yolk sac:  Present Embryo:  Present Cardiac  Activity: Present Heart Rate: Too small to trace bpm CRL: 2.4 mm corresponding to 5 weeks 5 days US EDC: 12/21/2017 Subchorionic hemorrhage:  None visualized. Ma[place] uterus/adnexae: Tiny bilateral ovarian cysts, most likely corpus luteal cysts are present. Small amount of free pelvic fluid. IMPRESSION: 1. Triplet pregnancy. Triplets appear viable with a gestational age of [redacted] weeks 5 days. 2.  Small amount of free pelvic fluid. Electronically Signed   By: Maisie Fushomas  Register   On: 04/25/2017 06:43     Assessment and Plan  Triplet gestation with two or more monoamniotic fetuses in first trimester  - Advised of this being a high risk pregnancy and [facility] - Pt expresses desire to come to [facility] - Advised to call to schedule an appt in 4 wks, if no problems arise before then - Information provided on triplet pregnancy   Spotting in early pregnancy  - Bleeding precautions discussed - Advised to return to MAU for increased bleeding (soaking 1-2 pads/hr) - Advised of higher risk for miscarriage with bleeding in early pregnancy and multiples  Morning  sickness  - Rx for Phenergan 25 mg po every 6 hrs prn N/V - Rx for Reglan 10 mg every 6 hrs  - Rx for Zantac 150 mg BID  - Discharge patient - Patient verbalized an understanding of the plan of care and agrees.   Raelyn Moraolitta Cheronda Erck, MSN, CNM 04/25/2017, 5:46 AM

## 2017-04-25 NOTE — MAU Note (Signed)
Pt reports some vaginal bleeding when wiping that started 30 mins prior to arrival. Pt denies pain. Pt states she also has nausea and vomiting. Does not have RX for nausea medication.

## 2017-04-26 ENCOUNTER — Telehealth: Payer: Self-pay | Admitting: General Practice

## 2017-04-26 NOTE — Telephone Encounter (Signed)
Called patient to schedule New OB appt.  Patient is pregnant with triplets.  Patient is scheduled for 05/29/17 at 8:35am.

## 2017-05-01 ENCOUNTER — Ambulatory Visit (HOSPITAL_COMMUNITY)
Admission: RE | Admit: 2017-05-01 | Discharge: 2017-05-01 | Disposition: A | Discharge status: Home / Self Care | Payer: Medicaid Other | Source: Ambulatory Visit | Attending: Student | Admitting: Student

## 2017-05-01 ENCOUNTER — Ambulatory Visit (INDEPENDENT_AMBULATORY_CARE_PROVIDER_SITE_OTHER): Payer: Medicaid Other | Admitting: Family Medicine

## 2017-05-01 ENCOUNTER — Encounter: Payer: Self-pay | Admitting: Family Medicine

## 2017-05-01 DIAGNOSIS — O30101 Triplet pregnancy, unspecified number of placenta and unspecified number of amniotic sacs, first trimester: Secondary | ICD-10-CM | POA: Diagnosis not present

## 2017-05-01 DIAGNOSIS — Z3A01 Less than 8 weeks gestation of pregnancy: Secondary | ICD-10-CM | POA: Diagnosis not present

## 2017-05-01 DIAGNOSIS — O3680X0 Pregnancy with inconclusive fetal viability, not applicable or unspecified: Secondary | ICD-10-CM

## 2017-05-01 DIAGNOSIS — O283 Abnormal ultrasonic finding on antenatal screening of mother: Secondary | ICD-10-CM | POA: Insufficient documentation

## 2017-05-01 DIAGNOSIS — Z9889 Other specified postprocedural states: Secondary | ICD-10-CM

## 2017-05-01 DIAGNOSIS — O3680X1 Pregnancy with inconclusive fetal viability, fetus 1: Secondary | ICD-10-CM

## 2017-05-01 DIAGNOSIS — O021 Missed abortion: Secondary | ICD-10-CM

## 2017-05-01 NOTE — Progress Notes (Signed)
   Subjective:    Patient ID: Julia Potter, female    DOB: 14-Aug-1990, 27 y.o.   MRN: [id number]  HPI Patient seen for ultrasound results.  Patient was seen on 3/26 due to vaginal bleeding.  Ultrasound at that time showed 3 intrauterine pregnancies, measuring approximately 5 weeks and 5 days with heart rates that were too small to trace.  She had repeat ultrasound today which showed baby 1 and baby 2 monochorionic and probably diamniotic and triplet 3 with a separate thick chorion.  Baby one had a heart rate of 80 and baby 2 and 3 did not have cardiac activity.   Review of Systems     Objective:   Physical Exam  Constitutional: She is oriented to person, place, and time. She appears well-developed and well-nourished.  Cardiovascular: Normal rate.  Pulmonary/Chest: Effort normal.  Neurological: She is alert and oriented to person, place, and time.  Skin: Skin is warm and dry.  Psychiatric: She has a normal mood and affect. Her behavior is normal. Judgment and thought content normal.       Assessment & Plan:  1. Encounter to determine fetal viability of pregnancy, fetus 1 of multiple gestation Discussed with the patient's ultrasound results and the lack of cardiac activity may be 2 and 3 has signs of failed pregnancy.  I also discussed that low cardiac activity may be evidence of impending miscarriage.  Will repeat ultrasound in 7-10 days to evaluate viability. - US OB Transvaginal; Future

## 2017-05-01 NOTE — Progress Notes (Signed)
Scheduled follow up ultrasound 4/10 @ 9am.

## 2017-05-10 ENCOUNTER — Ambulatory Visit (INDEPENDENT_AMBULATORY_CARE_PROVIDER_SITE_OTHER): Payer: Medicaid Other | Admitting: Advanced Practice Midwife

## 2017-05-10 ENCOUNTER — Ambulatory Visit (HOSPITAL_COMMUNITY)
Admission: RE | Admit: 2017-05-10 | Discharge: 2017-05-10 | Disposition: A | Discharge status: Home / Self Care | Payer: Medicaid Other | Source: Ambulatory Visit | Attending: Family Medicine | Admitting: Family Medicine

## 2017-05-10 DIAGNOSIS — O30101 Triplet pregnancy, unspecified number of placenta and unspecified number of amniotic sacs, first trimester: Secondary | ICD-10-CM | POA: Diagnosis present

## 2017-05-10 DIAGNOSIS — O26899 Other specified pregnancy related conditions, unspecified trimester: Secondary | ICD-10-CM

## 2017-05-10 DIAGNOSIS — O284 Abnormal radiological finding on antenatal screening of mother: Secondary | ICD-10-CM | POA: Insufficient documentation

## 2017-05-10 DIAGNOSIS — Z3A01 Less than 8 weeks gestation of pregnancy: Secondary | ICD-10-CM | POA: Insufficient documentation

## 2017-05-10 DIAGNOSIS — O3680X1 Pregnancy with inconclusive fetal viability, fetus 1: Secondary | ICD-10-CM

## 2017-05-10 DIAGNOSIS — O3680X0 Pregnancy with inconclusive fetal viability, not applicable or unspecified: Secondary | ICD-10-CM | POA: Diagnosis not present

## 2017-05-10 DIAGNOSIS — O3680X3 Pregnancy with inconclusive fetal viability, fetus 3: Secondary | ICD-10-CM

## 2017-05-10 DIAGNOSIS — Z6791 Unspecified blood type, Rh negative: Secondary | ICD-10-CM | POA: Diagnosis not present

## 2017-05-10 HISTORY — DX: Unspecified blood type, rh negative: Z67.91

## 2017-05-10 HISTORY — DX: Other specified pregnancy related conditions, unspecified trimester: O26.899

## 2017-05-10 NOTE — Progress Notes (Signed)
Pt here for [place] results.  Summary of all [place] results  04/11/17: Trace fluid in endometrial cavity 04/25/17:   Triplet 1: CRL: 2.7 mm corresponding to 5 weeks 5 days.     Heart Rate: Too small to trace  Triplet 2: CRL: 2.1 mm corresponding to 5 weeks 5 days.     Heart Rate: Too small to trace  Triplet 3: CRL: 2.4 mm corresponding to 5 weeks 5 days.     Heart Rate: Too small to trace 05/01/17:  Triplet 1: CRL: 5 mm corresponding to 6 weeks 1 days.     Heart Rate: 80 bpm  Triplet 2: CRL: 3 mm corresponding to 5 weeks 5 days.     Heart Rate: Not visualized  Triplet 3: CRL: 5 mm corresponding to 6 weeks 1 days.     Heart Rate: Not visualized 05/10/17:  Triplet 1: CRL: 4.43 mm corresponding to 6 weeks 1 days.     Heart Rate: Not visualized  Triplet 2: CRL: 3.19 mm corresponding to 5 weeks 6 days.     Heart Rate: Not visualized  Triplet 3: CRL: 4.96 mm corresponding to 6 weeks 1 days.     Heart Rate: Not visualized     US Ob Transvaginal  Addendum Date: 05/10/2017   ADDENDUM REPORT: 05/10/2017 10:30 ADDENDUM: Comparison is made to prior studies from 05/01/2017 and 04/25/2017. The embryos have enlarged slightly since 04/25/2017, but are similar or slightly smaller compared to 05/01/2017. Findings are highly suspicious but not yet definitive for failed pregnancy. Recommend follow-up US in 10-14 days for definitive diagnosis. This recommendation follows SRU consensus guidelines: Diagnostic Criteria for Nonviable Pregnancy Early in the First Trimester. Malva Limes Med 2013; 161:0960-45. Electronically Signed   By: Charlett Nose M.D.   On: 05/10/2017 10:30   Result Date: 05/10/2017 CLINICAL DATA:  Triplet pregnancy with inconclusive viability EXAM: TRANSVAGINAL OBSTETRIC <14 WK ULTRASOUND TRIPLET COMPARISON:  None. FINDINGS: Number of IUPs:  3 TRIPLET 1 Yolk sac:  Visualized Embryo:  Visualized Cardiac Activity: Not visualized Heart Rate:   Bpm MSD:  w  d CRL:   4.43 mm, 6w 1d [place] EDC: 01/02/2018 TRIPLET 2  Yolk sac:  Visualized Embryo:  Visualized Cardiac Activity: Not visualized Heart Rate:  Bpm MSD: w  d CRL:   3.19 mm, 5w 6d [place] EDC: 01/04/2018 TRIPLET 3 Yolk sac:  Visualized Embryo:  Visualized Cardiac Activity: Not visualized Heart Rate:  Bpm MSD: w  d CRL:   4.96, 6w 1d [place] EDC: 01/02/2018 Subchorionic hemorrhage:  None visualized. Maternal uterus/adnexae:  No adnexal mass.  No free fluid. IMPRESSION: Triplet pregnancy. 3 fetal poles are visualized. No cardiac activity detected in any of the embryos currently. Recommend continued follow-up with repeat ultrasound in 14 days to ensure expected progression. Electronically Signed: By: Charlett Nose M.D. On: 05/10/2017 10:02   Called radiologist to review today's [place] in comparison w/ previous US's since there was no progression from last [place]. States findings are highly suspicious but not yet definitive for failed pregnancy. Recommends follow-up US in 10-14 days for definitive diagnosis. Discussed w/ pt who is understandably distressed by lack certainty about what is happening with her pregnancy. CNM explained that lack of growth or visualization of FHRs since last [place] and minimal progression since 3/26 is very concerning, but that great care is taken to not prematurely Dx a missed AB. Pt verbalized understanding and appreciation for explanation.  F/U US scheduled 05/22/17.    First trimester and SAB  precautions.  Katrinka BlazingSmith, [place], CNM 05/10/2017 2:34 PM   Addendum: On review of chart  Pt did not get Rhophylac on 3/26, possibly due to early GA and very scant bleeding. Will consider Rhophylac when she returns.

## 2017-05-10 NOTE — Patient Instructions (Signed)

## 2017-05-19 ENCOUNTER — Encounter (HOSPITAL_COMMUNITY): Payer: Self-pay | Admitting: *Deleted

## 2017-05-19 ENCOUNTER — Inpatient Hospital Stay (HOSPITAL_COMMUNITY)
Admission: AD | Admit: 2017-05-19 | Discharge: 2017-05-19 | Disposition: A | Discharge status: Home / Self Care | Payer: Medicaid Other | Source: Ambulatory Visit | Attending: Family Medicine | Admitting: Family Medicine

## 2017-05-19 DIAGNOSIS — O219 Vomiting of pregnancy, unspecified: Secondary | ICD-10-CM

## 2017-05-19 DIAGNOSIS — O210 Mild hyperemesis gravidarum: Secondary | ICD-10-CM | POA: Diagnosis present

## 2017-05-19 DIAGNOSIS — Z3A09 9 weeks gestation of pregnancy: Secondary | ICD-10-CM | POA: Insufficient documentation

## 2017-05-19 LAB — URINALYSIS, ROUTINE W REFLEX MICROSCOPIC
Bilirubin Urine: NEGATIVE
Glucose, UA: NEGATIVE mg/dL
HGB URINE DIPSTICK: NEGATIVE
Ketones, ur: 80 mg/dL — AB
Leukocytes, UA: NEGATIVE
Nitrite: NEGATIVE
PROTEIN: 100 mg/dL — AB
SPECIFIC GRAVITY, URINE: 1.032 — AB (ref 1.005–1.030)
pH: 6 (ref 5.0–8.0)

## 2017-05-19 MED ORDER — SODIUM CHLORIDE 0.9 % IV SOLN
8.0000 mg | Freq: Once | INTRAVENOUS | Status: AC
  Administered 2017-05-19: 8 mg via INTRAVENOUS
  Filled 2017-05-19: qty 4

## 2017-05-19 MED ORDER — LACTATED RINGERS IV BOLUS
1000.0000 mL | Freq: Once | INTRAVENOUS | Status: AC
  Administered 2017-05-19: 1000 mL via INTRAVENOUS

## 2017-05-19 MED ORDER — ONDANSETRON 8 MG PO TBDP: 8.0000 mg | ORAL_TABLET | Freq: Three times a day (TID) | ORAL | 1 refills | Status: DC | PRN

## 2017-05-19 MED ORDER — M.V.I. ADULT IV INJ
Freq: Once | INTRAVENOUS | Status: AC
  Administered 2017-05-19: 21:00:00 via INTRAVENOUS
  Filled 2017-05-19: qty 10

## 2017-05-19 NOTE — Progress Notes (Signed)
   05/19/17 2139  Vital Signs  BP (!) 142/85  Pulse Rate (!) 105  Resp 18  Temp 98.5 F (36.9 C)  Temp Source Oral  Pt BP as above. Pt was pre E with previous pregnancy, was on med post delivered and DC. Pt encouraged to discuss today's findings with MD and follow up

## 2017-05-19 NOTE — MAU Note (Signed)
Pt could not leave urine sample

## 2017-05-19 NOTE — MAU Note (Signed)
Pt can't keep anything down and her urine is dark. No pain or bleeding

## 2017-05-19 NOTE — MAU Provider Note (Signed)
History     CSN: [id number]  Arrival date and time: 05/19/17 1622   First Provider Initiated Contact with Patient 05/19/17 1936      Chief Complaint  Patient presents with  . Emesis   HPI Julia Potter is a 27 y.o. G3P0110 at 3079w2d who presents with nausea and vomiting. She states this is an ongoing issue during the pregnancy but for the last "couple of days" it has been worse. She reports not being able to keep down any food or liquid. She has tried phenergan for the nausea but states she vomits that as well. She denies any pain, vaginal bleeding or discharge.   OB History    Gravida  3   Para  1   Term  0   Preterm  1   AB  1   Living  0     SAB  1   TAB  0   Ectopic  0   Multiple  0   Live Births  0        Obstetric Comments  G1P0101 [facility]. Delivered a viable female at 34 weeks        Past Medical History:  Diagnosis Date  . ALLERGIC RHINITIS   . Anxiety   . Chlamydia   . Depression    doing ok, in therapy  . Herpes   . Pregnancy induced hypertension     Past Surgical History:  Procedure Laterality Date  . INTRAUTERINE DEVICE INSERTION  09/2010  . NO PAST SURGERIES      Family History  Problem Relation Age of Onset  . Diabetes Maternal Grandmother   . Asthma Daughter     Social History   Tobacco Use  . Smoking status: Never Smoker  . Smokeless tobacco: Never Used  Substance Use Topics  . Alcohol use: Yes    Comment: 2 per week  . Drug use: No    Allergies: No Known Allergies  Medications Prior to Admission  Medication Sig Dispense Refill Last Dose  . Simethicone (GAS-X PO) Take 1 tablet by mouth daily as needed (gas).   05/16/2017  . metoCLOPramide (REGLAN) 10 MG tablet Take 1 tablet (10 mg total) by mouth every 6 (six) hours. (Patient not taking: Reported on 05/19/2017) 30 tablet 0 Not Taking at Unknown time  . promethazine (PHENERGAN) 25 MG tablet Take 1 tablet (25 mg total) by mouth every 6 (six)  hours as needed for nausea or vomiting. (Patient not taking: Reported on 05/19/2017) 30 tablet 3 Not Taking at Unknown time  . ranitidine (ZANTAC) 150 MG tablet Take 1 tablet (150 mg total) by mouth 2 (two) times daily. (Patient not taking: Reported on 05/19/2017) 60 tablet 1 Not Taking at Unknown time    Review of Systems  Constitutional: Negative.  Negative for fatigue and fever.  HENT: Negative.   Respiratory: Negative.  Negative for shortness of breath.   Cardiovascular: Negative.  Negative for chest pain.  Gastrointestinal: Positive for nausea and vomiting. Negative for abdominal pain, constipation and diarrhea.  Genitourinary: Negative.  Negative for dysuria.  Neurological: Negative.  Negative for dizziness and headaches.   Physical Exam   Blood pressure 136/72, pulse 94, temperature 98.1 F (36.7 C), temperature source Oral, resp. rate 18, height 5\' 2"  (1.575 m), weight 222 lb (100.7 kg), last menstrual period 03/15/2017, SpO2 100 %.  Physical Exam  Nursing note and vitals reviewed. Constitutional: She is oriented to person, place, and time. She  appears well-developed and well-nourished. No distress.  HENT:  Head: Normocephalic.  Eyes: Pupils are equal, round, and reactive to light.  Cardiovascular: Normal rate, regular rhythm and normal heart sounds.  Respiratory: Effort normal and breath sounds normal. No respiratory distress.  GI: Soft. Bowel sounds are normal. She exhibits no distension. There is no tenderness.  Neurological: She is alert and oriented to person, place, and time.  Skin: Skin is warm and dry.  Psychiatric: She has a normal mood and affect. Her behavior is normal. Judgment and thought content normal.    MAU Course  Procedures  MDM UA- patient unable to leave urine sample LR bolus Zofran 8mg  IV Multivitamins in LR  Assessment and Plan   1. Nausea and vomiting during pregnancy   2. [redacted] weeks gestation of pregnancy    -Discharge home in stable  condition -Rx for zofran given to patient per patient request, risks reviewed -BRAT diet and n/v precautions discussed -Patient advised to follow-up with [facility] as scheduled for follow up u/s -Patient may return to MAU as needed or if her condition were to change or worsen  Rolm Bookbinder CNM 05/19/2017, 8:20 PM

## 2017-05-19 NOTE — Discharge Instructions (Signed)
Morning Sickness °Morning sickness is when you feel sick to your stomach (nauseous) during pregnancy. This nauseous feeling may or may not come with vomiting. It often occurs in the morning but can be a problem any time of day. Morning sickness is most common during the first trimester, but it may continue throughout pregnancy. While morning sickness is unpleasant, it is usually harmless unless you develop severe and continual vomiting (hyperemesis gravidarum). This condition requires more intense treatment. °What are the causes? °The cause of morning sickness is not completely known but seems to be related to normal hormonal changes that occur in pregnancy. °What increases the risk? °You are at greater risk if you: °· Experienced nausea or vomiting before your pregnancy. °· Had morning sickness during a previous pregnancy. °· Are pregnant with more than one baby, such as twins. ° °How is this treated? °Do not use any medicines (prescription, over-the-counter, or herbal) for morning sickness without first talking to your health care provider. Your health care provider may prescribe or recommend: °· Vitamin B6 supplements. °· Anti-nausea medicines. °· The herbal medicine ginger. ° °Follow these instructions at home: °· Only take over-the-counter or prescription medicines as directed by your health care provider. °· Taking multivitamins before getting pregnant can prevent or decrease the severity of morning sickness in most women. °· Eat a piece of dry toast or unsalted crackers before getting out of bed in the morning. °· Eat five or six small meals a day. °· Eat dry and bland foods (rice, baked potato). Foods high in carbohydrates are often helpful. °· Do not drink liquids with your meals. Drink liquids between meals. °· Avoid greasy, fatty, and spicy foods. °· Get someone to cook for you if the smell of any food causes nausea and vomiting. °· If you feel nauseous after taking prenatal vitamins, take the vitamins at  night or with a snack. °· Snack on protein foods (nuts, yogurt, cheese) between meals if you are hungry. °· Eat unsweetened gelatins for desserts. °· Wearing an acupressure wristband (worn for sea sickness) may be helpful. °· Acupuncture may be helpful. °· Do not smoke. °· Get a humidifier to keep the air in your house free of odors. °· Get plenty of fresh air. °Contact a health care provider if: °· Your home remedies are not working, and you need medicine. °· You feel dizzy or lightheaded. °· You are losing weight. °Get help right away if: °· You have persistent and uncontrolled nausea and vomiting. °· You pass out (faint). °This information is not intended to replace advice given to you by your health care provider. Make sure you discuss any questions you have with your health care provider. °Document Released: 03/10/2006 Document Revised: 06/25/2015 Document Reviewed: 07/04/2012 °Elsevier Interactive Patient Education © 2017 [employer] °Safe Medications in Pregnancy  ° °Acne: °Benzoyl Peroxide °Salicylic Acid ° °Backache/Headache: °Tylenol: 2 regular strength every 4 hours OR °             2 Extra strength every 6 hours ° °Colds/Coughs/Allergies: °Benadryl (alcohol free) 25 mg every 6 hours as needed °Breath right strips °Claritin °Cepacol throat lozenges °Chloraseptic throat spray °Cold-Eeze- up to three times per day °Cough drops, alcohol free °Flonase (by prescription only) °Guaifenesin °Mucinex °Robitussin DM (plain only, alcohol free) °Saline nasal spray/drops °Sudafed (pseudoephedrine) & Actifed ** use only after [redacted] weeks gestation and if you do not have high blood pressure °Tylenol °Vicks Vaporub °Zinc lozenges °Zyrtec  ° °Constipation: °Colace °Ducolax suppositories °Fleet enema °  Glycerin suppositories °Metamucil °Milk of magnesia °Miralax °Senokot °Smooth move tea ° °Diarrhea: °Kaopectate °Imodium A-D ° °*NO pepto Bismol ° °Hemorrhoids: °Anusol °Anusol HC °Preparation  H °Tucks ° °Indigestion: °Tums °Maalox °Mylanta °Zantac  °Pepcid ° °Insomnia: °Benadryl (alcohol free) 25mg every 6 hours as needed °Tylenol PM °Unisom, no Gelcaps ° °Leg Cramps: °Tums °MagGel ° °Nausea/Vomiting:  °Bonine °Dramamine °Emetrol °Ginger extract °Sea bands °Meclizine  °Nausea medication to take during pregnancy:  °Unisom (doxylamine succinate 25 mg tablets) Take one tablet daily at bedtime. If symptoms are not adequately controlled, the dose can be increased to a maximum recommended dose of two tablets daily (1/2 tablet in the morning, 1/2 tablet mid-afternoon and one at bedtime). °Vitamin B6 100mg tablets. Take one tablet twice a day (up to 200 mg per day). ° °Skin Rashes: °Aveeno products °Benadryl cream or 25mg every 6 hours as needed °Calamine Lotion °1% cortisone cream ° °Yeast infection: °Gyne-lotrimin 7 °Monistat 7 ° ° °**If taking multiple medications, please check labels to avoid duplicating the same active ingredients °**take medication as directed on the label °** Do not exceed 4000 mg of tylenol in 24 hours °**Do not take medications that contain aspirin or ibuprofen ° ° ° ° °

## 2017-05-22 ENCOUNTER — Other Ambulatory Visit: Payer: Self-pay | Admitting: Obstetrics and Gynecology

## 2017-05-22 ENCOUNTER — Ambulatory Visit (HOSPITAL_COMMUNITY)
Admission: RE | Admit: 2017-05-22 | Discharge: 2017-05-22 | Disposition: A | Discharge status: Home / Self Care | Payer: Medicaid Other | Source: Ambulatory Visit | Attending: Advanced Practice Midwife | Admitting: Advanced Practice Midwife

## 2017-05-22 ENCOUNTER — Ambulatory Visit (INDEPENDENT_AMBULATORY_CARE_PROVIDER_SITE_OTHER): Payer: Medicaid Other | Admitting: Obstetrics and Gynecology

## 2017-05-22 ENCOUNTER — Telehealth (HOSPITAL_COMMUNITY): Payer: Self-pay

## 2017-05-22 ENCOUNTER — Encounter: Payer: Self-pay | Admitting: Obstetrics and Gynecology

## 2017-05-22 VITALS — BP 122/72 | HR 100 | Ht 62.0 in | Wt 220.0 lb

## 2017-05-22 DIAGNOSIS — O26899 Other specified pregnancy related conditions, unspecified trimester: Principal | ICD-10-CM

## 2017-05-22 DIAGNOSIS — O021 Missed abortion: Secondary | ICD-10-CM | POA: Diagnosis present

## 2017-05-22 DIAGNOSIS — O3680X3 Pregnancy with inconclusive fetal viability, fetus 3: Secondary | ICD-10-CM

## 2017-05-22 DIAGNOSIS — R030 Elevated blood-pressure reading, without diagnosis of hypertension: Secondary | ICD-10-CM | POA: Insufficient documentation

## 2017-05-22 DIAGNOSIS — Z3182 Encounter for Rh incompatibility status: Secondary | ICD-10-CM | POA: Diagnosis not present

## 2017-05-22 DIAGNOSIS — Z6791 Unspecified blood type, Rh negative: Secondary | ICD-10-CM

## 2017-05-22 DIAGNOSIS — O30101 Triplet pregnancy, unspecified number of placenta and unspecified number of amniotic sacs, first trimester: Secondary | ICD-10-CM | POA: Insufficient documentation

## 2017-05-22 DIAGNOSIS — Z3A01 Less than 8 weeks gestation of pregnancy: Secondary | ICD-10-CM | POA: Insufficient documentation

## 2017-05-22 DIAGNOSIS — O3680X0 Pregnancy with inconclusive fetal viability, not applicable or unspecified: Secondary | ICD-10-CM | POA: Diagnosis present

## 2017-05-22 MED ORDER — RHO D IMMUNE GLOBULIN 1500 UNIT/2ML IJ SOSY
300.0000 ug | PREFILLED_SYRINGE | Freq: Once | INTRAMUSCULAR | Status: AC
  Administered 2017-05-22: 300 ug via INTRAMUSCULAR

## 2017-05-22 NOTE — Telephone Encounter (Signed)
Spoke with patient, given surgery date and time. [facility] enter through maternity admissions unit, surgery is scheduled for 9:00am. NPO after midnight. No lotion, no powder, no perfume, a little bit of deodorant is okay.Patient expressed understanding. Advised her to call me back if she had any questions.

## 2017-05-22 NOTE — Progress Notes (Signed)
Obstetrics and Gynecology Established Patient Evaluation Add On Visit  Appointment Date: 05/22/2017  [facility]  Primary Care Provider: Candelaria StagersGonfa, Taye T  Referring Provider: Almon HerculesGonfa, Taye T, MD  Chief Complaint: f/u u/s results   History of Present Illness: Julia Potter is a 27 y.o. African-American [phone] (Patient's last menstrual period was 03/15/2017.), seen for the above chief complaint. Her past medical history is significant for BMI 40s, ?HTN, rh negative.   Patient on nurse schedule for u/s f/u visit but given results I added her to mine. No VB, fevers, chills, cramps.     Review of Systems:  as noted in the History of Present Illness.  Past Medical History:  Past Medical History:  Diagnosis Date  . ALLERGIC RHINITIS   . Anxiety   . Chlamydia   . Depression    doing ok, in therapy  . Herpes   . Pregnancy induced hypertension     Past Surgical History:  Past Surgical History:  Procedure Laterality Date  . INTRAUTERINE DEVICE INSERTION  09/2010    Past Obstetrical History:  OB History  Gravida Para Term Preterm AB Living  3 1 0 1 1 1   SAB TAB Ectopic Multiple Live Births  1 0 0 0 1    # Outcome Date GA Lbr Len/2nd Weight Sex Delivery Anes PTL Lv  3 Current           2 SAB           1 Preterm  [id number]    Vag-Spont       Obstetric Comments  G1P0101 [facility]. Delivered a viable female at 34 weeks    Past Gynecological History: As per HPI.  Social History:  Social History   Socioeconomic History  . Marital status: Single    Spouse name: Not on file  . Number of children: Not on file  . Years of education: Not on file  . Highest education level: Not on file  Occupational History  . Not on file  Social Needs  . Financial resource strain: Not on file  . Food insecurity:    Worry: Not on file    Inability: Not on file  . Transportation needs:    Medical: Not on file    Non-medical: Not on  file  Tobacco Use  . Smoking status: Never Smoker  . Smokeless tobacco: Never Used  Substance and Sexual Activity  . Alcohol use: Yes    Comment: 2 per week  . Drug use: No  . Sexual activity: Yes    Birth control/protection: None  Lifestyle  . Physical activity:    Days per week: Not on file    Minutes per session: Not on file  . Stress: Not on file  Relationships  . Social connections:    Talks on phone: Not on file    Gets together: Not on file    Attends religious service: Not on file    Active member of club or organization: Not on file    Attends meetings of clubs or organizations: Not on file    Relationship status: Not on file  . Intimate partner violence:    Fear of current or ex partner: Not on file    Emotionally abused: Not on file    Physically abused: Not on file    Forced sexual activity: Not on file  Other Topics Concern  . Not on file  Social History Narrative  Lives with mom, sister and brother   Has one daughter born in [address]   Wants to go to law school    Family History:  Family History  Problem Relation Age of Onset  . Diabetes Maternal Grandmother   . Asthma Daughter    Medications Julia Potter had no medications administered during this visit. Current Outpatient Medications  Medication Sig Dispense Refill  . Simethicone (GAS-X PO) Take 1 tablet by mouth daily as needed (gas).    . ondansetron (ZOFRAN ODT) 8 MG disintegrating tablet Take 1 tablet (8 mg total) by mouth every 8 (eight) hours as needed for nausea or vomiting. (Patient not taking: Reported on 05/22/2017) 30 tablet 1   No current facility-administered medications for this visit.     Allergies Patient has no known allergies.   Physical Exam:  BP 122/72   Pulse 100   Ht 5\' 2"  (1.575 m)   Wt 220 lb (99.8 kg)   LMP 03/15/2017   BMI 40.24 kg/m  Body mass index is 40.24 kg/m. General appearance: Well nourished, well developed female in no acute distress.    Laboratory: none  Radiology: CLINICAL DATA:  Patient with triplet pregnancy. Assess for viability.  EXAM: OBSTETRIC <14 WK ULTRASOUND TRIPLET  COMPARISON:  Pelvic ultrasound 04/25/2017; 05/01/2017; 05/10/2017.  FINDINGS: Number of IUPs:  3  TRIPLET 1  Yolk sac:  Visualized.  Embryo:  Visualized.  Cardiac Activity: Not Visualized.  Heart Rate:  0 bpm  CRL: 4.8 mm 6w 1d [place] EDC: 01/14/2018  TRIPLET 2  Yolk sac:  Visualized.  Embryo:  Visualized.  Cardiac Activity: Not Visualized.  Heart Rate: 0 bpm  CRL: 3.5 mm 6w 0d [place] EDC: 01/13/2018  TRIPLET 3  Yolk sac:  Visualized.  Embryo:  Visualized.  Cardiac Activity: Not Visualized.  Heart Rate: 0 bpm  CRL: 4.8 mm 6w 1d [place] EDC: 01/14/2018  Subchorionic hemorrhage:  None visualized.  Maternal uterus/adnexae: No adnexal masses identified. No free fluid in the pelvis.  IMPRESSION: Triplet IUP without fetal heartbeat identified. As this exam is greater than 11 days from the prior exam which demonstrated gestational sac and yolk sac, findings meet definitive criteria for failed pregnancy. This follows SRU consensus guidelines: Diagnostic Criteria for Nonviable Pregnancy Early in the First Trimester. Macy Mis J Med [phone].  These results will be called to the ordering clinician or representative by the Radiologist Assistant, and communication documented in the PACS or zVision Dashboard.   Electronically Signed   By: Annia Belt M.D.   On: 05/22/2017 09:51  Assessment: missed AB x 3. Pt stable  Plan:  1. Transient hypertension F/u with PCP.   2. Missed abortion D/w pt re: u/s results. Options d/w her and I recommended suction d&c in OR, given higher risk of failure with (rPOCs) with exp management and medical management. Request sent to scheduling for suction d&c; I told her that it likely wouldn't be me and with the next available attending and she was fine with  that. Would strongly consider using u/s guidance during procedure. ER precautions.   3. Rh Negative Rhogam today  RTC post op  Cornelia Copa MD Attending [facility])

## 2017-05-23 ENCOUNTER — Encounter (HOSPITAL_COMMUNITY)
Admission: RE | Disposition: A | Discharge status: Home / Self Care | Payer: Self-pay | Source: Ambulatory Visit | Attending: Obstetrics and Gynecology

## 2017-05-23 ENCOUNTER — Ambulatory Visit (HOSPITAL_COMMUNITY): Payer: Medicaid Other

## 2017-05-23 ENCOUNTER — Ambulatory Visit (HOSPITAL_COMMUNITY)
Admission: RE | Admit: 2017-05-23 | Discharge: 2017-05-23 | Disposition: A | Discharge status: Home / Self Care | Payer: Medicaid Other | Source: Ambulatory Visit | Attending: Obstetrics and Gynecology | Admitting: Obstetrics and Gynecology

## 2017-05-23 ENCOUNTER — Other Ambulatory Visit (HOSPITAL_COMMUNITY): Payer: Medicaid Other

## 2017-05-23 ENCOUNTER — Ambulatory Visit (HOSPITAL_COMMUNITY): Payer: Medicaid Other | Admitting: Registered Nurse

## 2017-05-23 ENCOUNTER — Encounter (HOSPITAL_COMMUNITY): Payer: Self-pay | Admitting: Registered Nurse

## 2017-05-23 ENCOUNTER — Other Ambulatory Visit: Payer: Self-pay

## 2017-05-23 DIAGNOSIS — Z9889 Other specified postprocedural states: Secondary | ICD-10-CM

## 2017-05-23 DIAGNOSIS — O021 Missed abortion: Secondary | ICD-10-CM | POA: Insufficient documentation

## 2017-05-23 DIAGNOSIS — Z3A01 Less than 8 weeks gestation of pregnancy: Secondary | ICD-10-CM | POA: Diagnosis not present

## 2017-05-23 DIAGNOSIS — O30101 Triplet pregnancy, unspecified number of placenta and unspecified number of amniotic sacs, first trimester: Secondary | ICD-10-CM

## 2017-05-23 HISTORY — DX: Other seasonal allergic rhinitis: J30.2

## 2017-05-23 HISTORY — PX: DILATION AND EVACUATION: SHX1459

## 2017-05-23 HISTORY — DX: Herpesviral infection, unspecified: B00.9

## 2017-05-23 LAB — CBC
HEMATOCRIT: 37.7 % (ref 36.0–46.0)
Hemoglobin: 13.2 g/dL (ref 12.0–15.0)
MCH: 31.1 pg (ref 26.0–34.0)
MCHC: 35 g/dL (ref 30.0–36.0)
MCV: 88.9 fL (ref 78.0–100.0)
Platelets: 400 10*3/uL (ref 150–400)
RBC: 4.24 MIL/uL (ref 3.87–5.11)
RDW: 13.3 % (ref 11.5–15.5)
WBC: 7.6 10*3/uL (ref 4.0–10.5)

## 2017-05-23 SURGERY — DILATION AND EVACUATION, UTERUS
Anesthesia: General | Site: Vagina

## 2017-05-23 MED ORDER — LIDOCAINE HCL (CARDIAC) PF 100 MG/5ML IV SOSY
PREFILLED_SYRINGE | INTRAVENOUS | Status: AC
  Filled 2017-05-23: qty 5

## 2017-05-23 MED ORDER — FENTANYL CITRATE (PF) 100 MCG/2ML IJ SOLN: 25.0000 ug | INTRAMUSCULAR | Status: DC | PRN

## 2017-05-23 MED ORDER — FENTANYL CITRATE (PF) 100 MCG/2ML IJ SOLN
INTRAMUSCULAR | Status: AC
  Filled 2017-05-23: qty 2

## 2017-05-23 MED ORDER — ONDANSETRON HCL 4 MG/2ML IJ SOLN
INTRAMUSCULAR | Status: DC | PRN
  Administered 2017-05-23: 4 mg via INTRAVENOUS

## 2017-05-23 MED ORDER — SILVER NITRATE-POT NITRATE 75-25 % EX MISC
CUTANEOUS | Status: DC | PRN
  Administered 2017-05-23: 2

## 2017-05-23 MED ORDER — DOXYCYCLINE HYCLATE 100 MG IV SOLR
200.0000 mg | INTRAVENOUS | Status: AC
  Administered 2017-05-23: 200 mg via INTRAVENOUS
  Filled 2017-05-23: qty 200

## 2017-05-23 MED ORDER — KETOROLAC TROMETHAMINE 30 MG/ML IJ SOLN
INTRAMUSCULAR | Status: AC
Start: 2017-05-23 — End: 2017-05-23
  Filled 2017-05-23: qty 1

## 2017-05-23 MED ORDER — DEXAMETHASONE SODIUM PHOSPHATE 10 MG/ML IJ SOLN
INTRAMUSCULAR | Status: AC
  Filled 2017-05-23: qty 1

## 2017-05-23 MED ORDER — SCOPOLAMINE 1 MG/3DAYS TD PT72
MEDICATED_PATCH | TRANSDERMAL | Status: AC
  Filled 2017-05-23: qty 1

## 2017-05-23 MED ORDER — OXYCODONE-ACETAMINOPHEN 5-325 MG PO TABS: 1.0000 | ORAL_TABLET | Freq: Four times a day (QID) | ORAL | 0 refills | Status: DC | PRN

## 2017-05-23 MED ORDER — SCOPOLAMINE 1 MG/3DAYS TD PT72
1.0000 | MEDICATED_PATCH | Freq: Once | TRANSDERMAL | Status: DC
  Administered 2017-05-23: 1.5 mg via TRANSDERMAL

## 2017-05-23 MED ORDER — PROPOFOL 10 MG/ML IV BOLUS
INTRAVENOUS | Status: DC | PRN
  Administered 2017-05-23: 200 mg via INTRAVENOUS

## 2017-05-23 MED ORDER — MEPERIDINE HCL 25 MG/ML IJ SOLN: 6.2500 mg | INTRAMUSCULAR | Status: DC | PRN

## 2017-05-23 MED ORDER — METOCLOPRAMIDE HCL 5 MG/ML IJ SOLN: 10.0000 mg | Freq: Once | INTRAMUSCULAR | Status: DC | PRN

## 2017-05-23 MED ORDER — ONDANSETRON HCL 4 MG/2ML IJ SOLN
INTRAMUSCULAR | Status: AC
  Filled 2017-05-23: qty 2

## 2017-05-23 MED ORDER — DEXAMETHASONE SODIUM PHOSPHATE 10 MG/ML IJ SOLN
INTRAMUSCULAR | Status: DC | PRN
  Administered 2017-05-23: 10 mg via INTRAVENOUS

## 2017-05-23 MED ORDER — MIDAZOLAM HCL 5 MG/5ML IJ SOLN
INTRAMUSCULAR | Status: DC | PRN
  Administered 2017-05-23: 2 mg via INTRAVENOUS

## 2017-05-23 MED ORDER — KETOROLAC TROMETHAMINE 30 MG/ML IJ SOLN
INTRAMUSCULAR | Status: DC | PRN
  Administered 2017-05-23: 30 mg via INTRAVENOUS

## 2017-05-23 MED ORDER — LIDOCAINE HCL 1 % IJ SOLN
INTRAMUSCULAR | Status: DC | PRN
  Administered 2017-05-23: 20 mL

## 2017-05-23 MED ORDER — LIDOCAINE 2% (20 MG/ML) 5 ML SYRINGE
INTRAMUSCULAR | Status: DC | PRN
  Administered 2017-05-23: 100 mg via INTRAVENOUS

## 2017-05-23 MED ORDER — LIDOCAINE HCL 1 % IJ SOLN
INTRAMUSCULAR | Status: AC
  Filled 2017-05-23: qty 20

## 2017-05-23 MED ORDER — FENTANYL CITRATE (PF) 100 MCG/2ML IJ SOLN
INTRAMUSCULAR | Status: DC | PRN
  Administered 2017-05-23 (×4): 50 ug via INTRAVENOUS

## 2017-05-23 MED ORDER — LACTATED RINGERS IV SOLN
INTRAVENOUS | Status: DC
  Administered 2017-05-23: 08:00:00 via INTRAVENOUS

## 2017-05-23 MED ORDER — LACTATED RINGERS IV SOLN: INTRAVENOUS | Status: DC

## 2017-05-23 MED ORDER — PROPOFOL 10 MG/ML IV BOLUS
INTRAVENOUS | Status: AC
  Filled 2017-05-23: qty 20

## 2017-05-23 MED ORDER — MIDAZOLAM HCL 2 MG/2ML IJ SOLN
INTRAMUSCULAR | Status: AC
  Filled 2017-05-23: qty 2

## 2017-05-23 SURGICAL SUPPLY — 20 items
CATH ROBINSON RED A/P 16FR (CATHETERS) IMPLANT
DECANTER SPIKE VIAL GLASS SM (MISCELLANEOUS) ×3 IMPLANT
GLOVE BIOGEL PI IND STRL 7.0 (GLOVE) ×1 IMPLANT
GLOVE BIOGEL PI IND STRL 7.5 (GLOVE) ×1 IMPLANT
GLOVE BIOGEL PI INDICATOR 7.0 (GLOVE) ×2
GLOVE BIOGEL PI INDICATOR 7.5 (GLOVE) ×2
GLOVE SURG SS PI 7.0 STRL IVOR (GLOVE) ×3 IMPLANT
GOWN STRL REUS W/TWL LRG LVL3 (GOWN DISPOSABLE) ×3 IMPLANT
GOWN STRL REUS W/TWL XL LVL3 (GOWN DISPOSABLE) ×3 IMPLANT
KIT BERKELEY 1ST TRIMESTER 3/8 (MISCELLANEOUS) ×3 IMPLANT
NS IRRIG 1000ML POUR BTL (IV SOLUTION) ×3 IMPLANT
PACK VAGINAL MINOR WOMEN LF (CUSTOM PROCEDURE TRAY) ×3 IMPLANT
PAD OB MATERNITY 4.3X12.25 (PERSONAL CARE ITEMS) ×3 IMPLANT
PAD PREP 24X48 CUFFED NSTRL (MISCELLANEOUS) ×3 IMPLANT
SET BERKELEY SUCTION TUBING (SUCTIONS) ×3 IMPLANT
TOWEL OR 17X24 6PK STRL BLUE (TOWEL DISPOSABLE) ×6 IMPLANT
VACURETTE 10 RIGID CVD (CANNULA) IMPLANT
VACURETTE 7MM CVD STRL WRAP (CANNULA) IMPLANT
VACURETTE 8 RIGID CVD (CANNULA) ×2 IMPLANT
VACURETTE 9 RIGID CVD (CANNULA) IMPLANT

## 2017-05-23 NOTE — Anesthesia Preprocedure Evaluation (Signed)
Anesthesia Evaluation  Patient identified by MRN, date of birth, ID band Patient awake    Reviewed: Allergy & Precautions, NPO status , Patient's Chart, lab work & pertinent test results  Airway Mallampati: II  TM Distance: >3 FB Neck ROM: Full    Dental no notable dental hx.    Pulmonary neg pulmonary ROS,    Pulmonary exam normal breath sounds clear to auscultation       Cardiovascular negative cardio ROS Normal cardiovascular exam Rhythm:Regular Rate:Normal     Neuro/Psych negative neurological ROS  negative psych ROS   GI/Hepatic negative GI ROS, Neg liver ROS,   Endo/Other  negative endocrine ROS  Renal/GU negative Renal ROS  negative genitourinary   Musculoskeletal negative musculoskeletal ROS (+)   Abdominal   Peds negative pediatric ROS (+)  Hematology negative hematology ROS (+)   Anesthesia Other Findings   Reproductive/Obstetrics negative OB ROS                             Anesthesia Physical Anesthesia Plan  ASA: II  Anesthesia Plan: General   Post-op Pain Management:    Induction: Intravenous  PONV Risk Score and Plan: 3 and Ondansetron, Treatment may vary due to age or medical condition and Midazolam  Airway Management Planned: LMA  Additional Equipment:   Intra-op Plan:   Post-operative Plan: Extubation in OR  Informed Consent: I have reviewed the patients History and Physical, chart, labs and discussed the procedure including the risks, benefits and alternatives for the proposed anesthesia with the patient or authorized representative who has indicated his/her understanding and acceptance.   Dental advisory given  Plan Discussed with: CRNA  Anesthesia Plan Comments:         Anesthesia Quick Evaluation

## 2017-05-23 NOTE — H&P (Signed)
Obstetrics and Gynecology History and Physical   Surgery Date: 05/23/2017  [facility]  Primary Care Provider: Candelaria StagersGonfa, Taye T  Referring Provider: No ref. provider found  Chief Complaint: pre op H&P  History of Present Illness: Julia Potter is a 27 y.o. African-American [phone] (Patient's last menstrual period was 03/15/2017.), seen for the above chief complaint. Her past medical history is significant for BMI 40s, ?HTN, rh negative.   Patient seen in clinic yesterday for f/u u/s results and dx with missed ab for entire triplet pregnancy. Options d/w her and she is amenable with surgical management. No s/s of miscarriage, fevers, chills.   Review of Systems:  as noted in the History of Present Illness.  Past Medical History:  Past Medical History:  Diagnosis Date  . ALLERGIC RHINITIS   . Anxiety    no meds  . Chlamydia   . Depression    doing ok, in therapy - no meds  . Herpes   . HSV infection   . Missed abortion 2017   nio surgery required  . Pregnancy induced hypertension 2010   resolved after pregnancy  . Seasonal allergies   . SVD (spontaneous vaginal delivery) 2010   x 1    Past Surgical History:  Past Surgical History:  Procedure Laterality Date  . INTRAUTERINE DEVICE INSERTION  09/2010  . IUD REMOVAL      Past Obstetrical History:  OB History  Gravida Para Term Preterm AB Living  3 1 0 1 1 1   SAB TAB Ectopic Multiple Live Births  1 0 0 0 1    # Outcome Date GA Lbr Len/2nd Weight Sex Delivery Anes PTL Lv  3 Current           2 SAB           1 Preterm  2145w0d    Vag-Spont       Obstetric Comments  G1P0101 [facility]. Delivered a viable female at 34 weeks    Past Gynecological History: As per HPI.  Social History:  Social History   Socioeconomic History  . Marital status: Single    Spouse name: Not on file  . Number of children: Not on file  . Years of education: Not on file  . Highest  education level: Not on file  Occupational History  . Not on file  Social Needs  . Financial resource strain: Not on file  . Food insecurity:    Worry: Not on file    Inability: Not on file  . Transportation needs:    Medical: Not on file    Non-medical: Not on file  Tobacco Use  . Smoking status: Never Smoker  . Smokeless tobacco: Never Used  Substance and Sexual Activity  . Alcohol use: Yes    Comment: 2 per week but none with pregnancy   . Drug use: No  . Sexual activity: Yes    Birth control/protection: None    Comment: approx 9-[redacted] wks gestation  Lifestyle  . Physical activity:    Days per week: Not on file    Minutes per session: Not on file  . Stress: Not on file  Relationships  . Social connections:    Talks on phone: Not on file    Gets together: Not on file    Attends religious service: Not on file    Active member of club or organization: Not on file    Attends meetings of  clubs or organizations: Not on file    Relationship status: Not on file  . Intimate partner violence:    Fear of current or ex partner: Not on file    Emotionally abused: Not on file    Physically abused: Not on file    Forced sexual activity: Not on file  Other Topics Concern  . Not on file  Social History Narrative   Lives with mom, sister and brother   Has one daughter born in [address]   Wants to go to law school    Family History:  Family History  Problem Relation Age of Onset  . Diabetes Maternal Grandmother   . Asthma Daughter    Medications Allan I. Poth had no medications administered during this visit. Current Facility-Administered Medications  Medication Dose Route Frequency Provider Last Rate Last Dose  . doxycycline (VIBRAMYCIN) 200 mg in dextrose 5 % 250 mL IVPB  200 mg Intravenous STAT Anyanwu, Ugonna A, MD      . lactated ringers infusion   Intravenous Continuous Anyanwu, Jethro Bastos, MD 125 mL/hr at 05/23/17 0759    . scopolamine (TRANSDERM-SCOP) 1  MG/3DAYS 1.5 mg  1 patch Transdermal Once Phillips Grout, MD   1.5 mg at 05/23/17 0718  . scopolamine (TRANSDERM-SCOP) 1 MG/3DAYS             Allergies Patient has no known allergies.   Physical Exam:  BP 132/79   Pulse (!) 103   Temp 98.1 F (36.7 C) (Oral)   Resp 18   LMP 03/15/2017   SpO2 100%  There is no height or weight on file to calculate BMI. General appearance: Well nourished, well developed female in no acute distress.  Normal s1 and s2, no MRGs CTAB Abdomen: soft, nttp, nd  Laboratory: none  Radiology: CLINICAL DATA:  Patient with triplet pregnancy. Assess for viability.  EXAM: OBSTETRIC <14 WK ULTRASOUND TRIPLET  COMPARISON:  Pelvic ultrasound 04/25/2017; 05/01/2017; 05/10/2017.  FINDINGS: Number of IUPs:  3  TRIPLET 1  Yolk sac:  Visualized.  Embryo:  Visualized.  Cardiac Activity: Not Visualized.  Heart Rate:  0 bpm  CRL: 4.8 mm 6w 1d [place] EDC: 01/14/2018  TRIPLET 2  Yolk sac:  Visualized.  Embryo:  Visualized.  Cardiac Activity: Not Visualized.  Heart Rate: 0 bpm  CRL: 3.5 mm 6w 0d [place] EDC: 01/13/2018  TRIPLET 3  Yolk sac:  Visualized.  Embryo:  Visualized.  Cardiac Activity: Not Visualized.  Heart Rate: 0 bpm  CRL: 4.8 mm 6w 1d [place] EDC: 01/14/2018  Subchorionic hemorrhage:  None visualized.  Maternal uterus/adnexae: No adnexal masses identified. No free fluid in the pelvis.  IMPRESSION: Triplet IUP without fetal heartbeat identified. As this exam is greater than 11 days from the prior exam which demonstrated gestational sac and yolk sac, findings meet definitive criteria for failed pregnancy. This follows SRU consensus guidelines: Diagnostic Criteria for Nonviable Pregnancy Early in the First Trimester. [facility]acy Mis J Med [phone].  These results will be called to the ordering clinician or representative by the Radiologist Assistant, and communication documented in the PACS or zVision  Dashboard.   Electronically Signed   By: Annia Belt M.D.   On: 05/22/2017 09:51  Assessment: missed AB x 3. Pt stable  Plan:  1. Transient hypertension F/u with PCP.   2. Missed abortion Pt amenable to proceeding with surgical management. Can proceed when OR is ready   3. Rh Negative Rhogam given in clinic on 4/22  RTC 2-3wks  Cornelia Copa MD Attending [facility])

## 2017-05-23 NOTE — Discharge Instructions (Addendum)
 We will discuss your surgery once again in detail at your post-op visit in two to four weeks. If you haven't already done so, please call to make your appointment as soon as possible.  Dilation and Curettage or Vacuum Curettage, Care After These instructions give you information on caring for yourself after your procedure. Your doctor may also give you more specific instructions. Call your doctor if you have any problems or questions after your procedure. HOME CARE  Do not drive for 24 hours.  Wait 1 week before doing any activities that wear you out.  Do not stand for a long time.  Limit stair climbing to once or twice a day.  Rest often.  Continue with your usual diet.  Drink enough fluids to keep your pee (urine) clear or pale yellow.  If you have a hard time pooping (constipation), you may:  Take a medicine to help you go poop (laxative) as told by your doctor.  Eat more fruit and bran.  Drink more fluids.  Take showers, not baths, for as long as told by your doctor.  Do not swim or use a hot tub until your doctor says it is okay.  Have someone with you for 1day after the procedure.  Do not douche, use tampons, or have sex (intercourse) until seen by your doctor  Only take medicines as told by your doctor. Do not take aspirin. It can cause bleeding.  Keep all doctor visits. GET HELP IF:  You have cramps or pain not helped by medicine.  You have new pain in the belly (abdomen).  You have a bad smelling fluid coming from your vagina.  You have a rash.  You have problems with any medicine. GET HELP RIGHT AWAY IF:   You start to bleed more than a regular period.  You have a fever.  You have chest pain.  You have trouble breathing.  You feel dizzy or feel like passing out (fainting).  You pass out.  You have pain in the tops of your shoulders.  You have vaginal bleeding with or without clumps of blood (blood clots). MAKE SURE YOU:  Understand  these instructions.  Will watch your condition.  Will get help right away if you are not doing well or get worse. Document Released: 10/27/2007 Document Revised: 01/22/2013 Document Reviewed: 08/16/2012 ExitCare Patient Information 2015 [employer]. This information is not intended to replace advice given to you by your health care provider. Make sure you discuss any questions you have with your health care provider.    Post Anesthesia Home Care Instructions  Activity: Get plenty of rest for the remainder of the day. A responsible individual must stay with you for 24 hours following the procedure.  For the next 24 hours, DO NOT: -Drive a car -Operate machinery -Drink alcoholic beverages -Take any medication unless instructed by your physician -Make any legal decisions or sign important papers.  Meals: Start with liquid foods such as gelatin or soup. Progress to regular foods as tolerated. Avoid greasy, spicy, heavy foods. If nausea and/or vomiting occur, drink only clear liquids until the nausea and/or vomiting subsides. Call your physician if vomiting continues.  Special Instructions/Symptoms: Your throat may feel dry or sore from the anesthesia or the breathing tube placed in your throat during surgery. If this causes discomfort, gargle with warm salt water. The discomfort should disappear within 24 hours.  If you had a scopolamine patch placed behind your ear for the management of post- operative   nausea and/or vomiting:  1. The medication in the patch is effective for 72 hours, after which it should be removed.  Wrap patch in a tissue and discard in the trash. Wash hands thoroughly with soap and water. 2. You may remove the patch earlier than 72 hours if you experience unpleasant side effects which may include dry mouth, dizziness or visual disturbances. 3. Avoid touching the patch. Wash your hands with soap and water after contact with the patch.      

## 2017-05-23 NOTE — Op Note (Signed)
Operative Note   05/23/2017  PRE-OP DIAGNOSIS: Missed abortion of triplets at 6wks by CRL with expected dates of 9wks   POST-OP DIAGNOSIS: Same  SURGEON: Surgeon(s) and Role:    Warren Bing* Graeson Nouri, MD - Primary  ASSISTANT: None  PROCEDURE:  Suction dilation and curettage via ultrasound guidance  ANESTHESIA: GETA and paracervical block  ESTIMATED BLOOD LOSS: 25mL  DRAINS: none  TOTAL IV FLUIDS: 600mL crystalloid  SPECIMENS: products of conception to pathology  VTE PROPHYLAXIS: SCDs to the bilateral lower extremities  ANTIBIOTICS: Doxycycline 100mg  IV x 1 pre op  COMPLICATIONS: none  DISPOSITION: PACU - hemodynamically stable.  CONDITION: stable  BLOOD TYPE: A NEG. Rhogam given:yes (in clinic on 05/22/2017)  FINDINGS: Exam under anesthesia revealed 10 week sized uterus with no masses and bilateral adnexa without masses or fullness. Ultrasound intra operatively confirmed findings from yesterday. Products of conception were seen, with gritty texture in all four quadrants. Postop ultrasound showed empty uterine cavity.   PROCEDURE IN DETAIL:  After informed consent was obtained, the patient was taken to the operating room where anesthesia was obtained without difficulty. The patient was positioned in the dorsal lithotomy position in [place] stirrups. The patient was examined under anesthesia, with the above noted findings.  The bi-valved speculum was placed inside the patient's vagina, and the the anterior lip of the cervix was seen and grasped with the tenaculum.  A paracervical block was achieved with 20mL of 1% lidocaine and then the cervix was progressively dilated to a 25 French-Pratt dilator.  The suction was then calibrated to 60mmHg and connected to the number 8 cannula, which was then introduced with the above noted findings. A gentle curettage was done at the end and yield no products of conception.   The suction was then done one more time to remove any remaining curettage  material and ultrasound was confirmatory.   Excellent hemostasis was noted, and all instruments were removed, with excellent hemostasis noted throughout.  She was then taken out of dorsal lithotomy. The patient tolerated the procedure well.  Sponge, lap and instrument counts were correct x2.  The patient was taken to recovery room in excellent condition.  Cornelia Copaharlie Daphane Odekirk, Jr MD Attending [facility]

## 2017-05-23 NOTE — Anesthesia Procedure Notes (Signed)
Procedure Name: LMA Insertion Date/Time: 05/23/2017 8:34 AM Performed by: Jhonnie GarnerMarshall, Georgena Weisheit M, CRNA Pre-anesthesia Checklist: Patient identified, Emergency Drugs available, Suction available and Patient being monitored Patient Re-evaluated:Patient Re-evaluated prior to induction Oxygen Delivery Method: Circle system utilized Preoxygenation: Pre-oxygenation with 100% oxygen Induction Type: IV induction Ventilation: Mask ventilation without difficulty LMA: LMA inserted LMA Size: 4.0 Number of attempts: 1 Placement Confirmation: positive ETCO2 and breath sounds checked- equal and bilateral Tube secured with: Tape Dental Injury: Teeth and Oropharynx as per pre-operative assessment

## 2017-05-23 NOTE — Transfer of Care (Signed)
Immediate Anesthesia Transfer of Care Note  Patient: Julia Potter  Procedure(s) Performed: DILATATION AND EVACUATION (N/A Vagina )  Patient Location: PACU  Anesthesia Type:General  Level of Consciousness: sedated  Airway & Oxygen Therapy: Patient Spontanous Breathing and Patient connected to nasal cannula oxygen  Post-op Assessment: Report given to RN and Post -op Vital signs reviewed and stable  Post vital signs: Reviewed and stable  Last Vitals:  Vitals Value Taken Time  BP    Temp    Pulse    Resp    SpO2      Last Pain:  Vitals:   05/23/17 0734  TempSrc: Oral  PainSc: 0-No pain      Patients Stated Pain Goal: 4 (05/23/17 0734)  Complications: No apparent anesthesia complications

## 2017-05-23 NOTE — Anesthesia Postprocedure Evaluation (Signed)
Anesthesia Post Note  Patient: Julia Potter  Procedure(s) Performed: DILATATION AND EVACUATION (N/A Vagina )     Patient location during evaluation: PACU Anesthesia Type: General Level of consciousness: awake and alert Pain management: pain level controlled Vital Signs Assessment: post-procedure vital signs reviewed and stable Respiratory status: spontaneous breathing, nonlabored ventilation, respiratory function stable and patient connected to nasal cannula oxygen Cardiovascular status: blood pressure returned to baseline and stable Postop Assessment: no apparent nausea or vomiting Anesthetic complications: no    Last Vitals:  Vitals:   05/23/17 0930 05/23/17 0945  BP: 109/73 121/81  Pulse: (!) 101 99  Resp: (!) 23 20  Temp:    SpO2: 99% 96%    Last Pain:  Vitals:   05/23/17 0930  TempSrc:   PainSc: 0-No pain   Pain Goal: Patients Stated Pain Goal: 4 (05/23/17 0734)               Phillips Groutarignan, Mckenzy Salazar

## 2017-05-24 ENCOUNTER — Encounter (HOSPITAL_COMMUNITY): Payer: Self-pay | Admitting: Obstetrics and Gynecology

## 2017-05-27 LAB — TYPE AND SCREEN
ABO/RH(D): A NEG
ANTIBODY SCREEN: POSITIVE
Unit division: 0
Unit division: 0

## 2017-05-27 LAB — BPAM RBC
BLOOD PRODUCT EXPIRATION DATE: 201905272359
Blood Product Expiration Date: 201905272359
UNIT TYPE AND RH: 600
Unit Type and Rh: 600

## 2017-05-29 ENCOUNTER — Encounter: Payer: Medicaid Other | Admitting: Obstetrics & Gynecology

## 2017-06-12 ENCOUNTER — Ambulatory Visit (INDEPENDENT_AMBULATORY_CARE_PROVIDER_SITE_OTHER): Payer: Medicaid Other | Admitting: Obstetrics and Gynecology

## 2017-06-12 ENCOUNTER — Encounter: Payer: Self-pay | Admitting: Obstetrics and Gynecology

## 2017-06-12 VITALS — BP 138/91 | HR 85 | Wt 226.8 lb

## 2017-06-12 DIAGNOSIS — R030 Elevated blood-pressure reading, without diagnosis of hypertension: Secondary | ICD-10-CM

## 2017-06-12 DIAGNOSIS — Z9889 Other specified postprocedural states: Secondary | ICD-10-CM

## 2017-06-12 DIAGNOSIS — N939 Abnormal uterine and vaginal bleeding, unspecified: Secondary | ICD-10-CM

## 2017-06-12 DIAGNOSIS — Z6841 Body Mass Index (BMI) 40.0 and over, adult: Secondary | ICD-10-CM

## 2017-06-12 MED ORDER — NORETHINDRONE 0.35 MG PO TABS: 1.0000 | ORAL_TABLET | Freq: Every day | ORAL | 3 refills | Status: DC

## 2017-06-12 NOTE — Progress Notes (Signed)
Obstetrics and Gynecology Visit Return Patient Evaluation  Appointment Date: 06/12/2017  Primary Care Provider: Candelaria Stagers T  Referring Provider: Almon Hercules, MD  Chief Complaint: post op follow up   History of Present Illness:  Julia Potter is a 27 y.o. with the above CC. She is s/p 4/23 suction d&c via u/s guidance for missed AB of triplets.   Pt states she's doing well and not having any issues or bleeding. Last had sex on Saturday  Review of Systems: as noted in the History of Present Illness.  Medications: None Allergies: has No Known Allergies.  Physical Exam:  BP (!) 138/91 (BP Location: Left Arm, Patient Position: Sitting, Cuff Size: Normal)   Pulse 85   Wt 226 lb 12.8 oz (102.9 kg)   LMP 03/15/2017   BMI 41.48 kg/m  Body mass index is 41.48 kg/m. General appearance: Well nourished, well developed female in no acute distress.  Neuro/Psych:  Normal mood and affect.     Assessment:  pt doing well  Plan:  1. Status post surgery Patient would like to wait approx 51m or so before trying again. Her BP is up and looking at the chart it looks like she has undiagnosed HTN. She goes to [facility] and was asked to call them this week for an annual to evaluate her for this. She states that she'd like to do OCPs but I told her that until her HTN is evaluated fully that I recommend no estrogen containing options. Progestin options d/w her and she'd like to do POPs. I told her about how to take them and their short window for taking. I told her to abstain for two weeks and if home UPT negative to start and to wait 5-7d before considering it effective  2. AUB Patient states she's had AUB ever since her IUD was removed in 2017 and she had the SAB.  Pap UTD and no current bleeding  RTC: PRN  Cornelia Copa MD Attending Center [facility])

## 2017-06-19 ENCOUNTER — Encounter (HOSPITAL_COMMUNITY): Payer: Self-pay

## 2017-06-19 ENCOUNTER — Inpatient Hospital Stay (HOSPITAL_COMMUNITY)
Admission: AD | Admit: 2017-06-19 | Discharge: 2017-06-19 | Disposition: A | Discharge status: Home / Self Care | Payer: Medicaid Other | Source: Ambulatory Visit | Attending: Obstetrics & Gynecology | Admitting: Obstetrics & Gynecology

## 2017-06-19 DIAGNOSIS — F419 Anxiety disorder, unspecified: Secondary | ICD-10-CM | POA: Insufficient documentation

## 2017-06-19 DIAGNOSIS — R109 Unspecified abdominal pain: Secondary | ICD-10-CM | POA: Diagnosis not present

## 2017-06-19 DIAGNOSIS — K5900 Constipation, unspecified: Secondary | ICD-10-CM | POA: Diagnosis not present

## 2017-06-19 DIAGNOSIS — M549 Dorsalgia, unspecified: Secondary | ICD-10-CM | POA: Diagnosis present

## 2017-06-19 DIAGNOSIS — Z9889 Other specified postprocedural states: Secondary | ICD-10-CM | POA: Insufficient documentation

## 2017-06-19 DIAGNOSIS — J309 Allergic rhinitis, unspecified: Secondary | ICD-10-CM | POA: Diagnosis not present

## 2017-06-19 DIAGNOSIS — F329 Major depressive disorder, single episode, unspecified: Secondary | ICD-10-CM | POA: Diagnosis not present

## 2017-06-19 LAB — URINALYSIS, ROUTINE W REFLEX MICROSCOPIC
BILIRUBIN URINE: NEGATIVE
Glucose, UA: NEGATIVE mg/dL
Hgb urine dipstick: NEGATIVE
Ketones, ur: NEGATIVE mg/dL
Leukocytes, UA: NEGATIVE
NITRITE: NEGATIVE
PROTEIN: NEGATIVE mg/dL
SPECIFIC GRAVITY, URINE: 1.01 (ref 1.005–1.030)
pH: 7 (ref 5.0–8.0)

## 2017-06-19 LAB — WET PREP, GENITAL
CLUE CELLS WET PREP: NONE SEEN
Sperm: NONE SEEN
TRICH WET PREP: NONE SEEN
Yeast Wet Prep HPF POC: NONE SEEN

## 2017-06-19 LAB — POCT PREGNANCY, URINE: PREG TEST UR: NEGATIVE

## 2017-06-19 LAB — CBC
HEMATOCRIT: 39.3 % (ref 36.0–46.0)
HEMOGLOBIN: 13.4 g/dL (ref 12.0–15.0)
MCH: 31.1 pg (ref 26.0–34.0)
MCHC: 34.1 g/dL (ref 30.0–36.0)
MCV: 91.2 fL (ref 78.0–100.0)
Platelets: 404 10*3/uL — ABNORMAL HIGH (ref 150–400)
RBC: 4.31 MIL/uL (ref 3.87–5.11)
RDW: 13.6 % (ref 11.5–15.5)
WBC: 8.2 10*3/uL (ref 4.0–10.5)

## 2017-06-19 MED ORDER — KETOROLAC TROMETHAMINE 60 MG/2ML IM SOLN
60.0000 mg | Freq: Once | INTRAMUSCULAR | Status: AC
  Administered 2017-06-19: 60 mg via INTRAMUSCULAR
  Filled 2017-06-19: qty 2

## 2017-06-19 NOTE — MAU Note (Signed)
Pt reports lower abdominal pain that radiates into back and vagina. States she it started about 1 week ago. Pt has not taken anything for pain. States the pain is a constant, cramping feeling. Pt denies vaginal bleeding or discharge. Pt states she had a D&C 3 weeks ago.

## 2017-06-19 NOTE — MAU Provider Note (Signed)
History     CSN: [id number]  Arrival date and time: 06/19/17 0126   First Provider Initiated Contact with Patient 06/19/17 0206     Chief Complaint  Patient presents with  . Abdominal Pain  . Back Pain   HPI Julia Potter is a 27 y.o. [phone] non pregnant female who presents with lower abdominal and back pain. She reports cramping for the last week that got worse today. She rates the cramps a 5/10 and has not tried anything for the pain. She denies any bleeding or discharge. She is worried that she has an infection because she had a D&C 3 weeks ago. She reports unprotected intercourse since her procedure. She is unsure if she has had a period. She reports some constipation over the last 2 days. Denies any nausea or vomiting.  OB History    Gravida  3   Para  1   Term  0   Preterm  1   AB  2   Living  1     SAB  1   TAB  0   Ectopic  0   Multiple  0   Live Births  1        Obstetric Comments  G1P0101 Admitted to [facility] for PIH, IOL. Delivered a viable female at 34 weeks        Past Medical History:  Diagnosis Date  . ALLERGIC RHINITIS   . Anxiety    no meds  . Chlamydia   . Depression    doing ok, in therapy - no meds  . Herpes   . HSV infection   . Pregnancy induced hypertension 2010   resolved after pregnancy  . Rh negative state in antepartum period 05/10/2017   Rhophylac  . Seasonal allergies     Past Surgical History:  Procedure Laterality Date  . DILATION AND EVACUATION N/A 05/23/2017   Procedure: DILATATION AND EVACUATION;  Surgeon: Marmet Bing, MD;  Location: [facility];  Service: Gynecology;  Laterality: N/A;  needs [place]  . INTRAUTERINE DEVICE INSERTION  09/2010  . IUD REMOVAL      Family History  Problem Relation Age of Onset  . Diabetes Maternal Grandmother   . Asthma Daughter     Social History   Tobacco Use  . Smoking status: Never Smoker  . Smokeless tobacco: Never Used  Substance Use Topics  . Alcohol use: Yes   Comment: 2 per week but none with pregnancy   . Drug use: No    Allergies: No Known Allergies  Medications Prior to Admission  Medication Sig Dispense Refill Last Dose  . diphenhydrAMINE (BENADRYL) 25 MG tablet Take 25 mg by mouth daily as needed for allergies.   Past Month at Unknown time  . norethindrone (MICRONOR,CAMILA,ERRIN) 0.35 MG tablet Take 1 tablet (0.35 mg total) by mouth daily. Start in two weeks if a home pregnancy test is negative. 3 Package 3 Unknown at Unknown time  . oxyCODONE-acetaminophen (PERCOCET/ROXICET) 5-325 MG tablet Take 1-2 tablets by mouth every 6 (six) hours as needed. (Patient not taking: Reported on 06/12/2017) 3 tablet 0 Unknown at Unknown time  . promethazine (PHENERGAN) 25 MG tablet Take 25 mg by mouth every 6 (six) hours as needed for nausea or vomiting.   Unknown at Unknown time  . Simethicone (GAS-X PO) Take 1 tablet by mouth as needed.   Unknown at Unknown time    Review of Systems  Constitutional: Negative.  Negative for fatigue and fever.  HENT: Negative.   Respiratory: Negative.  Negative for shortness of breath.   Cardiovascular: Negative.  Negative for chest pain.  Gastrointestinal: Positive for abdominal pain. Negative for constipation, diarrhea, nausea and vomiting.  Genitourinary: Negative.  Negative for dysuria, vaginal bleeding and vaginal discharge.  Musculoskeletal: Positive for back pain.  Neurological: Negative.  Negative for dizziness and headaches.   Physical Exam   Blood pressure 128/78, pulse 94, temperature 98.3 F (36.8 C), temperature source Oral, resp. rate 18, height  (1.575 m), weight 226 lb (102.5 kg), SpO2 100 %, unknown if currently breastfeeding.  Physical Exam  Nursing note and vitals reviewed. Constitutional: She is oriented to person, place, and time. She appears well-developed and well-nourished. No distress.  HENT:  Head: Normocephalic.  Eyes: Pupils are equal, round, and reactive to light.  Cardiovascular:  Normal rate, regular rhythm and normal heart sounds.  Respiratory: Effort normal and breath sounds normal. No respiratory distress.  GI: Soft. Bowel sounds are normal. She exhibits no distension. There is no tenderness. There is no rebound and no guarding.  Neurological: She is alert and oriented to person, place, and time.  Skin: Skin is warm and dry.  Psychiatric: She has a normal mood and affect. Her behavior is normal. Judgment and thought content normal.    MAU Course  Procedures Results for orders placed or performed during the hospital encounter of 06/19/17 (from the past 24 hour(s))  Urinalysis, Routine w reflex microscopic     Status: None   Collection Time: 06/19/17  1:35 AM  Result Value Ref Range   Color, Urine YELLOW YELLOW   APPearance CLEAR CLEAR   Specific Gravity, Urine 1.010 1.005 - 1.030   pH 7.0 5.0 - 8.0   Glucose, UA NEGATIVE NEGATIVE mg/dL   Hgb urine dipstick NEGATIVE NEGATIVE   Bilirubin Urine NEGATIVE NEGATIVE   Ketones, ur NEGATIVE NEGATIVE mg/dL   Protein, ur NEGATIVE NEGATIVE mg/dL   Nitrite NEGATIVE NEGATIVE   Leukocytes, UA NEGATIVE NEGATIVE  Pregnancy, urine POC     Status: None   Collection Time: 06/19/17  1:50 AM  Result Value Ref Range   Preg Test, Ur NEGATIVE NEGATIVE  CBC     Status: Abnormal   Collection Time: 06/19/17  2:17 AM  Result Value Ref Range   WBC 8.2 4.0 - 10.5 K/uL   RBC 4.31 3.87 - 5.11 MIL/uL   Hemoglobin 13.4 12.0 - 15.0 g/dL   HCT 16.1 09.6 - 04.5 %   MCV 91.2 78.0 - 100.0 fL   MCH 31.1 26.0 - 34.0 pg   MCHC 34.1 30.0 - 36.0 g/dL   RDW 40.9 81.1 - 91.4 %   Platelets 404 (H) 150 - 400 K/uL  Wet prep, genital     Status: Abnormal   Collection Time: 06/19/17  2:38 AM  Result Value Ref Range   Yeast Wet Prep HPF POC NONE SEEN NONE SEEN   Trich, Wet Prep NONE SEEN NONE SEEN   Clue Cells Wet Prep HPF POC NONE SEEN NONE SEEN   WBC, Wet Prep HPF POC FEW (A) NONE SEEN   Sperm NONE SEEN    MDM UA, UPT CBC Wet prep and  gc/chlamydia Toradol  IM- patient reports moderate relief from pain  Assessment and Plan   1. Abdominal cramping   2. Constipation, unspecified constipation type    -Discharge home in stable condition -Encouraged patient to use ibuprofen for cramping. Constipation comfort measures reviewed -Patient advised [facility] for routine health needs -Patient may return to MAU as needed or if her condition were to change or worsen  Rolm Bookbinder CNM 06/19/2017, 2:06 AM

## 2017-06-19 NOTE — Discharge Instructions (Signed)
Constipation, Adult Constipation is when a person has fewer bowel movements in a week than normal, has difficulty having a bowel movement, or has stools that are dry, hard, or larger than normal. Constipation may be caused by an underlying condition. It may become worse with age if a person takes certain medicines and does not take in enough fluids. Follow these instructions at home: Eating and drinking   Eat foods that have a lot of fiber, such as fresh fruits and vegetables, whole grains, and beans.  Limit foods that are high in fat, low in fiber, or overly processed, such as french fries, hamburgers, cookies, candies, and soda.  Drink enough fluid to keep your urine clear or pale yellow. General instructions  Exercise regularly or as told by your health care provider.  Go to the restroom when you have the urge to go. Do not hold it in.  Take over-the-counter and prescription medicines only as told by your health care provider. These include any fiber supplements.  Practice pelvic floor retraining exercises, such as deep breathing while relaxing the lower abdomen and pelvic floor relaxation during bowel movements.  Watch your condition for any changes.  Keep all follow-up visits as told by your health care provider. This is important. Contact a health care provider if:  You have pain that gets worse.  You have a fever.  You do not have a bowel movement after 4 days.  You vomit.  You are not hungry.  You lose weight.  You are bleeding from the anus.  You have thin, pencil-like stools. Get help right away if:  You have a fever and your symptoms suddenly get worse.  You leak stool or have blood in your stool.  Your abdomen is bloated.  You have severe pain in your abdomen.  You feel dizzy or you faint. This information is not intended to replace advice given to you by your health care provider. Make sure you discuss any questions you have with your health care  provider. Document Released: 10/16/2003 Document Revised: 08/07/2015 Document Reviewed: 07/08/2015 Elsevier Interactive Patient Education  2018 Elsevier Inc. Abdominal Pain, Adult Abdominal pain can be caused by many things. Often, abdominal pain is not serious and it gets better with no treatment or by being treated at home. However, sometimes abdominal pain is serious. Your health care provider will do a medical history and a physical exam to try to determine the cause of your abdominal pain. Follow these instructions at home:  Take over-the-counter and prescription medicines only as told by your health care provider. Do not take a laxative unless told by your health care provider.  Drink enough fluid to keep your urine clear or pale yellow.  Watch your condition for any changes.  Keep all follow-up visits as told by your health care provider. This is important. Contact a health care provider if:  Your abdominal pain changes or gets worse.  You are not hungry or you lose weight without trying.  You are constipated or have diarrhea for more than 2-3 days.  You have pain when you urinate or have a bowel movement.  Your abdominal pain wakes you up at night.  Your pain gets worse with meals, after eating, or with certain foods.  You are throwing up and cannot keep anything down.  You have a fever. Get help right away if:  Your pain does not go away as soon as your health care provider told you to expect.  You cannot stop   throwing up.  Your pain is only in areas of the abdomen, such as the right side or the left lower portion of the abdomen.  You have bloody or black stools, or stools that look like tar.  You have severe pain, cramping, or bloating in your abdomen.  You have signs of dehydration, such as: ? Dark urine, very little urine, or no urine. ? Cracked lips. ? Dry mouth. ? Sunken eyes. ? Sleepiness. ? Weakness. This information is not intended to replace advice  given to you by your health care provider. Make sure you discuss any questions you have with your health care provider. Document Released: 10/27/2004 Document Revised: 08/07/2015 Document Reviewed: 07/01/2015 Elsevier Interactive Patient Education  2018 Elsevier Inc.  

## 2017-06-20 LAB — GC/CHLAMYDIA PROBE AMP (~~LOC~~) NOT AT ARMC
CHLAMYDIA, DNA PROBE: NEGATIVE
Neisseria Gonorrhea: NEGATIVE

## 2017-07-08 ENCOUNTER — Inpatient Hospital Stay (HOSPITAL_COMMUNITY)
Admission: AD | Admit: 2017-07-08 | Discharge: 2017-07-08 | Disposition: A | Discharge status: Home / Self Care | Payer: Medicaid Other | Source: Ambulatory Visit | Attending: Obstetrics & Gynecology | Admitting: Obstetrics & Gynecology

## 2017-07-08 ENCOUNTER — Other Ambulatory Visit: Payer: Self-pay

## 2017-07-08 ENCOUNTER — Encounter (HOSPITAL_COMMUNITY): Payer: Self-pay | Admitting: *Deleted

## 2017-07-08 ENCOUNTER — Inpatient Hospital Stay (HOSPITAL_COMMUNITY): Payer: Medicaid Other

## 2017-07-08 DIAGNOSIS — R102 Pelvic and perineal pain: Secondary | ICD-10-CM | POA: Diagnosis not present

## 2017-07-08 DIAGNOSIS — M545 Low back pain: Secondary | ICD-10-CM | POA: Insufficient documentation

## 2017-07-08 DIAGNOSIS — O034 Incomplete spontaneous abortion without complication: Secondary | ICD-10-CM | POA: Diagnosis not present

## 2017-07-08 LAB — URINALYSIS, ROUTINE W REFLEX MICROSCOPIC
Bilirubin Urine: NEGATIVE
Glucose, UA: NEGATIVE mg/dL
Hgb urine dipstick: NEGATIVE
Ketones, ur: NEGATIVE mg/dL
LEUKOCYTES UA: NEGATIVE
Nitrite: NEGATIVE
PROTEIN: NEGATIVE mg/dL
Specific Gravity, Urine: 1.014 (ref 1.005–1.030)
pH: 8 (ref 5.0–8.0)

## 2017-07-08 LAB — CBC WITH DIFFERENTIAL/PLATELET
BASOS ABS: 0 10*3/uL (ref 0.0–0.1)
Basophils Relative: 0 %
Eosinophils Absolute: 0.1 10*3/uL (ref 0.0–0.7)
Eosinophils Relative: 1 %
HCT: 38.6 % (ref 36.0–46.0)
HEMOGLOBIN: 13.1 g/dL (ref 12.0–15.0)
LYMPHS PCT: 42 %
Lymphs Abs: 4 10*3/uL (ref 0.7–4.0)
MCH: 30.4 pg (ref 26.0–34.0)
MCHC: 33.9 g/dL (ref 30.0–36.0)
MCV: 89.6 fL (ref 78.0–100.0)
Monocytes Absolute: 0.7 10*3/uL (ref 0.1–1.0)
Monocytes Relative: 8 %
NEUTROS ABS: 4.7 10*3/uL (ref 1.7–7.7)
NEUTROS PCT: 49 %
PLATELETS: 440 10*3/uL — AB (ref 150–400)
RBC: 4.31 MIL/uL (ref 3.87–5.11)
RDW: 13 % (ref 11.5–15.5)
WBC: 9.6 10*3/uL (ref 4.0–10.5)

## 2017-07-08 LAB — HCG, QUANTITATIVE, PREGNANCY: hCG, Beta Chain, Quant, S: 14 m[IU]/mL — ABNORMAL HIGH (ref <5)

## 2017-07-08 LAB — POCT PREGNANCY, URINE: PREG TEST UR: NEGATIVE

## 2017-07-08 MED ORDER — MISOPROSTOL 200 MCG PO TABS: 800.0000 ug | ORAL_TABLET | Freq: Once | ORAL | Status: DC

## 2017-07-08 MED ORDER — KETOROLAC TROMETHAMINE 60 MG/2ML IM SOLN
60.0000 mg | Freq: Once | INTRAMUSCULAR | Status: AC
  Administered 2017-07-08: 60 mg via INTRAMUSCULAR
  Filled 2017-07-08: qty 2

## 2017-07-08 MED ORDER — OXYCODONE-ACETAMINOPHEN 5-325 MG PO TABS: 1.0000 | ORAL_TABLET | ORAL | 0 refills | Status: DC | PRN

## 2017-07-08 NOTE — MAU Provider Note (Signed)
History     CSN: [id number]  Arrival date and time: 07/08/17 1656   First Provider Initiated Contact with Patient 07/08/17 1758      Chief Complaint  Patient presents with  . Abdominal Pain  . Back Pain   HPI  Ms. Julia Potter is a 27 y.o. [phone] [id number] who presents to MAU today with complaint of pelvic pain and low back pain for the last few weeks. She had a MAB at ~ 6 weeks with triplets that required D&C on 05/23/17. She initially had minimal pain and bleeding following the procedure. She returned to MAU on 06/19/17 with increased pain similar to today. She had a negative pregnancy test at that time and serum hCG was not performed. She states pain has remained and is worsening. She denies vaginal bleeding, discharge or fever. She has taken Tylenol without relief. She has had nausea and one episode of vomiting this morning. She denies diarrhea or constipation. She has had unprotected sex on a few occasions since the surgery most recently 2 days ago.   OB History    Gravida  3   Para  1   Term  0   Preterm  1   AB  2   Living  1     SAB  1   TAB  0   Ectopic  0   Multiple  0   Live Births  1        Obstetric Comments  G1P0101 [facility]. Delivered a viable female at 34 weeks        Past Medical History:  Diagnosis Date  . ALLERGIC RHINITIS   . Anxiety    no meds  . Chlamydia   . Depression    doing ok, in therapy - no meds  . Herpes   . HSV infection   . Pregnancy induced hypertension 2010   resolved after pregnancy  . Rh negative state in antepartum period 05/10/2017   [ ] Rhophylac  . Seasonal allergies     Past Surgical History:  Procedure Laterality Date  . DILATION AND EVACUATION N/A 05/23/2017   Procedure: DILATATION AND EVACUATION;  Surgeon: Speed BingPickens, Charlie, MD;  Location: [facility]S;  Service: Gynecology;  Laterality: N/A;  needs US  . INTRAUTERINE DEVICE INSERTION  09/2010  . IUD REMOVAL      Family History  Problem Relation  Age of Onset  . Diabetes Maternal Grandmother   . Asthma Daughter     Social History   Tobacco Use  . Smoking status: Never Smoker  . Smokeless tobacco: Never Used  Substance Use Topics  . Alcohol use: Not Currently  . Drug use: No    Allergies: No Known Allergies  No medications prior to admission.    Review of Systems  Constitutional: Negative for fever.  Gastrointestinal: Positive for abdominal pain, nausea and vomiting. Negative for constipation and diarrhea.  Genitourinary: Negative for dysuria, frequency, urgency, vaginal bleeding and vaginal discharge.  Musculoskeletal: Positive for back pain.   Physical Exam   Blood pressure 134/87, pulse 86, resp. rate 18, height 5\' 2"  (1.575 m), weight 229 lb 8 oz (104.1 kg), SpO2 99 %, unknown if currently breastfeeding.  Physical Exam  Nursing note and vitals reviewed. Constitutional: She is oriented to person, place, and time. She appears well-developed and well-nourished. No distress.  HENT:  Head: Normocephalic and atraumatic.  Cardiovascular: Normal rate.  Respiratory: Effort normal.  GI: Soft. She exhibits no distension and no  mass. There is tenderness (mild). There is no rebound and no guarding.  Musculoskeletal:       Lumbar back: She exhibits no tenderness, no pain and no spasm.  Neurological: She is alert and oriented to person, place, and time.  Skin: Skin is warm and dry. No erythema.  Psychiatric: She has a normal mood and affect.    Results for orders placed or performed during the hospital encounter of 07/08/17 (from the past 24 hour(s))  Urinalysis, Routine w reflex microscopic     Status: Abnormal   Collection Time: 07/08/17  5:00 PM  Result Value Ref Range   Color, Urine STRAW (A) YELLOW   APPearance HAZY (A) CLEAR   Specific Gravity, Urine 1.014 1.005 - 1.030   pH 8.0 5.0 - 8.0   Glucose, UA NEGATIVE NEGATIVE mg/dL   Hgb urine dipstick NEGATIVE NEGATIVE   Bilirubin Urine NEGATIVE NEGATIVE    Ketones, ur NEGATIVE NEGATIVE mg/dL   Protein, ur NEGATIVE NEGATIVE mg/dL   Nitrite NEGATIVE NEGATIVE   Leukocytes, UA NEGATIVE NEGATIVE  Pregnancy, urine POC     Status: None   Collection Time: 07/08/17  5:09 PM  Result Value Ref Range   Preg Test, Ur NEGATIVE NEGATIVE  CBC with Differential/Platelet     Status: Abnormal   Collection Time: 07/08/17  5:49 PM  Result Value Ref Range   WBC 9.6 4.0 - 10.5 K/uL   RBC 4.31 3.87 - 5.11 MIL/uL   Hemoglobin 13.1 12.0 - 15.0 g/dL   HCT 95.6 21.3 - 08.6 %   MCV 89.6 78.0 - 100.0 fL   MCH 30.4 26.0 - 34.0 pg   MCHC 33.9 30.0 - 36.0 g/dL   RDW 57.8 46.9 - 62.9 %   Platelets 440 (H) 150 - 400 K/uL   Neutrophils Relative % 49 %   Neutro Abs 4.7 1.7 - 7.7 K/uL   Lymphocytes Relative 42 %   Lymphs Abs 4.0 0.7 - 4.0 K/uL   Monocytes Relative 8 %   Monocytes Absolute 0.7 0.1 - 1.0 K/uL   Eosinophils Relative 1 %   Eosinophils Absolute 0.1 0.0 - 0.7 K/uL   Basophils Relative 0 %   Basophils Absolute 0.0 0.0 - 0.1 K/uL  hCG, quantitative, pregnancy     Status: Abnormal   Collection Time: 07/08/17  6:12 PM  Result Value Ref Range   hCG, Beta Chain, Quant, S 14 (H) <5 mIU/mL   US Pelvis Transvanginal Non-ob (tv Only)  Result Date: 07/08/2017 CLINICAL DATA:  Spontaneous abortion 05/22/2017 with D & E 05/23/17. The patient now presents with intermittent low abdominal pain for 5 weeks and a quantitative beta HCG of 14. EXAM: ULTRASOUND PELVIS TRANSVAGINAL TECHNIQUE: Transvaginal ultrasound examination of the pelvis was performed including evaluation of the uterus, ovaries, adnexal regions, and pelvic cul-de-sac. COMPARISON:  Pelvic ultrasound 05/23/2017 FINDINGS: Uterus Measurements: 7.5 x 3.4 x 4.2 cm, within normal limits. No fibroids or other mass visualized. Endometrium Thickness: 15 mm, enlarged.  Increased vascularity is noted. Right ovary Measurements: 3.7 x 3.5 x 2.0 cm. An irregular hypoechoic region measures up to 1.9 cm. Left ovary Measurements:  3.2 x 2.2 x 1.7 cm. Normal appearance/no adnexal mass. Other findings:  A small amount of free fluid is present. IMPRESSION: 1. Endometrial thickening of up to 1.5 cm with increased vascularity in the setting of a positive quantitative beta HCG is concerning for retained products of conception. 2. Hypoechoic lesion measuring up to 1.9 cm in the right ovary is  nonspecific. This is likely physiologic. Electronically Signed   By: Marin Roberts M.D.   On: 07/08/2017 20:31    MAU Course  Procedures None  MDM UPT - negative UA, CBC and hCG drawn today HCG 14 today, unclear if this is from recent miscarriage or new pregnancy. At this low level more likely from recent pregnancy.  Discussed patient with Dr. Debroah Loop. Recommends [place] to determine if OB/GYN source of pain.  Discussed [place] results with Dr. Debroah Loop. Since patient has had unprotected sex since surgery we will need to wait until we repeat the hCG on Monday to determine if she is a candidate for Cytotec for possible retained products. If hCG is dropping we can consider Cytotec for retained products on Monday.  Assessment and Plan  A: Pelvic pain Recent missed AB, s/p D&C  P: Discharge home Rx for Percocet given to patient  Warning signs for worsening condition discussed Patient advised to follow-up with [facility] at 2pm on Monday for repeat STAT hCG or return to MAU as needed or if her condition were to change or worsen   Vonzella Nipple, PA-C 07/08/2017, 9:21 PM

## 2017-07-08 NOTE — Discharge Instructions (Signed)
Dilation and Curettage or Vacuum Curettage, Care After  These instructions give you information about caring for yourself after your procedure. Your doctor may also give you more specific instructions. Call your doctor if you have any problems or questions after your procedure.  Follow these instructions at home:  Activity   · Do not drive or use heavy machinery while taking prescription pain medicine.  · For 24 hours after your procedure, avoid driving.  · Take short walks often, followed by rest periods. Ask your doctor what activities are safe for you. After one or two days, you may be able to return to your normal activities.  · Do not lift anything that is heavier than 10 lb (4.5 kg) until your doctor approves.  · For at least 2 weeks, or as long as told by your doctor:  ? Do not douche.  ? Do not use tampons.  ? Do not have sex.  General instructions   · Take over-the-counter and prescription medicines only as told by your doctor. This is very important if you take blood thinning medicine.  · Do not take baths, swim, or use a hot tub until your doctor approves. Take showers instead of baths.  · Wear compression stockings as told by your doctor.  · It is up to you to get the results of your procedure. Ask your doctor when your results will be ready.  · Keep all follow-up visits as told by your doctor. This is important.  Contact a doctor if:  · You have very bad cramps that get worse or do not get better with medicine.  · You have very bad pain in your belly (abdomen).  · You cannot drink fluids without throwing up (vomiting).  · You get pain in a different part of the area between your belly and thighs (pelvis).  · You have bad-smelling discharge from your vagina.  · You have a rash.  Get help right away if:  · You are bleeding a lot from your vagina. A lot of bleeding means soaking more than one sanitary pad in an hour, for 2 hours in a row.  · You have clumps of blood (blood clots) coming from your  vagina.  · You have a fever or chills.  · Your belly feels very tender or hard.  · You have chest pain.  · You have trouble breathing.  · You cough up blood.  · You feel dizzy.  · You feel light-headed.  · You pass out (faint).  · You have pain in your neck or shoulder area.  Summary  · Take short walks often, followed by rest periods. Ask your doctor what activities are safe for you. After one or two days, you may be able to return to your normal activities.  · Do not lift anything that is heavier than 10 lb (4.5 kg) until your doctor approves.  · Do not take baths, swim, or use a hot tub until your doctor approves. Take showers instead of baths.  · Contact your doctor if you have any symptoms of infection, like bad-smelling discharge from your vagina.  This information is not intended to replace advice given to you by your health care provider. Make sure you discuss any questions you have with your health care provider.  Document Released: 10/27/2007 Document Revised: 10/05/2015 Document Reviewed: 10/05/2015  Elsevier Interactive Patient Education © 2017 Elsevier Inc.

## 2017-07-08 NOTE — MAU Note (Signed)
Pt presents witn c/o lower abdominal & back pain that began 5 weeks ago.  States was seen in MAU 3 weeks ago for same pain, was told probable constipation.  Reports having regular BM's. Reports had miscarriage in April 2018, s/p D&C.

## 2017-07-10 ENCOUNTER — Ambulatory Visit (INDEPENDENT_AMBULATORY_CARE_PROVIDER_SITE_OTHER): Payer: Medicaid Other | Admitting: Obstetrics and Gynecology

## 2017-07-10 ENCOUNTER — Encounter: Payer: Self-pay | Admitting: Obstetrics and Gynecology

## 2017-07-10 VITALS — BP 120/81 | HR 83 | Wt 229.4 lb

## 2017-07-10 DIAGNOSIS — O034 Incomplete spontaneous abortion without complication: Secondary | ICD-10-CM | POA: Diagnosis not present

## 2017-07-10 DIAGNOSIS — O283 Abnormal ultrasonic finding on antenatal screening of mother: Secondary | ICD-10-CM | POA: Diagnosis not present

## 2017-07-10 DIAGNOSIS — O3680X0 Pregnancy with inconclusive fetal viability, not applicable or unspecified: Secondary | ICD-10-CM

## 2017-07-10 LAB — HCG, QUANTITATIVE, PREGNANCY: hCG, Beta Chain, Quant, S: 13 m[IU]/mL — ABNORMAL HIGH (ref <5)

## 2017-07-10 NOTE — Progress Notes (Signed)
For repeat BHCG from 2 days ago. Having some back pain but denies abd pain and no bleeding. Once results back discussed with Dr Earlene Plateravis. Dr Earlene Plateravis brought pt in room to discuss plan of care.

## 2017-07-10 NOTE — Progress Notes (Signed)
GYNECOLOGY OFFICE FOLLOW UP NOTE  History:  27 y.o. Julia Potter here today for follow up for stat bHCG, s/p D&C for missed ab on 05/23/17 with negative urine pregnancy test after. Went to [facility]U on Saturday for back pain and found to have hcg of 14 with concern for retained POCs in uterus. Returns for stat BHCG today which is 13.   She continues to have back pain, unchanged. No cramping or other discomfort. She has been having unprotected intercourse.    Past Medical History:  Diagnosis Date  . ALLERGIC RHINITIS   . Anxiety    no meds  . Chlamydia   . Depression    doing ok, in therapy - no meds  . Herpes   . HSV infection   . Pregnancy induced hypertension 2010   resolved after pregnancy  . Rh negative state in antepartum period 05/10/2017   [ ] Rhophylac  . Seasonal allergies     Past Surgical History:  Procedure Laterality Date  . DILATION AND EVACUATION N/A 05/23/2017   Procedure: DILATATION AND EVACUATION;  Surgeon: Sardis Bing, MD;  Location: [facility];  Service: Gynecology;  Laterality: N/A;  needs [place]  . INTRAUTERINE DEVICE INSERTION  09/2010  . IUD REMOVAL       Current Outpatient Medications:  .  diphenhydrAMINE (BENADRYL) 25 MG tablet, Take 25 mg by mouth daily as needed for allergies., Disp: , Rfl:  .  oxyCODONE-acetaminophen (PERCOCET/ROXICET) 5-325 MG tablet, Take 1-2 tablets by mouth every 4 (four) hours as needed., Disp: 15 tablet, Rfl: 0 .  promethazine (PHENERGAN) 25 MG tablet, Take 25 mg by mouth every 6 (six) hours as needed for nausea or vomiting., Disp: , Rfl:  .  Simethicone (GAS-X PO), Take 1 tablet by mouth as needed., Disp: , Rfl:   The following portions of the patient's history were reviewed and updated as appropriate: allergies, current medications, past family history, past medical history, past social history, past surgical history and problem list.   Review of Systems:  Pertinent items noted in HPI and remainder of comprehensive ROS otherwise  negative.   Objective:  Physical Exam BP 120/81   Pulse 83   Wt 229 lb 6.4 oz (104.1 kg)   LMP  (LMP Unknown)   BMI 41.96 kg/m  CONSTITUTIONAL: Well-developed, well-nourished female in no acute distress.  HENT:  Normocephalic, atraumatic. External right and left ear normal. Oropharynx is clear and moist EYES: Conjunctivae and EOM are normal. Pupils are equal, round, and reactive to light. No scleral icterus.  NECK: Normal range of motion, supple, no masses SKIN: Skin is warm and dry. No rash noted. Not diaphoretic. No erythema. No pallor. NEUROLOGIC: Alert and oriented to person, place, and time. Normal reflexes, muscle tone coordination. No cranial nerve deficit noted. PSYCHIATRIC: Normal mood and affect. Normal behavior. Normal judgment and thought content. CARDIOVASCULAR: Normal heart rate noted RESPIRATORY: Effort normal, no problems with respiration noted ABDOMEN: Soft, no distention noted.   PELVIC: Deferred MUSCULOSKELETAL: Normal range of motion. No edema noted.  Labs and Imaging US Pelvis Transvanginal Non-ob (tv Only)  Result Date: 07/08/2017 CLINICAL DATA:  Spontaneous abortion 05/22/2017 with D & E 05/23/17. The patient now presents with intermittent low abdominal pain for 5 weeks and a quantitative beta HCG of 14. EXAM: ULTRASOUND PELVIS TRANSVAGINAL TECHNIQUE: Transvaginal ultrasound examination of the pelvis was performed including evaluation of the uterus, ovaries, adnexal regions, and pelvic cul-de-sac. COMPARISON:  Pelvic ultrasound 05/23/2017 FINDINGS: Uterus Measurements: 7.5 x 3.4 x 4.2 cm,  within normal limits. No fibroids or other mass visualized. Endometrium Thickness: 15 mm, enlarged.  Increased vascularity is noted. Right ovary Measurements: 3.7 x 3.5 x 2.0 cm. An irregular hypoechoic region measures up to 1.9 cm. Left ovary Measurements: 3.2 x 2.2 x 1.7 cm. Normal appearance/no adnexal mass. Other findings:  A small amount of free fluid is present. IMPRESSION: 1.  Endometrial thickening of up to 1.5 cm with increased vascularity in the setting of a positive quantitative beta HCG is concerning for retained products of conception. 2. Hypoechoic lesion measuring up to 1.9 cm in the right ovary is nonspecific. This is likely physiologic. Electronically Signed   By: Marin Robertshristopher  Mattern M.D.   On: 07/08/2017 20:31    Assessment & Plan:  1. Retained products of conception after miscarriage - hCG, quantitative, pregnancy - patient with possible retained products/thickened endometrial lining in the setting of low HCG s/p D&C for miscarriage vs new pregnancy of unknown location - most likely retained products of conception, however, given slight thickening in endometrium and h/o unprotected intercourse, reviewed option for redrawing HCG in 7 days to ensure that HCG is not increasing, she is agreeable - will have return for HCG 1 week, if stable then plan for cytotec   Routine preventative health maintenance measures emphasized. Please refer to After Visit Summary for other counseling recommendations.   Return in about 1 week (around 07/17/2017).   Baldemar LenisK. Meryl Leith Hedlund, M.D. Attending Obstetrician & Gynecologist, [facility], [facility]

## 2017-07-11 ENCOUNTER — Encounter: Payer: Self-pay | Admitting: Obstetrics and Gynecology

## 2017-07-11 ENCOUNTER — Other Ambulatory Visit: Payer: Self-pay | Admitting: General Practice

## 2017-07-11 ENCOUNTER — Telehealth: Payer: Self-pay | Admitting: General Practice

## 2017-07-11 DIAGNOSIS — O034 Incomplete spontaneous abortion without complication: Secondary | ICD-10-CM

## 2017-07-11 MED ORDER — MISOPROSTOL 200 MCG PO TABS
800.0000 ug | ORAL_TABLET | Freq: Once | ORAL | 0 refills | Status: DC
Start: 2017-07-11 — End: 2017-09-07

## 2017-07-11 NOTE — Telephone Encounter (Signed)
Patient called and left message on nurse voicemail line stating her medication she was prescribed isn't at the pharmacy. Obtained Rx from Vonzella NippleJulie Wenzel for cytotec and called Rx into pharmacy. Called & informed patient. Patient verbalized understanding & had no questions.

## 2017-07-17 ENCOUNTER — Ambulatory Visit: Payer: Medicaid Other

## 2017-07-17 DIAGNOSIS — O034 Incomplete spontaneous abortion without complication: Secondary | ICD-10-CM

## 2017-07-18 LAB — BETA HCG QUANT (REF LAB): HCG QUANT: 8 m[IU]/mL

## 2017-08-21 ENCOUNTER — Ambulatory Visit (HOSPITAL_COMMUNITY)
Admission: EM | Admit: 2017-08-21 | Discharge: 2017-08-21 | Disposition: A | Discharge status: Home / Self Care | Payer: Medicaid Other | Attending: Family Medicine | Admitting: Family Medicine

## 2017-08-21 ENCOUNTER — Encounter (HOSPITAL_COMMUNITY): Payer: Self-pay | Admitting: Emergency Medicine

## 2017-08-21 DIAGNOSIS — Z113 Encounter for screening for infections with a predominantly sexual mode of transmission: Secondary | ICD-10-CM

## 2017-08-21 DIAGNOSIS — Z3202 Encounter for pregnancy test, result negative: Secondary | ICD-10-CM

## 2017-08-21 DIAGNOSIS — R110 Nausea: Secondary | ICD-10-CM

## 2017-08-21 DIAGNOSIS — N898 Other specified noninflammatory disorders of vagina: Secondary | ICD-10-CM | POA: Insufficient documentation

## 2017-08-21 DIAGNOSIS — R109 Unspecified abdominal pain: Secondary | ICD-10-CM | POA: Insufficient documentation

## 2017-08-21 DIAGNOSIS — Z825 Family history of asthma and other chronic lower respiratory diseases: Secondary | ICD-10-CM | POA: Insufficient documentation

## 2017-08-21 LAB — POCT URINALYSIS DIP (DEVICE)
Bilirubin Urine: NEGATIVE
Glucose, UA: NEGATIVE mg/dL
Hgb urine dipstick: NEGATIVE
KETONES UR: NEGATIVE mg/dL
Nitrite: NEGATIVE
PROTEIN: NEGATIVE mg/dL
Specific Gravity, Urine: 1.015 (ref 1.005–1.030)
UROBILINOGEN UA: 0.2 mg/dL (ref 0.0–1.0)
pH: 7 (ref 5.0–8.0)

## 2017-08-21 LAB — POCT PREGNANCY, URINE: Preg Test, Ur: NEGATIVE

## 2017-08-21 MED ORDER — AZITHROMYCIN 250 MG PO TABS
1000.0000 mg | ORAL_TABLET | Freq: Once | ORAL | Status: AC
  Administered 2017-08-21: 1000 mg via ORAL

## 2017-08-21 MED ORDER — FLUCONAZOLE 150 MG PO TABS: 150.0000 mg | ORAL_TABLET | Freq: Once | ORAL | 0 refills | Status: AC

## 2017-08-21 MED ORDER — METRONIDAZOLE 500 MG PO TABS: 500.0000 mg | ORAL_TABLET | Freq: Two times a day (BID) | ORAL | 0 refills | Status: AC

## 2017-08-21 MED ORDER — CEFTRIAXONE SODIUM 250 MG IJ SOLR
250.0000 mg | Freq: Once | INTRAMUSCULAR | Status: AC
Start: 2017-08-21 — End: 2017-08-21
  Administered 2017-08-21: 250 mg via INTRAMUSCULAR

## 2017-08-21 MED ORDER — CEFTRIAXONE SODIUM 250 MG IJ SOLR
INTRAMUSCULAR | Status: AC
Start: 2017-08-21 — End: ?
  Filled 2017-08-21: qty 250

## 2017-08-21 MED ORDER — LIDOCAINE HCL (PF) 1 % IJ SOLN
INTRAMUSCULAR | Status: AC
  Filled 2017-08-21: qty 2

## 2017-08-21 MED ORDER — AZITHROMYCIN 250 MG PO TABS
ORAL_TABLET | ORAL | Status: AC
  Filled 2017-08-21: qty 4

## 2017-08-21 NOTE — Discharge Instructions (Signed)
We have treated you today for gonorrhea and chlamydia, with Rocephin and azithromycin. Please refrain from sexual intercourse for 7 days while medicines eliminating infection.  I have also sent in metronidazole for you to take twice daily for the next week beginning tomorrow, please do not drink alcohol while taking this.  May take 1 tablet of Diflucan to treat yeast, may repeat at end of course of metronidazole.  We are testing you for Gonorrhea, Chlamydia, Trichomonas, Yeast and Bacterial Vaginosis. We will call you if anything is positive and let you know if you require any further treatment. Please inform partners of any positive results.   Please return if symptoms not improving with treatment, development of fever, nausea, vomiting, abdominal pain.

## 2017-08-21 NOTE — ED Provider Notes (Signed)
[facility]    CSN: [id number] Arrival date & time: 08/21/17  1737     History   Chief Complaint Chief Complaint  Patient presents with  . Abdominal Cramping    HPI Julia Potter is a 27 y.o. female history of recent miscarriage presenting today for evaluation of abdominal cramping and vaginal discharge with odor.  Patient states that for the past 2 weeks she has had persistent abdominal cramping and a pressure sensation to her lower abdomen.  She has had some spotting off and on approximately 1 week ago, but bleeding has not been persistent.  She has noticed of milky white discharge that is an associated odor.  She notes that she had a miscarriage of triplets back in April and has not had a normal menstrual cycle since.  Patient has returned to being sexually active.  Patient does not have any known concerns for STDs, but is still concerned.  Denies associated itching or irritation.  HPI  Past Medical History:  Diagnosis Date  . ALLERGIC RHINITIS   . Anxiety    no meds  . Chlamydia   . Depression    doing ok, in therapy - no meds  . Herpes   . HSV infection   . Pregnancy induced hypertension 2010   resolved after pregnancy  . Rh negative state in antepartum period 05/10/2017   [ ] Rhophylac  . Seasonal allergies     Patient Active Problem List   Diagnosis Date Noted  . BMI 40.0-44.9, adult (HCC) 06/12/2017  . Transient hypertension 05/22/2017  . Abnormal uterine bleeding (AUB) 01/20/2016  . Hirsutism 09/14/2015  . Psychiatric illness 11/01/2013    Past Surgical History:  Procedure Laterality Date  . DILATION AND EVACUATION N/A 05/23/2017   Procedure: DILATATION AND EVACUATION;  Surgeon: [place] BingPickens, Charlie, MD;  Location: [facility];  Service: Gynecology;  Laterality: N/A;  needs US  . INTRAUTERINE DEVICE INSERTION  09/2010  . IUD REMOVAL      OB History    Gravida  3   Para  1   Term  0   Preterm  1   AB  2   Living  1     SAB  1   TAB  0     Ectopic  0   Multiple  0   Live Births  1        Obstetric Comments  G1P0101 [facility]. Delivered a viable female at 5534 weeks         Home Medications    Prior to Admission medications   Medication Sig Start Date End Date Taking? Authorizing Provider  diphenhydrAMINE (BENADRYL) 25 MG tablet Take 25 mg by mouth daily as needed for allergies.    [provider]  fluconazole (DIFLUCAN) 150 MG tablet Take 1 tablet (150 mg total) by mouth once for 1 dose. 08/21/17 08/21/17  Shanita Kanan C, PA-C  metroNIDAZOLE (FLAGYL) 500 MG tablet Take 1 tablet (500 mg total) by mouth 2 (two) times daily for 7 days. 08/21/17 08/28/17  Tyrelle Raczka C, PA-C  misoprostol (CYTOTEC) 200 MCG tablet Place 4 tablets (800 mcg total) vaginally once for 1 dose. 07/11/17 07/11/17  Marny LowensteinWenzel, Julie N, PA-C  oxyCODONE-acetaminophen (PERCOCET/ROXICET) 5-325 MG tablet Take 1-2 tablets by mouth every 4 (four) hours as needed. 07/08/17   Marny LowensteinWenzel, Julie N, PA-C  promethazine (PHENERGAN) 25 MG tablet Take 25 mg by mouth every 6 (six) hours as needed for nausea or vomiting.  [provider]  Simethicone (GAS-X PO) Take 1 tablet by mouth as needed.    [provider]  medroxyPROGESTERone (DEPO-PROVERA) 150 MG/ML injection Inject 150 mg into the muscle every 3 (three) months.    04/13/11  [provider]    Family History Family History  Problem Relation Age of Onset  . Diabetes Maternal Grandmother   . Asthma Daughter     Social History Social History   Tobacco Use  . Smoking status: Never Smoker  . Smokeless tobacco: Never Used  Substance Use Topics  . Alcohol use: Not Currently  . Drug use: No     Allergies   Patient has no known allergies.   Review of Systems Review of Systems  Constitutional: Negative for fever.  Respiratory: Negative for shortness of breath.   Cardiovascular: Negative for chest pain.  Gastrointestinal: Positive for abdominal  pain and nausea. Negative for diarrhea and vomiting.  Genitourinary: Positive for vaginal discharge. Negative for dysuria, flank pain, genital sores, hematuria, menstrual problem, vaginal bleeding and vaginal pain.  Musculoskeletal: Negative for back pain.  Skin: Negative for rash.  Neurological: Negative for dizziness, light-headedness and headaches.     Physical Exam Triage Vital Signs ED Triage Vitals  Enc Vitals Group     BP 08/21/17 1744 (!) 138/95     Pulse Rate 08/21/17 1744 90     Resp 08/21/17 1744 18     Temp 08/21/17 1744 98.2 F (36.8 C)     Temp Source 08/21/17 1744 Oral     SpO2 08/21/17 1744 100 %     Weight --      Height --      Head Circumference --      Peak Flow --      Pain Score 08/21/17 1745 5     Pain Loc --      Pain Edu? --      Excl. in GC? --    No data found.  Updated Vital Signs BP (!) 138/95 (BP Location: Right Arm)   Pulse 90   Temp 98.2 F (36.8 C) (Oral)   Resp 18   SpO2 100%   Visual Acuity Right Eye Distance:   Left Eye Distance:   Bilateral Distance:    Right Eye Near:   Left Eye Near:    Bilateral Near:     Physical Exam  Constitutional: She is oriented to person, place, and time. She appears well-developed and well-nourished.  No acute distress  HENT:  Head: Normocephalic and atraumatic.  Nose: Nose normal.  Eyes: Conjunctivae are normal.  Neck: Neck supple.  Cardiovascular: Normal rate.  Pulmonary/Chest: Effort normal. No respiratory distress.  Abdominal: She exhibits no distension. There is tenderness.  Mild tenderness to bilateral lower quadrants and suprapubic area, negative rebound, negative Rovsing, negative McBurney's.  Genitourinary:  Genitourinary Comments: Normal external female genitalia, white thick discharge seen in vaginal vault, no's cervical erythema  Musculoskeletal: Normal range of motion.  Neurological: She is alert and oriented to person, place, and time.  Skin: Skin is warm and dry.    Psychiatric: She has a normal mood and affect.  Nursing note and vitals reviewed.    UC Treatments / Results  Labs (all labs ordered are listed, but only abnormal results are displayed) Labs Reviewed  POCT URINALYSIS DIP (DEVICE) - Abnormal; Notable for the following components:      Result Value   Leukocytes, UA TRACE (*)    All other components within normal limits  POCT PREGNANCY, URINE  CERVICOVAGINAL ANCILLARY ONLY    EKG None  Radiology No results found.  Procedures Procedures (including critical care time)  Medications Ordered in UC Medications  cefTRIAXone (ROCEPHIN) injection 250 mg (250 mg Intramuscular Given 08/21/17 1823)  azithromycin (ZITHROMAX) tablet 1,000 mg (1,000 mg Oral Given 08/21/17 1823)    Initial Impression / Assessment and Plan / UC Course  I have reviewed the triage vital signs and the nursing notes.  Pertinent labs & imaging results that were available during my care of the patient were reviewed by me and considered in my medical decision making (see chart for details).     Based of discharge on exam, concerning for yeast versus BV.  Discussed empiric treatment with patient, she opted to go ahead and receive treatment so that she did not have to come back.  Will provide Rocephin and azithromycin.  Will provide Diflucan and metronidazole outpatient.  Vaginal swab obtained, will send off to confirm any positive results and alter treatment as needed.Discussed strict return precautions. Patient verbalized understanding and is agreeable with plan.  Final Clinical Impressions(s) / UC Diagnoses   Final diagnoses:  Abdominal cramping  Vaginal discharge     Discharge Instructions     We have treated you today for gonorrhea and chlamydia, with Rocephin and azithromycin. Please refrain from sexual intercourse for 7 days while medicines eliminating infection.  I have also sent in metronidazole for you to take twice daily for the next week beginning  tomorrow, please do not drink alcohol while taking this.  May take 1 tablet of Diflucan to treat yeast, may repeat at end of course of metronidazole.  We are testing you for Gonorrhea, Chlamydia, Trichomonas, Yeast and Bacterial Vaginosis. We will call you if anything is positive and let you know if you require any further treatment. Please inform partners of any positive results.   Please return if symptoms not improving with treatment, development of fever, nausea, vomiting, abdominal pain.     ED Prescriptions    Medication Sig Dispense Auth. Provider   metroNIDAZOLE (FLAGYL) 500 MG tablet Take 1 tablet (500 mg total) by mouth 2 (two) times daily for 7 days. 14 tablet Deangelo Berns C, PA-C   fluconazole (DIFLUCAN) 150 MG tablet Take 1 tablet (150 mg total) by mouth once for 1 dose. 2 tablet Offie Waide C, PA-C     Controlled Substance Prescriptions [place] Controlled Substance Registry consulted? Not Applicable   Lew Dawes, [place] 08/21/17 1834

## 2017-08-21 NOTE — ED Triage Notes (Signed)
PT reports abdominal cramping. Diarrhea last week.   Vaginal discharge with odor.

## 2017-08-22 LAB — CERVICOVAGINAL ANCILLARY ONLY
BACTERIAL VAGINITIS: POSITIVE — AB
CHLAMYDIA, DNA PROBE: NEGATIVE
Candida vaginitis: NEGATIVE
Neisseria Gonorrhea: POSITIVE — AB
TRICH (WINDOWPATH): NEGATIVE

## 2017-08-26 ENCOUNTER — Telehealth (HOSPITAL_COMMUNITY): Payer: Self-pay

## 2017-08-26 NOTE — Telephone Encounter (Signed)
Test for gonorrhea was positive. This was treated at the urgent care visit with IM rocephin 250mg  and po zithromax 1g. Need to educate patient to refrain from sexual intercourse for 7 days after treatment to give the medicine time to work. Sexual partners need to be notified and tested/treated. Condoms may reduce risk of reinfection. Recheck or followup with PCP for further evaluation if symptoms are not improving. [facility] notified.  Bacterial Vaginosis test is positive.  Prescription for metronidazole was given at the urgent care visit.   Attempted to reach patient. No answer at this time.

## 2017-08-26 NOTE — Telephone Encounter (Signed)
Pt is aware of results. 

## 2017-09-07 ENCOUNTER — Ambulatory Visit (HOSPITAL_COMMUNITY)
Admission: EM | Admit: 2017-09-07 | Discharge: 2017-09-07 | Disposition: A | Discharge status: Home / Self Care | Payer: Medicaid Other | Attending: Family Medicine | Admitting: Family Medicine

## 2017-09-07 ENCOUNTER — Encounter (HOSPITAL_COMMUNITY): Payer: Self-pay

## 2017-09-07 DIAGNOSIS — F419 Anxiety disorder, unspecified: Secondary | ICD-10-CM | POA: Insufficient documentation

## 2017-09-07 DIAGNOSIS — Z79899 Other long term (current) drug therapy: Secondary | ICD-10-CM | POA: Insufficient documentation

## 2017-09-07 DIAGNOSIS — N760 Acute vaginitis: Secondary | ICD-10-CM

## 2017-09-07 DIAGNOSIS — F329 Major depressive disorder, single episode, unspecified: Secondary | ICD-10-CM | POA: Insufficient documentation

## 2017-09-07 MED ORDER — METRONIDAZOLE 0.75 % VA GEL: 1.0000 | Freq: Every day | VAGINAL | 0 refills | Status: AC

## 2017-09-07 NOTE — ED Triage Notes (Signed)
Pt presents with vaginal discomfort following from a recent office visit

## 2017-09-07 NOTE — ED Provider Notes (Signed)
[facility]    CSN: [id number] Arrival date & time: 09/07/17  1718     History   Chief Complaint Chief Complaint  Patient presents with  . Vaginal Discomfort    HPI Julia Potter is a 27 y.o. female.   Julia Potter presents with complaints of vaginal itching which has been ongoing for the past few days. Has had creamy white vaginal discharge. Was seen and treated 7/22 for gonorrhea as well as BV. Was provided treatment for yeast at that time as well. Partner was treated for gonorrhea as well. No new partners. No odor to dc. No urinary symptoms. No stomach or back pain, no fevers. Negative for pregnancy 7/22. No bleeding. Miscarriage in April of this year.    ROS per HPI.      Past Medical History:  Diagnosis Date  . ALLERGIC RHINITIS   . Anxiety    no meds  . Chlamydia   . Depression    doing ok, in therapy - no meds  . Herpes   . HSV infection   . Pregnancy induced hypertension 2010   resolved after pregnancy  . Rh negative state in antepartum period 05/10/2017   [ ] Rhophylac  . Seasonal allergies     Patient Active Problem List   Diagnosis Date Noted  . BMI 40.0-44.9, adult (HCC) 06/12/2017  . Transient hypertension 05/22/2017  . Abnormal uterine bleeding (AUB) 01/20/2016  . Hirsutism 09/14/2015  . Psychiatric illness 11/01/2013    Past Surgical History:  Procedure Laterality Date  . DILATION AND EVACUATION N/A 05/23/2017   Procedure: DILATATION AND EVACUATION;  Surgeon: [place] Bing, MD;  Location: [facility];  Service: Gynecology;  Laterality: N/A;  needs [place]  . INTRAUTERINE DEVICE INSERTION  09/2010  . IUD REMOVAL      OB History    Gravida  3   Para  1   Term  0   Preterm  1   AB  2   Living  1     SAB  1   TAB  0   Ectopic  0   Multiple  0   Live Births  1        Obstetric Comments  G1P0101 Admitted to [facility] for PIH, IOL. Delivered a viable female at 17 weeks         Home Medications    Prior to Admission  medications   Medication Sig Start Date End Date Taking? Authorizing Provider  diphenhydrAMINE (BENADRYL) 25 MG tablet Take 25 mg by mouth daily as needed for allergies.    [provider]  metroNIDAZOLE (METROGEL VAGINAL) 0.75 % vaginal gel Place 1 Applicatorful vaginally at bedtime for 5 days. 09/07/17 09/12/17  Georgetta Haber, NP  promethazine (PHENERGAN) 25 MG tablet Take 25 mg by mouth every 6 (six) hours as needed for nausea or vomiting.    [provider]  Simethicone (GAS-X PO) Take 1 tablet by mouth as needed.    [provider]  medroxyPROGESTERone (DEPO-PROVERA) 150 MG/ML injection Inject 150 mg into the muscle every 3 (three) months.    04/13/11  [provider]    Family History Family History  Problem Relation Age of Onset  . Diabetes Maternal Grandmother   . Asthma Daughter     Social History Social History   Tobacco Use  . Smoking status: Never Smoker  . Smokeless tobacco: Never Used  Substance Use Topics  . Alcohol use: Not Currently  . Drug use: No  Allergies   Patient has no known allergies.   Review of Systems Review of Systems   Physical Exam Triage Vital Signs ED Triage Vitals  Enc Vitals Group     BP 09/07/17 1745 133/88     Pulse Rate 09/07/17 1745 98     Resp 09/07/17 1745 16     Temp 09/07/17 1745 99.1 F (37.3 C)     Temp Source 09/07/17 1745 Oral     SpO2 09/07/17 1745 100 %     Weight --      Height --      Head Circumference --      Peak Flow --      Pain Score 09/07/17 1801 0     Pain Loc --      Pain Edu? --      Excl. in GC? --    No data found.  Updated Vital Signs BP 133/88 (BP Location: Left Arm)   Pulse 98   Temp 99.1 F (37.3 C) (Oral)   Resp 16   LMP  (LMP Unknown)   SpO2 100%    Physical Exam  Constitutional: She is oriented to person, place, and time. She appears well-developed and well-nourished. No distress.  HENT:  Head: Normocephalic and atraumatic.  Eyes:  Conjunctivae are normal.  Neck: Neck supple.  Cardiovascular: Normal rate, regular rhythm and normal heart sounds.  No murmur heard. Pulmonary/Chest: Effort normal and breath sounds normal. No respiratory distress.  Abdominal: Soft. There is no tenderness.  Genitourinary:  Genitourinary Comments: Denies sores, lesions, bleeding; performed self swab; gu exam deferred  Musculoskeletal: She exhibits no edema.  Neurological: She is alert and oriented to person, place, and time.  Skin: Skin is warm and dry.  Psychiatric: She has a normal mood and affect.  Nursing note and vitals reviewed.    UC Treatments / Results  Labs (all labs ordered are listed, but only abnormal results are displayed) Labs Reviewed  CERVICOVAGINAL ANCILLARY ONLY    EKG None  Radiology No results found.  Procedures Procedures (including critical care time)  Medications Ordered in UC Medications - No data to display  Initial Impression / Assessment and Plan / UC Course  I have reviewed the triage vital signs and the nursing notes.  Pertinent labs & imaging results that were available during my care of the patient were reviewed by me and considered in my medical decision making (see chart for details).     Starting treatment for BV today as patient states that flagyl orally have historically not been helpful for her, filled for metrogel. Vaginal cytology repeated as question yeast at this time as well. Will notify of any positive findings and if any changes to treatment are needed.  If symptoms worsen or do not improve in the next week to return to be seen or to follow up with PCP.  Patient verbalized understanding and agreeable to plan.    Final Clinical Impressions(s) / UC Diagnoses   Final diagnoses:  Acute vaginitis     Discharge Instructions     We are retesting your today for yeast and bacteria.  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  If this  returns negative you may stop the treatment. If your test returns positive for yeast we will let you know and provide further medication.  Will notify you of any positive findings and if any changes to treatment are needed.  You may also  monitor your results on your MyChart. Please withhold from intercourse for the next week. Please use condoms to prevent STD's.   If symptoms worsen or do not improve in the next week to return to be seen or to follow up with your PCP.     ED Prescriptions    Medication Sig Dispense Auth. Provider   metroNIDAZOLE (METROGEL VAGINAL) 0.75 % vaginal gel Place 1 Applicatorful vaginally at bedtime for 5 days. 50 g Georgetta HaberBurky, Francisca Langenderfer B, NP     Controlled Substance Prescriptions [place] Controlled Substance Registry consulted? Not Applicable   Georgetta HaberBurky, Jaelon Gatley B, NP 09/07/17 [phone]

## 2017-09-07 NOTE — Discharge Instructions (Signed)
We are retesting your today for yeast and bacteria.  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  If this returns negative you may stop the treatment. If your test returns positive for yeast we will let you know and provide further medication.  Will notify you of any positive findings and if any changes to treatment are needed.  You may also monitor your results on your MyChart. Please withhold from intercourse for the next week. Please use condoms to prevent STD's.   If symptoms worsen or do not improve in the next week to return to be seen or to follow up with your PCP.

## 2017-09-08 LAB — CERVICOVAGINAL ANCILLARY ONLY
Bacterial vaginitis: NEGATIVE
CANDIDA VAGINITIS: NEGATIVE
Chlamydia: NEGATIVE
Neisseria Gonorrhea: NEGATIVE
Trichomonas: NEGATIVE

## 2017-09-23 ENCOUNTER — Inpatient Hospital Stay (HOSPITAL_COMMUNITY)
Admission: AD | Admit: 2017-09-23 | Discharge: 2017-09-23 | Disposition: A | Discharge status: Home / Self Care | Payer: Medicaid Other | Source: Ambulatory Visit | Attending: Obstetrics and Gynecology | Admitting: Obstetrics and Gynecology

## 2017-09-23 ENCOUNTER — Encounter (HOSPITAL_COMMUNITY): Payer: Self-pay | Admitting: *Deleted

## 2017-09-23 DIAGNOSIS — F419 Anxiety disorder, unspecified: Secondary | ICD-10-CM | POA: Insufficient documentation

## 2017-09-23 DIAGNOSIS — N898 Other specified noninflammatory disorders of vagina: Secondary | ICD-10-CM | POA: Insufficient documentation

## 2017-09-23 DIAGNOSIS — Z9889 Other specified postprocedural states: Secondary | ICD-10-CM | POA: Insufficient documentation

## 2017-09-23 DIAGNOSIS — R109 Unspecified abdominal pain: Secondary | ICD-10-CM | POA: Insufficient documentation

## 2017-09-23 DIAGNOSIS — F329 Major depressive disorder, single episode, unspecified: Secondary | ICD-10-CM | POA: Insufficient documentation

## 2017-09-23 DIAGNOSIS — Z825 Family history of asthma and other chronic lower respiratory diseases: Secondary | ICD-10-CM | POA: Insufficient documentation

## 2017-09-23 DIAGNOSIS — R1030 Lower abdominal pain, unspecified: Secondary | ICD-10-CM

## 2017-09-23 DIAGNOSIS — Z833 Family history of diabetes mellitus: Secondary | ICD-10-CM | POA: Insufficient documentation

## 2017-09-23 LAB — URINALYSIS, ROUTINE W REFLEX MICROSCOPIC
BILIRUBIN URINE: NEGATIVE
Bacteria, UA: NONE SEEN
Glucose, UA: NEGATIVE mg/dL
Hgb urine dipstick: NEGATIVE
KETONES UR: NEGATIVE mg/dL
LEUKOCYTES UA: NEGATIVE
Nitrite: NEGATIVE
PROTEIN: 30 mg/dL — AB
SPECIFIC GRAVITY, URINE: 1.023 (ref 1.005–1.030)
pH: 7 (ref 5.0–8.0)

## 2017-09-23 LAB — POCT PREGNANCY, URINE: Preg Test, Ur: NEGATIVE

## 2017-09-23 LAB — WET PREP, GENITAL
Clue Cells Wet Prep HPF POC: NONE SEEN
Sperm: NONE SEEN
TRICH WET PREP: NONE SEEN
Yeast Wet Prep HPF POC: NONE SEEN

## 2017-09-23 MED ORDER — IBUPROFEN 800 MG PO TABS
800.0000 mg | ORAL_TABLET | Freq: Once | ORAL | Status: AC
  Administered 2017-09-23: 800 mg via ORAL
  Filled 2017-09-23: qty 1

## 2017-09-23 MED ORDER — IBUPROFEN 800 MG PO TABS: 800.0000 mg | ORAL_TABLET | Freq: Once | ORAL | 0 refills | Status: AC

## 2017-09-23 NOTE — MAU Note (Signed)
Julia Potter is a 27 y.o. here in MAU reporting: +lower abdominal pain Cramping and intermittent LMP: 08/23/2017. On Birth control pills but stopped them because she thought they were contributing to her symptoms Onset of complaint: past week Pain score: 7/10  +white milky discharge Vitals:   09/23/17 1843  BP: 136/86  Pulse: (!) 108  Resp: 17  Temp: 98.4 F (36.9 C)  SpO2: 99%      Lab orders placed from triage: ua

## 2017-09-23 NOTE — MAU Provider Note (Signed)
History   [id number] in with c/o abd pain for 3 days and vaginal discharge x 2 wks. Denies fever. States sexually active and does not make partner wear condom. States periods are irregular.  CSN: [id number]  Arrival date & time 09/23/17  1828   None     Chief Complaint  Patient presents with  . Abdominal Pain    HPI  Past Medical History:  Diagnosis Date  . ALLERGIC RHINITIS   . Anxiety    no meds  . Chlamydia   . Depression    doing ok, in therapy - no meds  . Herpes   . HSV infection   . Pregnancy induced hypertension 2010   resolved after pregnancy  . Rh negative state in antepartum period 05/10/2017   [ ] Rhophylac  . Seasonal allergies     Past Surgical History:  Procedure Laterality Date  . DILATION AND EVACUATION N/A 05/23/2017   Procedure: DILATATION AND EVACUATION;  Surgeon: [place] Bing, MD;  Location: [facility];  Service: Gynecology;  Laterality: N/A;  needs [place]  . INTRAUTERINE DEVICE INSERTION  09/2010  . IUD REMOVAL      Family History  Problem Relation Age of Onset  . Diabetes Maternal Grandmother   . Asthma Daughter     Social History   Tobacco Use  . Smoking status: Never Smoker  . Smokeless tobacco: Never Used  Substance Use Topics  . Alcohol use: Not Currently  . Drug use: No    OB History    Gravida  3   Para  1   Term  0   Preterm  1   AB  2   Living  1     SAB  1   TAB  0   Ectopic  0   Multiple  0   Live Births  1        Obstetric Comments  G1P0101 Admitted to [facility] for PIH, IOL. Delivered a viable female at 34 weeks        Review of Systems  Constitutional: Negative.   HENT: Negative.   Eyes: Negative.   Respiratory: Negative.   Cardiovascular: Negative.   Gastrointestinal: Positive for abdominal pain.  Endocrine: Negative.   Genitourinary: Positive for vaginal discharge.  Musculoskeletal: Negative.   Skin: Negative.   Allergic/Immunologic: Negative.   Neurological: Negative.   Hematological: Negative.    Psychiatric/Behavioral: Negative.     Allergies  Patient has no known allergies.  Home Medications    BP 136/86 (BP Location: Right Arm)   Pulse (!) 108   Temp 98.4 F (36.9 C) (Oral)   Resp 17   Wt 103.5 kg   LMP 08/23/2017   SpO2 99% Comment: ra  BMI 41.72 kg/m   Physical Exam  Constitutional: She is oriented to person, place, and time. She appears well-developed and well-nourished.  HENT:  Head: Normocephalic.  Cardiovascular: Normal rate, regular rhythm, normal heart sounds and intact distal pulses.  Pulmonary/Chest: Effort normal and breath sounds normal.  Abdominal: Soft. Normal appearance and bowel sounds are normal.  Genitourinary: Uterus normal. Vaginal discharge found.  Neurological: She is alert and oriented to person, place, and time.  Skin: Skin is warm and dry.  Psychiatric: She has a normal mood and affect. Her behavior is normal.    MAU Course  Procedures (including critical care time)  Labs Reviewed  WET PREP, GENITAL  URINALYSIS, ROUTINE W REFLEX MICROSCOPIC  POCT PREGNANCY, URINE  GC/CHLAMYDIA PROBE AMP ([place]) NOT AT  [facility]   No results found.   1. Lower abdominal pain   2. Vaginal discharge       MDM  VSS, Wet prep neg. Cultures obtained. UPT neg. Urine neg. Exam no CMT. Uterus NSSC. Whitish discharge noted with amine odor. Discussed importance of consistent condom use for  STD prevention. Will d/c home Rx for motrin 800 to pharmacy.

## 2017-09-23 NOTE — Discharge Instructions (Signed)

## 2017-09-23 NOTE — MAU Note (Signed)
Pt c/o abdominal pain for last week.  States that tylenol has not made it better.  Stopped birth control after to see if it would help resolve it and it has not.  Pt reports that she has irregular, infrequent periods, last on 7/24.

## 2017-09-25 ENCOUNTER — Encounter: Payer: Self-pay | Admitting: Family Medicine

## 2017-09-25 LAB — GC/CHLAMYDIA PROBE AMP (~~LOC~~) NOT AT ARMC
Chlamydia: NEGATIVE
Neisseria Gonorrhea: NEGATIVE

## 2017-12-07 ENCOUNTER — Inpatient Hospital Stay (HOSPITAL_COMMUNITY)
Admission: AD | Admit: 2017-12-07 | Discharge: 2017-12-07 | Disposition: A | Discharge status: Home / Self Care | Payer: Medicaid Other | Source: Ambulatory Visit | Attending: Obstetrics & Gynecology | Admitting: Obstetrics & Gynecology

## 2017-12-07 ENCOUNTER — Encounter (HOSPITAL_COMMUNITY): Payer: Self-pay

## 2017-12-07 ENCOUNTER — Inpatient Hospital Stay (HOSPITAL_COMMUNITY): Payer: Medicaid Other

## 2017-12-07 DIAGNOSIS — R109 Unspecified abdominal pain: Secondary | ICD-10-CM

## 2017-12-07 DIAGNOSIS — Z3A01 Less than 8 weeks gestation of pregnancy: Secondary | ICD-10-CM | POA: Diagnosis not present

## 2017-12-07 DIAGNOSIS — M542 Cervicalgia: Secondary | ICD-10-CM | POA: Diagnosis not present

## 2017-12-07 DIAGNOSIS — O26891 Other specified pregnancy related conditions, first trimester: Secondary | ICD-10-CM | POA: Insufficient documentation

## 2017-12-07 DIAGNOSIS — O10011 Pre-existing essential hypertension complicating pregnancy, first trimester: Secondary | ICD-10-CM | POA: Diagnosis not present

## 2017-12-07 DIAGNOSIS — Z3A09 9 weeks gestation of pregnancy: Secondary | ICD-10-CM | POA: Insufficient documentation

## 2017-12-07 DIAGNOSIS — Z3491 Encounter for supervision of normal pregnancy, unspecified, first trimester: Secondary | ICD-10-CM

## 2017-12-07 DIAGNOSIS — O10911 Unspecified pre-existing hypertension complicating pregnancy, first trimester: Secondary | ICD-10-CM

## 2017-12-07 DIAGNOSIS — R102 Pelvic and perineal pain: Secondary | ICD-10-CM | POA: Diagnosis not present

## 2017-12-07 LAB — COMPREHENSIVE METABOLIC PANEL
ALT: 24 U/L (ref 0–44)
AST: 22 U/L (ref 15–41)
Albumin: 4.1 g/dL (ref 3.5–5.0)
Alkaline Phosphatase: 72 U/L (ref 38–126)
Anion gap: 10 (ref 5–15)
BUN: <5 mg/dL — ABNORMAL LOW (ref 6–20)
CO2: 22 mmol/L (ref 22–32)
Calcium: 9.7 mg/dL (ref 8.9–10.3)
Chloride: 104 mmol/L (ref 98–111)
Creatinine, Ser: 0.74 mg/dL (ref 0.44–1.00)
GFR calc Af Amer: >60 mL/min (ref >60)
GFR calc non Af Amer: >60 mL/min (ref >60)
Glucose, Bld: 95 mg/dL (ref 70–99)
Potassium: 3.7 mmol/L (ref 3.5–5.1)
Sodium: 136 mmol/L (ref 135–145)
Total Bilirubin: 0.7 mg/dL (ref 0.3–1.2)
Total Protein: 7.4 g/dL (ref 6.5–8.1)

## 2017-12-07 LAB — URINALYSIS, ROUTINE W REFLEX MICROSCOPIC
Bilirubin Urine: NEGATIVE
Glucose, UA: NEGATIVE mg/dL
Hgb urine dipstick: NEGATIVE
Ketones, ur: 20 mg/dL — AB
Leukocytes, UA: NEGATIVE
Nitrite: NEGATIVE
Protein, ur: NEGATIVE mg/dL
Specific Gravity, Urine: 1.009 (ref 1.005–1.030)
pH: 6 (ref 5.0–8.0)

## 2017-12-07 LAB — WET PREP, GENITAL
Clue Cells Wet Prep HPF POC: NONE SEEN
Sperm: NONE SEEN
Trich, Wet Prep: NONE SEEN
Yeast Wet Prep HPF POC: NONE SEEN

## 2017-12-07 LAB — POCT PREGNANCY, URINE: PREG TEST UR: POSITIVE — AB

## 2017-12-07 LAB — CBC
HCT: 39.5 % (ref 36.0–46.0)
Hemoglobin: 13.4 g/dL (ref 12.0–15.0)
MCH: 31.2 pg (ref 26.0–34.0)
MCHC: 33.9 g/dL (ref 30.0–36.0)
MCV: 92.1 fL (ref 80.0–100.0)
Platelets: 392 10*3/uL (ref 150–400)
RBC: 4.29 MIL/uL (ref 3.87–5.11)
RDW: 13.6 % (ref 11.5–15.5)
WBC: 8.4 10*3/uL (ref 4.0–10.5)
nRBC: 0 % (ref 0.0–0.2)

## 2017-12-07 LAB — HCG, QUANTITATIVE, PREGNANCY: hCG, Beta Chain, Quant, S: 1649 m[IU]/mL — ABNORMAL HIGH (ref <5)

## 2017-12-07 MED ORDER — LABETALOL HCL 100 MG PO TABS: 100.0000 mg | ORAL_TABLET | Freq: Two times a day (BID) | ORAL | 1 refills | Status: DC

## 2017-12-07 NOTE — MAU Note (Signed)
Pt having cramps and missed her period. Also having neck pain.

## 2017-12-07 NOTE — MAU Provider Note (Signed)
History     CSN: [id number]  Arrival date and time: 12/07/17 1724   First Provider Initiated Contact with Patient 12/07/17 1847      Chief Complaint  Patient presents with  . Abdominal Pain  . Neck Pain   HPI Julia Potter is a 27 y.o. G4P1 at [redacted]w[redacted]d by LMP who presents to MAU with complaints of abdominal pain and possible pregnancy. She reports abdominal pain started occurring this morning, describes abdominal pain as lower abdominal cramping, rates pain 4/10 - has not taken medication for abdominal pain. She denies taking HPT. She reports 1 occurrence of neck pain that is resolved since being in MAU. She denies vaginal bleeding, vaginal discharge, N/V or urinary symptoms. She denies hx of hypertension during pregnancy or outside of pregnancy.    OB History    Gravida  4   Para  1   Term  0   Preterm  1   AB  2   Living  1     SAB  1   TAB  0   Ectopic  0   Multiple  0   Live Births  1        Obstetric Comments  G1P0101 Admitted to [facility] for PIH, IOL. Delivered a viable female at 34 weeks        Past Medical History:  Diagnosis Date  . ALLERGIC RHINITIS   . Anxiety    no meds  . Chlamydia   . Depression    doing ok, in therapy - no meds  . Herpes   . HSV infection   . Pregnancy induced hypertension 2010   resolved after pregnancy  . Rh negative state in antepartum period 05/10/2017   [ ] Rhophylac  . Seasonal allergies     Past Surgical History:  Procedure Laterality Date  . DILATION AND EVACUATION N/A 05/23/2017   Procedure: DILATATION AND EVACUATION;  Surgeon: [place] Bing, MD;  Location: [facility];  Service: Gynecology;  Laterality: N/A;  needs [place]  . INTRAUTERINE DEVICE INSERTION  09/2010  . IUD REMOVAL      Family History  Problem Relation Age of Onset  . Diabetes Maternal Grandmother   . Asthma Daughter     Social History   Tobacco Use  . Smoking status: Never Smoker  . Smokeless tobacco: Never Used  Substance Use Topics  .  Alcohol use: Not Currently  . Drug use: No    Allergies: No Known Allergies  Medications Prior to Admission  Medication Sig Dispense Refill Last Dose  . diphenhydrAMINE (BENADRYL) 25 MG tablet Take 25 mg by mouth daily as needed for allergies.   More than a month at Unknown time  . promethazine (PHENERGAN) 25 MG tablet Take 25 mg by mouth every 6 (six) hours as needed for nausea or vomiting.   More than a month at Unknown time  . Simethicone (GAS-X PO) Take 1 tablet by mouth as needed.   More than a month at Unknown time    Review of Systems  Constitutional: Negative.   Respiratory: Negative.   Cardiovascular: Negative.   Gastrointestinal: Positive for abdominal pain. Negative for constipation, diarrhea, nausea and vomiting.  Genitourinary: Negative.   Musculoskeletal: Positive for neck pain.       1 occurrence, no pain currently    Physical Exam   Vitals:   12/07/17 1738 12/07/17 1753 12/07/17 1918  BP: (!) 159/93 (!) 145/88 136/83  Pulse: (!) 129 (!) 116   Resp: 18  Temp: 99.7 F (37.6 C)    TempSrc: Oral    SpO2: 98%    Weight: 100.7 kg    Height: 5\' 2"  (1.575 m)     Physical Exam  Nursing note and vitals reviewed. Constitutional: She is oriented to person, place, and time. She appears well-developed and well-nourished. No distress.  Cardiovascular: Normal rate, regular rhythm and normal heart sounds.  Respiratory: Effort normal and breath sounds normal. No respiratory distress. She has no wheezes.  GI: Soft. She exhibits no distension. There is no tenderness. There is no rebound.  Genitourinary:  Genitourinary Comments: Vaginal swabs obtained   Neurological: She is alert and oriented to person, place, and time.  Psychiatric: She has a normal mood and affect. Her behavior is normal. Thought content normal.   MAU Course  Procedures  MDM Orders Placed This Encounter  Procedures  . Wet prep, genital  . US OB LESS THAN 14 WEEKS WITH OB TRANSVAGINAL  .  Urinalysis, Routine w reflex microscopic  . CBC  . hCG, quantitative, pregnancy  . Comprehensive metabolic panel  . Pregnancy, urine POC   Results for orders placed or performed during the hospital encounter of 12/07/17 (from the past 24 hour(s))  Urinalysis, Routine w reflex microscopic     Status: Abnormal   Collection Time: 12/07/17  5:37 PM  Result Value Ref Range   Color, Urine STRAW (A) YELLOW   APPearance CLEAR CLEAR   Specific Gravity, Urine 1.009 1.005 - 1.030   pH 6.0 5.0 - 8.0   Glucose, UA NEGATIVE NEGATIVE mg/dL   Hgb urine dipstick NEGATIVE NEGATIVE   Bilirubin Urine NEGATIVE NEGATIVE   Ketones, ur 20 (A) NEGATIVE mg/dL   Protein, ur NEGATIVE NEGATIVE mg/dL   Nitrite NEGATIVE NEGATIVE   Leukocytes, UA NEGATIVE NEGATIVE  Wet prep, genital     Status: Abnormal   Collection Time: 12/07/17  6:47 PM  Result Value Ref Range   Yeast Wet Prep HPF POC NONE SEEN NONE SEEN   Trich, Wet Prep NONE SEEN NONE SEEN   Clue Cells Wet Prep HPF POC NONE SEEN NONE SEEN   WBC, Wet Prep HPF POC FEW (A) NONE SEEN   Sperm NONE SEEN   Pregnancy, urine POC     Status: Abnormal   Collection Time: 12/07/17  6:49 PM  Result Value Ref Range   Preg Test, Ur POSITIVE (A) NEGATIVE  CBC     Status: None   Collection Time: 12/07/17  6:54 PM  Result Value Ref Range   WBC 8.4 4.0 - 10.5 K/uL   RBC 4.29 3.87 - 5.11 MIL/uL   Hemoglobin 13.4 12.0 - 15.0 g/dL   HCT 54.0 98.1 - 19.1 %   MCV 92.1 80.0 - 100.0 fL   MCH 31.2 26.0 - 34.0 pg   MCHC 33.9 30.0 - 36.0 g/dL   RDW 47.8 29.5 - 62.1 %   Platelets 392 150 - 400 K/uL   nRBC 0.0 0.0 - 0.2 %  hCG, quantitative, pregnancy     Status: Abnormal   Collection Time: 12/07/17  6:54 PM  Result Value Ref Range   hCG, Beta Chain, Quant, S 1,649 (H) <5 mIU/mL  Comprehensive metabolic panel     Status: Abnormal   Collection Time: 12/07/17  6:54 PM  Result Value Ref Range   Sodium 136 135 - 145 mmol/L   Potassium 3.7 3.5 - 5.1 mmol/L   Chloride 104 98  - 111 mmol/L   CO2 22 22 -  32 mmol/L   Glucose, Bld 95 70 - 99 mg/dL   BUN <5 (L) 6 - 20 mg/dL   Creatinine, Ser 1.61 0.44 - 1.00 mg/dL   Calcium 9.7 8.9 - 09.6 mg/dL   Total Protein 7.4 6.5 - 8.1 g/dL   Albumin 4.1 3.5 - 5.0 g/dL   AST 22 15 - 41 U/L   ALT 24 0 - 44 U/L   Alkaline Phosphatase 72 38 - 126 U/L   Total Bilirubin 0.7 0.3 - 1.2 mg/dL   GFR calc non Af Amer >60 >60 mL/min   GFR calc Af Amer >60 >60 mL/min   Anion gap 10 5 - 15   US Ob Less Than 14 Weeks With Ob Transvaginal  Result Date: 12/07/2017 CLINICAL DATA:  Missed period. Abdominal pain. Quantitative beta HCG level 1,649. Patient is 9 weeks and 5 days pregnant based on her last menstrual period. EXAM: OBSTETRIC <14 WK [place] AND TRANSVAGINAL OB US TECHNIQUE: Both transabdominal and transvaginal ultrasound examinations were performed for complete evaluation of the gestation as well as the maternal uterus, adnexal regions, and pelvic cul-de-sac. Transvaginal technique was performed to assess early pregnancy. COMPARISON:  None. FINDINGS: Intrauterine gestational sac: Single Yolk sac:  Visualized. Embryo:  Not Visualized. Cardiac Activity: Not Visualized. MSD: 5.1 mm   5 w   2 d Subchorionic hemorrhage:  None visualized. Maternal uterus/adnexae: Right ovary normal in size with a corpus luteum. Left ovary not visualized. No adnexal masses. No pelvic free fluid. IMPRESSION: 1. Gestational sac and yolk sac consistent with an early intrauterine pregnancy. No embryo visualized, however. Recommend follow-up quantitative B-HCG levels and follow-up US in 14 days to assess viability. This recommendation follows SRU consensus guidelines: Diagnostic Criteria for Nonviable Pregnancy Early in the First Trimester. Malva Limes Med 2013; 045:4098-11. 2. No evidence of a pregnancy complication. Electronically Signed   By: Amie Portland M.D.   On: 12/07/2017 20:42   Discussed results of lab work and [place] with patient. Diagnosed with chronic hypertension-  started on Labetalol 100mg  BID with BP check in the office in 1 weeks after initiation of medication. Wet prep negative, UA negative. GC/C pending. Discussed reasons to return to MAU. Encouraged patient to make initial prenatal appointment. Pt stable at time of discharge.   Assessment and Plan   1. Normal IUP (intrauterine pregnancy) on prenatal ultrasound, first trimester   2. Abdominal pain during pregnancy in first trimester   3. Maternal chronic hypertension in first trimester    Discharge home  Rx for Labetalol  Discussed reasons to return to MAU  Follow up as scheduled  Continue prenatal appointments  BP check scheduled for 11/14  Follow-up Information    [facility] Follow up.   Why:  Return to MAU as needed for worsening symptoms or vaginal bleeding  Contact information: [address] [id number] mc [place] [address] [phone]         Allergies as of 12/07/2017   No Known Allergies     Medication List    TAKE these medications   diphenhydrAMINE 25 MG tablet Commonly known as:  BENADRYL Take 25 mg by mouth daily as needed for allergies.   GAS-X PO Take 1 tablet by mouth as needed.   labetalol 100 MG tablet Commonly known as:  NORMODYNE Take 1 tablet (100 mg total) by mouth 2 (two) times daily.   promethazine 25 MG tablet Commonly known as:  PHENERGAN Take 25 mg by mouth every 6 (  six) hours as needed for nausea or vomiting.       Sharyon Cable CNM  12/07/2017, 8:56 PM

## 2017-12-08 ENCOUNTER — Other Ambulatory Visit: Payer: Self-pay

## 2017-12-08 LAB — GC/CHLAMYDIA PROBE AMP (~~LOC~~) NOT AT ARMC
Chlamydia: NEGATIVE
Neisseria Gonorrhea: NEGATIVE

## 2017-12-08 MED ORDER — LABETALOL HCL 100 MG PO TABS: 100.0000 mg | ORAL_TABLET | Freq: Two times a day (BID) | ORAL | 1 refills | Status: DC

## 2017-12-08 NOTE — Progress Notes (Signed)
Patient called stating she is unable to pick up labetalol at the pharmacy it was sent to. Will send Rx to [facility] pharmacy.  Rolm Bookbinder, CNM 12/08/17 4:22 PM

## 2017-12-14 ENCOUNTER — Ambulatory Visit (INDEPENDENT_AMBULATORY_CARE_PROVIDER_SITE_OTHER): Payer: Self-pay | Admitting: *Deleted

## 2017-12-14 VITALS — BP 129/79 | HR 88 | Wt 219.1 lb

## 2017-12-14 DIAGNOSIS — O209 Hemorrhage in early pregnancy, unspecified: Secondary | ICD-10-CM

## 2017-12-14 NOTE — Progress Notes (Signed)
I have reviewed the chart and agree with nursing staff's documentation of this patient's encounter.  Catalina AntiguaPeggy Jany Buckwalter, MD 12/14/2017 2:14 PM

## 2017-12-14 NOTE — Progress Notes (Signed)
States since MAU no further bleeding until last night and had bleeding for about an hour.  Denies pain today. Here for bp check . BP wnl advised to keep taking labetolol as ordered. Also advised to start prenatal care. Per chart review and discussion with patient and Dr.Constant will do routine bhcg today. And plan on starting prenatal care in about 3 weeks when she should be 10 weeks by LMP. Advised to report to MAU for bleeding like a period or severe headache. She voices understanding.

## 2017-12-15 LAB — BETA HCG QUANT (REF LAB): hCG Quant: 2274 m[IU]/mL

## 2017-12-18 ENCOUNTER — Telehealth: Payer: Self-pay | Admitting: *Deleted

## 2017-12-18 NOTE — Telephone Encounter (Signed)
Pt left message stating she had lab test on 11/14 and her results are not on Mychart. Also she is still having some spotting and wants to know what she should do.  Please call back.

## 2017-12-19 ENCOUNTER — Encounter: Payer: Self-pay | Admitting: Family Medicine

## 2017-12-19 ENCOUNTER — Telehealth: Payer: Self-pay

## 2017-12-19 NOTE — Telephone Encounter (Signed)
Returned patients call for results explained her Hcg Quant and asked pt was she still having any bleeding. Pt declined bleeding and I advised pt that her levels were rising on her quant and to keep her appointment on 01/19/18. Advised pt that if she continues to bleed to come to MAU to be seen. Pt verbalized understanding and had no questions.

## 2017-12-20 ENCOUNTER — Inpatient Hospital Stay (HOSPITAL_COMMUNITY)
Admission: AD | Admit: 2017-12-20 | Discharge: 2017-12-20 | Disposition: A | Discharge status: Home / Self Care | Payer: Medicaid Other | Source: Ambulatory Visit | Attending: Obstetrics & Gynecology | Admitting: Obstetrics & Gynecology

## 2017-12-20 ENCOUNTER — Other Ambulatory Visit: Payer: Self-pay

## 2017-12-20 ENCOUNTER — Encounter (HOSPITAL_COMMUNITY): Payer: Self-pay

## 2017-12-20 DIAGNOSIS — O131 Gestational [pregnancy-induced] hypertension without significant proteinuria, first trimester: Secondary | ICD-10-CM | POA: Insufficient documentation

## 2017-12-20 DIAGNOSIS — Z3A11 11 weeks gestation of pregnancy: Secondary | ICD-10-CM | POA: Diagnosis not present

## 2017-12-20 DIAGNOSIS — Z6791 Unspecified blood type, Rh negative: Secondary | ICD-10-CM | POA: Insufficient documentation

## 2017-12-20 DIAGNOSIS — O26891 Other specified pregnancy related conditions, first trimester: Secondary | ICD-10-CM | POA: Diagnosis not present

## 2017-12-20 DIAGNOSIS — O200 Threatened abortion: Secondary | ICD-10-CM | POA: Diagnosis not present

## 2017-12-20 DIAGNOSIS — R030 Elevated blood-pressure reading, without diagnosis of hypertension: Secondary | ICD-10-CM

## 2017-12-20 DIAGNOSIS — O26851 Spotting complicating pregnancy, first trimester: Secondary | ICD-10-CM | POA: Diagnosis present

## 2017-12-20 LAB — URINALYSIS, ROUTINE W REFLEX MICROSCOPIC
Bilirubin Urine: NEGATIVE
Glucose, UA: NEGATIVE mg/dL
Hgb urine dipstick: NEGATIVE
Ketones, ur: NEGATIVE mg/dL
LEUKOCYTES UA: NEGATIVE
Nitrite: NEGATIVE
PH: 7 (ref 5.0–8.0)
Protein, ur: NEGATIVE mg/dL
SPECIFIC GRAVITY, URINE: 1.003 — AB (ref 1.005–1.030)

## 2017-12-20 MED ORDER — RHO D IMMUNE GLOBULIN 1500 UNIT/2ML IJ SOSY
300.0000 ug | PREFILLED_SYRINGE | Freq: Once | INTRAMUSCULAR | Status: AC
  Administered 2017-12-20: 300 ug via INTRAMUSCULAR
  Filled 2017-12-20: qty 2

## 2017-12-20 NOTE — MAU Note (Signed)
Presents with c/o spotting with wiping x1 week.  Reports minimal abdominal cramping.

## 2017-12-20 NOTE — MAU Provider Note (Signed)
[facility] Attending MAU Note  Chief Complaint: Vaginal Bleeding    First Provider Initiated Contact with Patient 12/20/17 2009      SUBJECTIVE Julia Potter is a 27 y.o. [id number] at [redacted]w[redacted]d by LMP who presents with vaginal spotting x 7 days. Previous u/s shows IUP with yolk sac but no fetal pole. Denies pain. Still spotting, today and told to come in. Has not passed any tissue. Patient is RH neg.  Past Medical History:  Diagnosis Date  . ALLERGIC RHINITIS   . Anxiety    no meds  . Chlamydia   . Depression    doing ok, in therapy - no meds  . Herpes   . HSV infection   . Pregnancy induced hypertension 2010   resolved after pregnancy  . Rh negative state in antepartum period 05/10/2017   [ ] Rhophylac  . Seasonal allergies    OB History  Gravida Para Term Preterm AB Living  4 1 0 1 2 1   SAB TAB Ectopic Multiple Live Births  1 0 0 0 1    # Outcome Date GA Lbr Len/2nd Weight Sex Delivery Anes PTL Lv  4 Current           3 AB 05/2017 [redacted]w[redacted]d            Complications: Missed ab, Triplets, all stillborn, S/P D&C (status post dilation and curettage)  2 SAB 2017             Complications: Missed abortion  1 Preterm 2011 [redacted]w[redacted]d    Vag-Spont       Obstetric Comments  G1P0101 Admitted to [facility] for PIH, IOL. Delivered a viable female at 34 weeks   Past Surgical History:  Procedure Laterality Date  . DILATION AND EVACUATION N/A 05/23/2017   Procedure: DILATATION AND EVACUATION;  Surgeon: [place] Bing, MD;  Location: [facility];  Service: Gynecology;  Laterality: N/A;  needs [place]  . INTRAUTERINE DEVICE INSERTION  09/2010  . IUD REMOVAL     Social History   Socioeconomic History  . Marital status: Single    Spouse name: Not on file  . Number of children: Not on file  . Years of education: Not on file  . Highest education level: Not on file  Occupational History  . Not on file  Social Needs  . Financial resource strain: Not on file  . Food insecurity:    Worry: Not  on file    Inability: Not on file  . Transportation needs:    Medical: Not on file    Non-medical: Not on file  Tobacco Use  . Smoking status: Never Smoker  . Smokeless tobacco: Never Used  Substance and Sexual Activity  . Alcohol use: Not Currently  . Drug use: No  . Sexual activity: Yes    Birth control/protection: None  Lifestyle  . Physical activity:    Days per week: Not on file    Minutes per session: Not on file  . Stress: Not on file  Relationships  . Social connections:    Talks on phone: Not on file    Gets together: Not on file    Attends religious service: Not on file    Active member of club or organization: Not on file    Attends meetings of clubs or organizations: Not on file    Relationship status: Not on file  . Intimate partner violence:    Fear of current or ex partner: Not on file  Emotionally abused: Not on file    Physically abused: Not on file    Forced sexual activity: Not on file  Other Topics Concern  . Not on file  Social History Narrative   Lives with mom, sister and brother   Has one daughter born in [address]   Wants to go to law school   No current facility-administered medications on file prior to encounter.    Current Outpatient Medications on File Prior to Encounter  Medication Sig Dispense Refill  . diphenhydrAMINE (BENADRYL) 25 MG tablet Take 25 mg by mouth daily as needed for allergies.    Marland Kitchen. labetalol (NORMODYNE) 100 MG tablet Take 1 tablet (100 mg total) by mouth 2 (two) times daily. 60 tablet 1  . promethazine (PHENERGAN) 25 MG tablet Take 25 mg by mouth every 6 (six) hours as needed for nausea or vomiting.    . Simethicone (GAS-X PO) Take 1 tablet by mouth as needed.    . [DISCONTINUED] medroxyPROGESTERone (DEPO-PROVERA) 150 MG/ML injection Inject 150 mg into the muscle every 3 (three) months.       No Known Allergies  ROS: Pertinent items in HPI  OBJECTIVE BP 134/72 (BP Location: Right Arm)   Pulse 97   Temp  97.9 F (36.6 C) (Oral)   Resp 20   Ht 5\' 2"  (1.575 m)   Wt 101.7 kg   LMP 09/30/2017   SpO2 100%   BMI 41.02 kg/m  CONSTITUTIONAL: Well-developed, well-nourished female in no acute distress.  HENT:  Normocephalic, atraumatic, External right and left ear normal. Oropharynx is clear and moist EYES: Conjunctivae and EOM are normal. Pupils are equal, round, and reactive to light. No scleral icterus.  NECK: Normal range of motion, supple, no masses.  Normal thyroid.  SKIN: Skin is warm and dry. No rash noted. Not diaphoretic. No erythema. No pallor. NEUROLGIC: Alert and oriented to person, place, and time. Normal reflexes, muscle tone coordination. No cranial nerve deficit noted. PSYCHIATRIC: Normal mood and affect. Normal behavior. Normal judgment and thought content. CARDIOVASCULAR: Normal heart rate noted RESPIRATORY: Effort and breath sounds normal, no problems with respiration noted. ABDOMEN: Soft, normal bowel sounds, no distention noted.  No tenderness, rebound or guarding.  PELVIC: Normal appearing external genitalia; normal appearing vaginal mucosa and cervix.  No discharge, dark red blood in vault MUSCULOSKELETAL: Normal range of motion. No tenderness.  No cyanosis, clubbing, or edema.  2+ distal pulses.  LAB RESULTS Results for orders placed or performed during the hospital encounter of 12/20/17 (from the past 48 hour(s))  Urinalysis, Routine w reflex microscopic     Status: Abnormal   Collection Time: 12/20/17  6:42 PM  Result Value Ref Range   Color, Urine STRAW (A) YELLOW   APPearance CLEAR CLEAR   Specific Gravity, Urine 1.003 (L) 1.005 - 1.030   pH 7.0 5.0 - 8.0   Glucose, UA NEGATIVE NEGATIVE mg/dL   Hgb urine dipstick NEGATIVE NEGATIVE   Bilirubin Urine NEGATIVE NEGATIVE   Ketones, ur NEGATIVE NEGATIVE mg/dL   Protein, ur NEGATIVE NEGATIVE mg/dL   Nitrite NEGATIVE NEGATIVE   Leukocytes, UA NEGATIVE NEGATIVE    Comment: Performed at [facility], [address] [place], [place] [id number]    IMAGING [place] Ob Less Than 14 Weeks With Ob Transvaginal  Result Date: 12/07/2017 CLINICAL DATA:  Missed period. Abdominal pain. Quantitative beta HCG level 1,649. Patient is 9 weeks and 5 days pregnant based on her last menstrual period. EXAM: OBSTETRIC <14 WK  [place] AND TRANSVAGINAL OB US TECHNIQUE: Both transabdominal and transvaginal ultrasound examinations were performed for complete evaluation of the gestation as well as the maternal uterus, adnexal regions, and pelvic cul-de-sac. Transvaginal technique was performed to assess early pregnancy. COMPARISON:  None. FINDINGS: Intrauterine gestational sac: Single Yolk sac:  Visualized. Embryo:  Not Visualized. Cardiac Activity: Not Visualized. MSD: 5.1 mm   5 w   2 d Subchorionic hemorrhage:  None visualized. Maternal uterus/adnexae: Right ovary normal in size with a corpus luteum. Left ovary not visualized. No adnexal masses. No pelvic free fluid. IMPRESSION: 1. Gestational sac and yolk sac consistent with an early intrauterine pregnancy. No embryo visualized, however. Recommend follow-up quantitative B-HCG levels and follow-up US in 14 days to assess viability. This recommendation follows SRU consensus guidelines: Diagnostic Criteria for Nonviable Pregnancy Early in the First Trimester. Malva Limes Med 2013; 914:7829-56. 2. No evidence of a pregnancy complication. Electronically Signed   By: Amie Portland M.D.   On: 12/07/2017 20:42   Limited U/S bedside today SIUP, with + yolk sac, + FP, + flicker MAU COURSE  ASSESSMENT 1. Threatened abortion   2. Transient hypertension   3.      RH neg  PLAN Discharge home Threatened ab precautions Rhogam today  Allergies as of 12/20/2017   No Known Allergies     Medication List    TAKE these medications   diphenhydrAMINE 25 MG tablet Commonly known as:  BENADRYL Take 25 mg by mouth daily as needed for allergies.   GAS-X PO Take 1 tablet by mouth as needed.    labetalol 100 MG tablet Commonly known as:  NORMODYNE Take 1 tablet (100 mg total) by mouth 2 (two) times daily.   promethazine 25 MG tablet Commonly known as:  PHENERGAN Take 25 mg by mouth every 6 (six) hours as needed for nausea or vomiting.        Reva Bores, MD 12/20/2017 8:32 PM

## 2017-12-20 NOTE — Discharge Instructions (Signed)
Threatened Miscarriage °A threatened miscarriage occurs when you have vaginal bleeding during your first 20 weeks of pregnancy but the pregnancy has not ended. If you have vaginal bleeding during this time, your health care provider will do tests to make sure you are still pregnant. If the tests show you are still pregnant and the developing baby (fetus) inside your womb (uterus) is still growing, your condition is considered a threatened miscarriage. °A threatened miscarriage does not mean your pregnancy will end, but it does increase the risk of losing your pregnancy (complete miscarriage). °What are the causes? °The cause of a threatened miscarriage is usually not known. If you go on to have a complete miscarriage, the most common cause is an abnormal number of chromosomes in the developing baby. Chromosomes are the structures inside cells that hold all your genetic material. °Some causes of vaginal bleeding that do not result in miscarriage include: °· Having sex. °· Having an infection. °· Normal hormone changes of pregnancy. °· Bleeding that occurs when an egg implants in your uterus. ° °What increases the risk? °Risk factors for bleeding in early pregnancy include: °· Obesity. °· Smoking. °· Drinking excessive amounts of alcohol or caffeine. °· Recreational drug use. ° °What are the signs or symptoms? °· Light vaginal bleeding. °· Mild abdominal pain or cramps. °How is this diagnosed? °If you have bleeding with or without abdominal pain before 20 weeks of pregnancy, your health care provider will do tests to check whether you are still pregnant. One important test involves using sound waves and a computer (ultrasound) to create images of the inside of your uterus. Other tests include an internal exam of your vagina and uterus (pelvic exam) and measurement of your baby’s heart rate. °You may be diagnosed with a threatened miscarriage if: °· Ultrasound testing shows you are still pregnant. °· Your baby’s heart  rate is strong. °· A pelvic exam shows that the opening between your uterus and your vagina (cervix) is closed. °· Your heart rate and blood pressure are stable. °· Blood tests confirm you are still pregnant. ° °How is this treated? °No treatments have been shown to prevent a threatened miscarriage from going on to a complete miscarriage. However, the right home care is important. °Follow these instructions at home: °· Make sure you keep all your appointments for prenatal care. This is very important. °· Get plenty of rest. °· Do not have sex or use tampons if you have vaginal bleeding. °· Do not douche. °· Do not smoke or use recreational drugs. °· Do not drink alcohol. °· Avoid caffeine. °Contact a health care provider if: °· You have light vaginal bleeding or spotting while pregnant. °· You have abdominal pain or cramping. °· You have a fever. °Get help right away if: °· You have heavy vaginal bleeding. °· You have blood clots coming from your vagina. °· You have severe low back pain or abdominal cramps. °· You have fever, chills, and severe abdominal pain. °This information is not intended to replace advice given to you by your health care provider. Make sure you discuss any questions you have with your health care provider. °Document Released: 01/17/2005 Document Revised: 06/25/2015 Document Reviewed: 11/13/2012 °Elsevier Interactive Patient Education © 2018 [employer] Inc[employer] ° °

## 2017-12-21 LAB — RH IG WORKUP (INCLUDES ABO/RH)
ABO/RH(D): A NEG
Antibody Screen: NEGATIVE
Gestational Age(Wks): 11.4
UNIT DIVISION: 0

## 2018-01-03 ENCOUNTER — Inpatient Hospital Stay (HOSPITAL_COMMUNITY): Payer: Medicaid Other

## 2018-01-03 ENCOUNTER — Inpatient Hospital Stay (HOSPITAL_COMMUNITY)
Admission: AD | Admit: 2018-01-03 | Discharge: 2018-01-03 | Disposition: A | Discharge status: Home / Self Care | Payer: Medicaid Other | Source: Ambulatory Visit | Attending: Obstetrics & Gynecology | Admitting: Obstetrics & Gynecology

## 2018-01-03 ENCOUNTER — Encounter (HOSPITAL_COMMUNITY): Payer: Self-pay | Admitting: *Deleted

## 2018-01-03 DIAGNOSIS — O021 Missed abortion: Secondary | ICD-10-CM | POA: Diagnosis not present

## 2018-01-03 DIAGNOSIS — Z3A01 Less than 8 weeks gestation of pregnancy: Secondary | ICD-10-CM | POA: Diagnosis not present

## 2018-01-03 DIAGNOSIS — O209 Hemorrhage in early pregnancy, unspecified: Secondary | ICD-10-CM | POA: Diagnosis not present

## 2018-01-03 DIAGNOSIS — N939 Abnormal uterine and vaginal bleeding, unspecified: Secondary | ICD-10-CM | POA: Diagnosis present

## 2018-01-03 LAB — URINALYSIS, ROUTINE W REFLEX MICROSCOPIC
Bilirubin Urine: NEGATIVE
Glucose, UA: NEGATIVE mg/dL
Ketones, ur: NEGATIVE mg/dL
Leukocytes, UA: NEGATIVE
Nitrite: NEGATIVE
PH: 6 (ref 5.0–8.0)
Protein, ur: NEGATIVE mg/dL
SPECIFIC GRAVITY, URINE: 1.008 (ref 1.005–1.030)

## 2018-01-03 MED ORDER — IBUPROFEN 800 MG PO TABS: 800.0000 mg | ORAL_TABLET | Freq: Three times a day (TID) | ORAL | 0 refills | Status: DC | PRN

## 2018-01-03 MED ORDER — MISOPROSTOL 200 MCG PO TABS: 800.0000 ug | ORAL_TABLET | Freq: Once | ORAL | 0 refills | Status: DC

## 2018-01-03 MED ORDER — HYDROCODONE-ACETAMINOPHEN 5-325 MG PO TABS: 1.0000 | ORAL_TABLET | Freq: Four times a day (QID) | ORAL | 0 refills | Status: AC | PRN

## 2018-01-03 MED ORDER — ONDANSETRON 8 MG PO TBDP: 8.0000 mg | ORAL_TABLET | Freq: Three times a day (TID) | ORAL | 0 refills | Status: DC | PRN

## 2018-01-03 NOTE — Discharge Instructions (Signed)
Dissolve 4 cytotec (misoprostol tabs) in your cheeks at one time. Allow them to dissolve. If within 24 hours you do not have bleeding and cramping, then you can repeat the dose. If you do not have bleeding and cramping within 24 hours of the second dose call the clinic     FACTS YOU SHOULD KNOW  WHAT IS AN EARLY PREGNANCY FAILURE? Once the egg is fertilized with the sperm and begins to develop, it attaches to the lining of the uterus. This early pregnancy tissue may not develop into an embryo (the beginning stage of a baby). Sometimes an embryo does develop but does not continue to grow. These problems can be seen on ultrasound.   MANAGEMNT OF EARLY PREGNANCY FAILURE: About 4 out of 100 (0.25%) women will have a pregnancy loss in her lifetime.  One in five pregnancies is found to be an early pregnancy failure.  There are 3 ways to care for an early pregnancy failure:   (1) Surgery, (2) Medicine, (3) Waiting for you to pass the pregnancy on your own. The decision as to how to proceed after being diagnosed with and early pregnancy failure is an individual one.  The decision can be made only after appropriate counseling.  You need to weigh the pros and cons of the 3 choices. Then you can make the choice that works for you. SURGERY (D&E)  Procedure over in 1 day  Requires being put to sleep  Bleeding may be light  Possible problems during surgery, including injury to womb(uterus)  Care provider has more control Medicine (CYTOTEC)  The complete procedure may take days to weeks  No Surgery  Bleeding may be heavy at times  There may be drug side effects  Patient has more control Waiting  You may choose to wait, in which case your own body may complete the passing of the abnormal early pregnancy on its own in about 2-4 weeks  Your bleeding may be heavy at times  There is a small possibility that you may need surgery if the bleeding is too much or not all of the pregnancy has  passed. CYTOTEC MANAGEMENT Prostaglandins (cytotec) are the most widely used drug for this purpose. They cause the uterus to cramp and contract. You will place the medicine yourself inside your vagina in the privacy of your home. Empting of the uterus should occur within 3 days but the process may continue for several weeks. The bleeding may seem heavy at times. POSSIBLE SIDE EFFECTS FROM CYTOTEC  Nausea   Vomiting  Diarrhea Fever  Chills  Hot Flashes Side effects  from the process of the early pregnancy failure include:  Cramping  Bleeding  Headaches  Dizziness RISKS: This is a low risk procedure. Less than 1 in 100 women has a complication. An incomplete passage of the early pregnancy may occur. Also, Hemorrhage (heavy bleeding) could happen.  Rarely the pregnancy will not be passed completely. Excessively heavy bleeding may occur.  Your doctor may need to perform surgery to empty the uterus (D&E). Afterwards: Everybody will feel differently after the early pregnancy completion. You may have soreness or cramps for a day or two. You may have soreness or cramps for day or two.  You may have light bleeding for up to 2 weeks. You may be as active as you feel like being. If you have any of the following problems you may call Maternity Admissions Unit at [phone].  If you have pain that does not get better  with pain medication  Bleeding that soaks through 2 thick full-sized sanitary pads in an hour  Cramps that last longer than 2 days  Foul smelling discharge  Fever above 100.4 degrees F Even if you do not have any of these symptoms, you should have a follow-up exam to make sure you are healing properly. This appointment will be made for you before you leave the hospital. Your next normal period will start again in 4-6 week after the loss. You can get pregnant soon after the loss, so use birth control right away. Finally: Make sure all your questions are answered before during and  after any procedure. Follow up with medical care and family planning methods.

## 2018-01-03 NOTE — MAU Note (Signed)
Pt reports she was seen here for bleeding in mid November and it stopped but spotting started again today. Mild lower abd pain.

## 2018-01-03 NOTE — MAU Provider Note (Signed)
History     CSN: [id number]  Arrival date and time: 01/03/18 1723   First Provider Initiated Contact with Patient 01/03/18 1809      Chief Complaint  Patient presents with  . Vaginal Bleeding  . Abdominal Pain   Vaginal Bleeding  The patient's primary symptoms include pelvic pain and vaginal bleeding. The patient's pertinent negatives include no vaginal discharge. This is a new problem. The current episode started today (about one hour PTA ). The problem occurs constantly. The problem has been unchanged. The pain is mild. The problem affects both sides. She is pregnant. Pertinent negatives include no chills, dysuria, fever, frequency, nausea, urgency or vomiting. The vaginal discharge was bloody. The vaginal bleeding is spotting. She has not been passing clots. She has not been passing tissue. Nothing aggravates the symptoms. She has tried nothing for the symptoms.    OB History    Gravida  4   Para  1   Term  0   Preterm  1   AB  2   Living  1     SAB  1   TAB  0   Ectopic  0   Multiple  0   Live Births  1        Obstetric Comments  G1P0101 Admitted to [facility] for PIH, IOL. Delivered a viable female at 34 weeks        Past Medical History:  Diagnosis Date  . ALLERGIC RHINITIS   . Anxiety    no meds  . Chlamydia   . Depression    doing ok, in therapy - no meds  . Herpes   . HSV infection   . Pregnancy induced hypertension 2010   resolved after pregnancy  . Rh negative state in antepartum period 05/10/2017   [ ] Rhophylac  . Seasonal allergies     Past Surgical History:  Procedure Laterality Date  . DILATION AND EVACUATION N/A 05/23/2017   Procedure: DILATATION AND EVACUATION;  Surgeon: [place] Bing, MD;  Location: [facility];  Service: Gynecology;  Laterality: N/A;  needs [place]  . INTRAUTERINE DEVICE INSERTION  09/2010  . IUD REMOVAL      Family History  Problem Relation Age of Onset  . Diabetes Maternal Grandmother   . Asthma Daughter      Social History   Tobacco Use  . Smoking status: Never Smoker  . Smokeless tobacco: Never Used  Substance Use Topics  . Alcohol use: Not Currently  . Drug use: No    Allergies: No Known Allergies  Medications Prior to Admission  Medication Sig Dispense Refill Last Dose  . diphenhydrAMINE (BENADRYL) 25 MG tablet Take 25 mg by mouth daily as needed for allergies.   Taking  . labetalol (NORMODYNE) 100 MG tablet Take 1 tablet (100 mg total) by mouth 2 (two) times daily. 60 tablet 1 Taking  . promethazine (PHENERGAN) 25 MG tablet Take 25 mg by mouth every 6 (six) hours as needed for nausea or vomiting.   Taking  . Simethicone (GAS-X PO) Take 1 tablet by mouth as needed.   Taking    Review of Systems  Constitutional: Negative for chills and fever.  Gastrointestinal: Negative for nausea and vomiting.  Genitourinary: Positive for pelvic pain and vaginal bleeding. Negative for dysuria, frequency, urgency and vaginal discharge.   Physical Exam   Blood pressure 137/76, pulse 94, temperature 98 F (36.7 C), temperature source Oral, resp. rate 16, height 5\' 2"  (1.575 m), weight 103 kg, last menstrual  period 09/30/2017, SpO2 99 %, unknown if currently breastfeeding.  Physical Exam  Nursing note and vitals reviewed. Constitutional: She is oriented to person, place, and time. She appears well-developed and well-nourished. No distress.  HENT:  Head: Normocephalic.  Cardiovascular: Normal rate.  Respiratory: Effort normal.  GI: Soft. There is no tenderness. There is no rebound.  Neurological: She is alert and oriented to person, place, and time.  Skin: Skin is warm and dry.  Psychiatric: She has a normal mood and affect.   Pt informed that the ultrasound is considered a limited OB ultrasound and is not intended to be a complete ultrasound exam.  Patient also informed that the ultrasound is not being completed with the intent of assessing for fetal or placental anomalies or any pelvic  abnormalities.  Explained that the purpose of today's ultrasound is to assess for  viability.  Patient acknowledges the purpose of the exam and the limitations of the study.     Unable to see fetal pole on transabdominal scan. Will send for formal TVUS  Results for orders placed or performed during the hospital encounter of 01/03/18 (from the past 24 hour(s))  Urinalysis, Routine w reflex microscopic     Status: Abnormal   Collection Time: 01/03/18  5:56 PM  Result Value Ref Range   Color, Urine YELLOW YELLOW   APPearance CLEAR CLEAR   Specific Gravity, Urine 1.008 1.005 - 1.030   pH 6.0 5.0 - 8.0   Glucose, UA NEGATIVE NEGATIVE mg/dL   Hgb urine dipstick LARGE (A) NEGATIVE   Bilirubin Urine NEGATIVE NEGATIVE   Ketones, ur NEGATIVE NEGATIVE mg/dL   Protein, ur NEGATIVE NEGATIVE mg/dL   Nitrite NEGATIVE NEGATIVE   Leukocytes, UA NEGATIVE NEGATIVE   RBC / HPF 0-5 0 - 5 RBC/hpf   WBC, UA 0-5 0 - 5 WBC/hpf   Bacteria, UA RARE (A) NONE SEEN   Squamous Epithelial / LPF 0-5 0 - 5   US Ob Transvaginal  Result Date: 01/03/2018 CLINICAL DATA:  Initial evaluation for vaginal bleeding in early pregnancy. EXAM: OBSTETRIC <14 WK [place] AND TRANSVAGINAL OB US TECHNIQUE: Both transabdominal and transvaginal ultrasound examinations were performed for complete evaluation of the gestation as well as the maternal uterus, adnexal regions, and pelvic cul-de-sac. Transvaginal technique was performed to assess early pregnancy. COMPARISON:  Prior ultrasound from 12/07/2017 FINDINGS: Intrauterine gestational sac: Single Yolk sac:  Not visualized. Embryo:  Present. Cardiac Activity: None detectable. Heart Rate: N/A  bpm CRL: 9.3 mm   6 w   6 d                  [place] EDC: 08/23/2018 Subchorionic hemorrhage:  None visualized. Maternal uterus/adnexae: Ovaries normal in appearance bilaterally. Small corpus luteal cyst noted on the right. IMPRESSION: 1. Single intrauterine pregnancy with crown-rump length measuring 9.3 mm, with  no detectable cardiac activity. Findings meet definitive criteria for failed pregnancy. This follows SRU consensus guidelines: Diagnostic Criteria for Nonviable Pregnancy Early in the First Trimester. [facility] Mis J Med [phone]. 2. No other acute maternal uterine or adnexal abnormality identified. Electronically Signed   By: Rise Mu M.D.   On: 01/03/2018 19:52     MAU Course  Procedures  MDM Patient had rhogam on 12/20/17  Assessment and Plan   1. Missed abortion   2. [redacted] weeks gestation of pregnancy    DC home Comfort measures reviewed  Bleeding precautions RX: cytotec, ibuprofen, zofran and vicodin Return to MAU as needed FU with OB  as planned  Follow-up Information    [facility].   Specialty:  Obstetrics and Gynecology Contact information: [address] [place] [id number] [phone]           Thressa ShellerHeather Grecia Lynk 01/03/2018, 6:17 PM

## 2018-01-22 ENCOUNTER — Encounter: Payer: Self-pay | Admitting: Family Medicine

## 2018-01-22 ENCOUNTER — Ambulatory Visit (INDEPENDENT_AMBULATORY_CARE_PROVIDER_SITE_OTHER): Payer: Self-pay | Admitting: Family Medicine

## 2018-01-22 ENCOUNTER — Other Ambulatory Visit (HOSPITAL_COMMUNITY)
Admission: RE | Admit: 2018-01-22 | Discharge: 2018-01-22 | Disposition: A | Discharge status: Home / Self Care | Payer: Medicaid Other | Source: Ambulatory Visit | Attending: Family Medicine | Admitting: Family Medicine

## 2018-01-22 VITALS — BP 128/73 | HR 90 | Ht 62.0 in | Wt 223.4 lb

## 2018-01-22 DIAGNOSIS — N898 Other specified noninflammatory disorders of vagina: Secondary | ICD-10-CM | POA: Diagnosis not present

## 2018-01-22 DIAGNOSIS — O039 Complete or unspecified spontaneous abortion without complication: Secondary | ICD-10-CM

## 2018-01-22 DIAGNOSIS — N96 Recurrent pregnancy loss: Secondary | ICD-10-CM

## 2018-01-22 NOTE — Progress Notes (Signed)
Pt her for follow up SAB. Pt reports bleeding stopped last Tuesday.  Pt denies cramping or pain.  Pt reports vaginal irritation that started last Wednesday. Pt denies abnormal discharge with some odor for which she took probiotics.  Pt denies odor now, just irritation.

## 2018-01-22 NOTE — Progress Notes (Signed)
   Subjective:    Julia Potter - 27 y.o. female MRN [id number]  Date of birth: [birth year]  HPI  Julia Potter is a 27 y.o. [phone] female here for follow up from SAB. After stopped bleeding last Tuesday, Wednesday had irritation down there. Thursday and Friday started itching. Calmed down, but still noticeable. Has had BV and yeast before. Would like to be check for both today.  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 Would like to get worked up for causes of recurrent SAB.    OB History    Gravida  4   Para  1   Term  0   Preterm  1   AB  3   Living  1     SAB  3   TAB  0   Ectopic  0   Multiple  0   Live Births  1        Obstetric Comments  G1P0101 [facility]H, IOL. Delivered a viable female at 7234 weeks          Health Maintenance:   Health Maintenance Due  Topic Date Due  . PAP-Cervical Cytology Screening  08/12/2013    -  reports that she has never smoked. She has never used smokeless tobacco. - Review of Systems: Per HPI. - Past Medical History: Patient Active Problem List   Diagnosis Date Noted  . BMI 40.0-44.9, adult (HCC) 06/12/2017  . Transient hypertension 05/22/2017  . Abnormal uterine bleeding (AUB) 01/20/2016  . Hirsutism 09/14/2015  . Psychiatric illness 11/01/2013   - Medications: reviewed and updated   Objective:   Physical Exam BP 128/73   Pulse 90   Ht 5\' 2"  (1.575 m)   Wt 223 lb 6.4 oz (101.3 kg)   LMP 09/30/2017   Breastfeeding No   BMI 40.86 kg/m  Gen: NAD, alert, cooperative with exam, well-appearing HEENT: NCAT, PERRL, clear conjunctiva, oropharynx clear, supple neck CV: RRR Resp: non-labored Skin: no rashes, normal turgor  Neuro: no gross deficits.  Psych: good insight, alert and oriented GU/GYN: Exam performed in the presence of a chaperone. External genitalia within normal limits.  Vaginal mucosa pink,  moist, normal rugae.  Nonfriable cervix without lesions, no discharge or bleeding noted on speculum exam.      Assessment & Plan:   1. Spontaneous abortion - resolved based on symptoms - given hx of recurrent pregnancy and loss will make sure Hcg downtrending  - Beta hCG quant (ref lab)  2. Vaginal irritation - Cervicovaginal ancillary only( [name])  3. History of recurrent miscarriages - TSH; Future - HgB A1c; Future - Cardiolipin antibodies, IgG, IgM, IgA; Future - Lupus anticoagulant panel( [facility]); Future - US PELVIC COMPLETE WITH TRANSVAGINAL; Future - follow up after testing   Routine preventative health maintenance measures emphasized. Please refer to After Visit Summary for other counseling recommendations.   Return in about 2 weeks (around 02/05/2018) for Recurrent SAB.  Gwenevere AbbotNimeka Kayleana Waites, MD  OB Fellow  01/22/2018, 8:19 PM

## 2018-01-23 LAB — BETA HCG QUANT (REF LAB): hCG Quant: 8 m[IU]/mL

## 2018-01-25 LAB — CERVICOVAGINAL ANCILLARY ONLY
BACTERIAL VAGINITIS: POSITIVE — AB
CANDIDA VAGINITIS: NEGATIVE
Chlamydia: NEGATIVE
NEISSERIA GONORRHEA: NEGATIVE
Trichomonas: NEGATIVE

## 2018-01-26 ENCOUNTER — Encounter: Payer: Medicaid Other | Admitting: Obstetrics and Gynecology

## 2018-02-06 ENCOUNTER — Ambulatory Visit (INDEPENDENT_AMBULATORY_CARE_PROVIDER_SITE_OTHER): Payer: Medicaid Other | Admitting: Family Medicine

## 2018-02-06 ENCOUNTER — Encounter: Payer: Self-pay | Admitting: Family Medicine

## 2018-02-06 VITALS — BP 151/95 | HR 91 | Wt 229.3 lb

## 2018-02-06 DIAGNOSIS — N96 Recurrent pregnancy loss: Secondary | ICD-10-CM

## 2018-02-06 DIAGNOSIS — N760 Acute vaginitis: Secondary | ICD-10-CM | POA: Diagnosis not present

## 2018-02-06 DIAGNOSIS — B9689 Other specified bacterial agents as the cause of diseases classified elsewhere: Secondary | ICD-10-CM

## 2018-02-06 MED ORDER — FLUCONAZOLE 150 MG PO TABS: 150.0000 mg | ORAL_TABLET | Freq: Once | ORAL | 0 refills | Status: AC

## 2018-02-06 MED ORDER — METRONIDAZOLE 500 MG PO TABS: 500.0000 mg | ORAL_TABLET | Freq: Two times a day (BID) | ORAL | 0 refills | Status: DC

## 2018-02-06 MED ORDER — NIFEDIPINE ER OSMOTIC RELEASE 30 MG PO TB24: 30.0000 mg | ORAL_TABLET | Freq: Every day | ORAL | 3 refills | Status: DC

## 2018-02-06 NOTE — Progress Notes (Deleted)
   Subjective:    Julia Potter - 28 y.o. female MRN [id number]  Date of birth: Feb 23, 1990  HPI  Julia Potter is a 28 y.o. [phone] female here for ***     OB History    Gravida  4   Para  1   Term  0   Preterm  1   AB  3   Living  1     SAB  3   TAB  0   Ectopic  0   Multiple  0   Live Births  1        Obstetric Comments  G1P0101 Admitted to [facility] for PIH, IOL. Delivered a viable female at 34 weeks          Health Maintenance:  Normal pap and negative HRHPV on ***.  Normal mammogram on ***.  Health Maintenance:  - *** There are no preventive care reminders to display for this patient.  -  reports that she has never smoked. She has never used smokeless tobacco. - Review of Systems: Per HPI. - Past Medical History: Patient Active Problem List   Diagnosis Date Noted  . BMI 40.0-44.9, adult (HCC) 06/12/2017  . Transient hypertension 05/22/2017  . Abnormal uterine bleeding (AUB) 01/20/2016  . Hirsutism 09/14/2015  . Psychiatric illness 11/01/2013   - Medications: reviewed and updated   Objective:   Physical Exam BP (!) 151/95   Pulse 91   Wt 229 lb 4.8 oz (104 kg)   BMI 41.94 kg/m  Gen: NAD, alert, cooperative with exam, well-appearing HEENT: NCAT, PERRL, clear conjunctiva, oropharynx clear, supple neck CV: RRR, good S1/S2, no murmur, no edema, capillary refill brisk  Resp: CTABL, no wheezes, non-labored Abd: SNTND, BS present, no guarding or organomegaly Skin: no rashes, normal turgor  Neuro: no gross deficits.  Psych: good insight, alert and oriented GU/GYN: Exam performed in the presence of a chaperone. External genitalia within normal limits.  Vaginal mucosa pink, moist, normal rugae.  Nonfriable cervix without lesions, no discharge or bleeding noted on speculum exam.  Bimanual exam revealed normal, nongravid uterus.  No cervical motion tenderness. No adnexal masses bilaterally.        Assessment & Plan:   There are no diagnoses  linked to this encounter.  Routine preventative health maintenance measures emphasized. Please refer to After Visit Summary for other counseling recommendations.   No follow-ups on file.  Gwenevere Abbot, MD  OB Fellow  02/06/2018, 5:42 PM

## 2018-02-06 NOTE — Progress Notes (Signed)
   Subjective:    Julia Potter - 28 y.o. female MRN [id number]  Date of birth: 01-03-1991  HPI  Julia Potter is a 28 y.o. [phone] female here for follow up. Reports that she's been feeling well. Was taking probiotics and states that vaginal symptoms have improved. Would still like to take metronidazole. Prefers to have diflucan with flagyl since usually has yeast infection after taking flagyl. Concerned about recurrent miscarriage as well. Would like initial labs to see if there are any abnormalities that could be leading to this problem.   OB History    Gravida  4   Para  1   Term  0   Preterm  1   AB  3   Living  1     SAB  3   TAB  0   Ectopic  0   Multiple  0   Live Births  1        Obstetric Comments  G1P0101 Admitted to [facility] for PIH, IOL. Delivered a viable female at 24 weeks          Health Maintenance:  Normal pap and negative HRHPV on 05/23/2017.   There are no preventive care reminders to display for this patient.  -  reports that she has never smoked. She has never used smokeless tobacco. - Review of Systems: Per HPI. - Past Medical History: Patient Active Problem List   Diagnosis Date Noted  . BMI 40.0-44.9, adult (HCC) 06/12/2017  . Transient hypertension 05/22/2017  . Abnormal uterine bleeding (AUB) 01/20/2016  . Hirsutism 09/14/2015  . Psychiatric illness 11/01/2013   - Medications: reviewed and updated   Objective:   Physical Exam BP (!) 151/95   Pulse 91   Wt 229 lb 4.8 oz (104 kg)   BMI 41.94 kg/m  Gen: NAD, alert, cooperative with exam, well-appearing HEENT: NCAT, PERRL, clear conjunctiva CV: RRR Resp:  non-labored Skin: no rashes, normal turgor  Neuro: no gross deficits.  Psych: good insight, alert and oriented GU/GYN: deferred    Assessment & Plan:   1. History of recurrent miscarriages - will schedule TVUS to look for PCOS or anatomic abnormalities that could be contributing to problem - Lupus anticoagulant  panel( [facility]) - Cardiolipin antibodies, IgG, IgM, IgA - HgB A1c - TSH  2. Bacterial vaginosis - metronidazole and diflucan - discussed prevention of recurrent BV  Routine preventative health maintenance measures emphasized. Please refer to After Visit Summary for other counseling recommendations.   Return if symptoms worsen or fail to improve.  Gwenevere Abbot, MD  OB Fellow  02/06/2018, 8:44 PM

## 2018-02-08 LAB — LUPUS ANTICOAGULANT PANEL
Dilute Viper Venom Time: 29.7 s (ref 0.0–47.0)
PTT Lupus Anticoagulant: 44.8 s (ref 0.0–51.9)

## 2018-02-08 LAB — CARDIOLIPIN ANTIBODIES, IGG, IGM, IGA
Anticardiolipin IgA: <9 APL U/mL (ref 0–11)
Anticardiolipin IgG: <9 GPL U/mL (ref 0–14)
Anticardiolipin IgM: <9 MPL U/mL (ref 0–12)

## 2018-02-08 LAB — HEMOGLOBIN A1C
Est. average glucose Bld gHb Est-mCnc: 100 mg/dL
Hgb A1c MFr Bld: 5.1 % (ref 4.8–5.6)

## 2018-02-08 LAB — TSH: TSH: 0.67 u[IU]/mL (ref 0.450–4.500)

## 2018-02-09 ENCOUNTER — Telehealth: Payer: Self-pay

## 2018-02-09 NOTE — Telephone Encounter (Signed)
Called pt to advise of US appt on 02/14/17 @ 9:30a, plze arrive @ 9:15 w/ full bladder.Pt verbalized understanding.

## 2018-02-09 NOTE — Telephone Encounter (Signed)
-----   Message from Gwenevere AbbotNimeka Phillip, MD sent at 02/09/2018 10:48 AM EST ----- Regarding: Please schedule US Please schedule US for this patient's history of recurrent miscarriage. Thanks!

## 2018-02-14 ENCOUNTER — Ambulatory Visit (HOSPITAL_COMMUNITY)
Admission: RE | Admit: 2018-02-14 | Discharge: 2018-02-14 | Disposition: A | Discharge status: Home / Self Care | Payer: Medicaid Other | Source: Ambulatory Visit | Attending: Family Medicine | Admitting: Family Medicine

## 2018-02-14 DIAGNOSIS — N96 Recurrent pregnancy loss: Secondary | ICD-10-CM | POA: Diagnosis not present

## 2018-02-16 ENCOUNTER — Encounter: Payer: Medicaid Other | Admitting: Advanced Practice Midwife

## 2018-02-19 ENCOUNTER — Telehealth: Payer: Self-pay | Admitting: *Deleted

## 2018-02-19 NOTE — Telephone Encounter (Signed)
Julia Potter called today and left a message about her test results.

## 2018-02-20 NOTE — Telephone Encounter (Addendum)
I called Julia Potter and she wanted to know lab results and [place] that was ordered from her last visit on 02/06/18.  I reviewed results which were all normal and reinforced that if she continues to have problems to make another appointment. She voices understanding.

## 2018-03-12 ENCOUNTER — Other Ambulatory Visit: Payer: Self-pay | Admitting: Family Medicine

## 2018-03-12 ENCOUNTER — Other Ambulatory Visit: Payer: Self-pay

## 2018-03-16 ENCOUNTER — Other Ambulatory Visit: Payer: Self-pay

## 2018-05-14 ENCOUNTER — Other Ambulatory Visit: Payer: Self-pay

## 2018-05-14 MED ORDER — VALACYCLOVIR HCL 1 G PO TABS: 500.0000 mg | ORAL_TABLET | Freq: Two times a day (BID) | ORAL | 0 refills | Status: DC

## 2018-05-29 ENCOUNTER — Other Ambulatory Visit: Payer: Self-pay | Admitting: Family Medicine

## 2018-06-26 ENCOUNTER — Encounter: Payer: Self-pay | Admitting: Family Medicine

## 2018-06-27 ENCOUNTER — Ambulatory Visit: Payer: Medicaid Other | Admitting: Family Medicine

## 2018-06-27 ENCOUNTER — Telehealth: Payer: Self-pay | Admitting: Lactation Services

## 2018-06-27 ENCOUNTER — Other Ambulatory Visit: Payer: Self-pay | Admitting: Family Medicine

## 2018-06-27 MED ORDER — FLUCONAZOLE 150 MG PO TABS: ORAL_TABLET | ORAL | 0 refills | Status: DC

## 2018-06-27 MED ORDER — METRONIDAZOLE 500 MG PO TABS: 500.0000 mg | ORAL_TABLET | Freq: Two times a day (BID) | ORAL | 0 refills | Status: DC

## 2018-06-27 NOTE — Telephone Encounter (Signed)
Pt called and left a message on nurse voice mail.   Pt reports she has symptoms of BV and would like to have medication called in. She does not wish to come into the office since she has had BV previously.   Pt has also reached out to her [facility] Medicine Provider Lezlie Octave and was prescribed medication today.

## 2018-06-30 ENCOUNTER — Encounter (HOSPITAL_COMMUNITY): Payer: Self-pay | Admitting: Family Medicine

## 2018-06-30 ENCOUNTER — Other Ambulatory Visit: Payer: Self-pay

## 2018-06-30 ENCOUNTER — Ambulatory Visit (HOSPITAL_COMMUNITY)
Admission: EM | Admit: 2018-06-30 | Discharge: 2018-06-30 | Disposition: A | Discharge status: Home / Self Care | Payer: Medicaid Other | Attending: Family Medicine | Admitting: Family Medicine

## 2018-06-30 DIAGNOSIS — R102 Pelvic and perineal pain: Secondary | ICD-10-CM

## 2018-06-30 LAB — POCT URINALYSIS DIP (DEVICE)
Bilirubin Urine: NEGATIVE
Glucose, UA: NEGATIVE mg/dL
Hgb urine dipstick: NEGATIVE
Ketones, ur: NEGATIVE mg/dL
Leukocytes,Ua: NEGATIVE
Nitrite: NEGATIVE
Protein, ur: NEGATIVE mg/dL
Specific Gravity, Urine: >=1.03 (ref 1.005–1.030)
Urobilinogen, UA: 0.2 mg/dL (ref 0.0–1.0)
pH: 7 (ref 5.0–8.0)

## 2018-06-30 LAB — POCT PREGNANCY, URINE: Preg Test, Ur: NEGATIVE

## 2018-06-30 MED ORDER — AZITHROMYCIN 250 MG PO TABS
ORAL_TABLET | ORAL | Status: AC
  Filled 2018-06-30: qty 4

## 2018-06-30 MED ORDER — AZITHROMYCIN 250 MG PO TABS
1000.0000 mg | ORAL_TABLET | Freq: Once | ORAL | Status: AC
  Administered 2018-06-30: 17:00:00 1000 mg via ORAL

## 2018-06-30 MED ORDER — CEFTRIAXONE SODIUM 250 MG IJ SOLR
INTRAMUSCULAR | Status: AC
  Filled 2018-06-30: qty 250

## 2018-06-30 MED ORDER — LIDOCAINE HCL (PF) 1 % IJ SOLN
INTRAMUSCULAR | Status: AC
  Filled 2018-06-30: qty 2

## 2018-06-30 MED ORDER — CEFTRIAXONE SODIUM 250 MG IJ SOLR
250.0000 mg | Freq: Once | INTRAMUSCULAR | Status: AC
  Administered 2018-06-30: 17:00:00 250 mg via INTRAMUSCULAR

## 2018-06-30 NOTE — ED Provider Notes (Signed)
[facility]    CSN: [id number] Arrival date & time: 06/30/18  1529     History   Chief Complaint Chief Complaint  Patient presents with  . Abdominal Pain    HPI Julia Potter is a 28 y.o. female.   28 yo established patient presents with lower abdominal pain.  G4P0 with 4 spontaneous Ab's.  LMP was 3 weeks ago.    Patient has been on diflucan and metronidazole for several days with no improvement.  No new partner.  No discharge or vaginal bleeding.   No fever, diarrhea, nausea, vomiting.  Pain is midline suprapubic and described as crampy, daily.     Past Medical History:  Diagnosis Date  . ALLERGIC RHINITIS   . Anxiety    no meds  . Chlamydia   . Depression    doing ok, in therapy - no meds  . Herpes   . HSV infection   . Pregnancy induced hypertension 2010   resolved after pregnancy  . Rh negative state in antepartum period 05/10/2017   [ ] Rhophylac  . Seasonal allergies     Patient Active Problem List   Diagnosis Date Noted  . BMI 40.0-44.9, adult (HCC) 06/12/2017  . Transient hypertension 05/22/2017  . Abnormal uterine bleeding (AUB) 01/20/2016  . Hirsutism 09/14/2015  . Psychiatric illness 11/01/2013    Past Surgical History:  Procedure Laterality Date  . DILATION AND EVACUATION N/A 05/23/2017   Procedure: DILATATION AND EVACUATION;  Surgeon: Delmar BingPickens, Charlie, MD;  Location: [facility];  Service: Gynecology;  Laterality: N/A;  needs US  . INTRAUTERINE DEVICE INSERTION  09/2010  . IUD REMOVAL      OB History    Gravida  4   Para  1   Term  0   Preterm  1   AB  3   Living  1     SAB  3   TAB  0   Ectopic  0   Multiple  0   Live Births  1        Obstetric Comments  G1P0101 [facility] I[facility]. Delivered a viable female at 2734 weeks         Home Medications    Prior to Admission medications   Medication Sig Start Date End Date Taking? Authorizing Provider  fluconazole (DIFLUCAN) 150 MG tablet Take one  pill at the start of your symptoms and the second pill 72 hours later if symptoms persist. 06/27/18  Yes Winfrey, Harlen LabsAmanda C, MD  metroNIDAZOLE (FLAGYL) 500 MG tablet Take 1 tablet (500 mg total) by mouth 2 (two) times daily. 06/27/18  Yes Winfrey, Harlen LabsAmanda C, MD  NIFEdipine (PROCARDIA-XL/NIFEDICAL-XL) 30 MG 24 hr tablet TAKE ONE TABLET BY MOUTH DAILY 05/29/18  Yes Gwenevere AbbotPhillip, Nimeka, MD  diphenhydrAMINE (BENADRYL) 25 MG tablet Take 25 mg by mouth daily as needed for allergies.    [provider]  valACYclovir (VALTREX) 1000 MG tablet Take 0.5 tablets (500 mg total) by mouth 2 (two) times daily. 05/14/18   Lennox SoldersWinfrey, Amanda C, MD  medroxyPROGESTERone (DEPO-PROVERA) 150 MG/ML injection Inject 150 mg into the muscle every 3 (three) months.    04/13/11  [provider]    Family History Family History  Problem Relation Age of Onset  . Diabetes Maternal Grandmother   . Asthma Daughter     Social History Social History   Tobacco Use  . Smoking status: Never Smoker  . Smokeless tobacco: Never Used  Substance Use Topics  .  Alcohol use: Not Currently  . Drug use: No     Allergies   Patient has no known allergies.   Review of Systems Review of Systems  Gastrointestinal: Positive for abdominal pain.  All other systems reviewed and are negative.    Physical Exam Triage Vital Signs ED Triage Vitals  Enc Vitals Group     BP      Pulse      Resp      Temp      Temp src      SpO2      Weight      Height      Head Circumference      Peak Flow      Pain Score      Pain Loc      Pain Edu?      Excl. in GC?    No data found.  Updated Vital Signs BP (!) 143/80   Pulse (!) 117   Temp 99 F (37.2 C)   Resp 18   LMP 05/14/2018   SpO2 100%    Physical Exam Vitals signs and nursing note reviewed.  Constitutional:      Appearance: She is well-developed. She is obese.  HENT:     Mouth/Throat:     Mouth: Mucous membranes are moist.  Eyes:     Extraocular  Movements: Extraocular movements intact.  Cardiovascular:     Rate and Rhythm: Normal rate.     Heart sounds: Normal heart sounds.  Pulmonary:     Effort: Pulmonary effort is normal.     Breath sounds: Normal breath sounds.  Abdominal:     General: Abdomen is protuberant. Bowel sounds are normal.     Palpations: Abdomen is soft.     Tenderness: There is abdominal tenderness. There is no guarding or rebound.  Skin:    General: Skin is warm and dry.  Neurological:     General: No focal deficit present.     Mental Status: She is alert.  Psychiatric:        Mood and Affect: Mood normal.        Behavior: Behavior normal.      UC Treatments / Results  Labs (all labs ordered are listed, but only abnormal results are displayed) Labs Reviewed  POC URINE PREG, ED  POCT URINALYSIS DIP (DEVICE)  POCT PREGNANCY, URINE  CERVICOVAGINAL ANCILLARY ONLY    EKG None  Radiology No results found.  Procedures Procedures (including critical care time)  Medications Ordered in UC Medications  cefTRIAXone (ROCEPHIN) injection 250 mg (has no administration in time range)  azithromycin (ZITHROMAX) tablet 1,000 mg (has no administration in time range)    Initial Impression / Assessment and Plan / UC Course  I have reviewed the triage vital signs and the nursing notes.  Pertinent labs & imaging results that were available during my care of the patient were reviewed by me and considered in my medical decision making (see chart for details).    Final Clinical Impressions(s) / UC Diagnoses   Final diagnoses:  Pelvic pain in female     Discharge Instructions     The initial urine tests are negative for infection or pregnancy.  The STD tests are pending  Your symptoms suggest a mild infection inside the uterus.  We are giving you antibiotics which should solve the problem.  If symptoms continue, consult your primary care provider or go to the emergency department.    ED  Prescriptions  None     Controlled Substance Prescriptions [place] Controlled Substance Registry consulted? Not Applicable   Elvina Sidle, MD 06/30/18 [phone]

## 2018-06-30 NOTE — ED Triage Notes (Signed)
Pt states her abdominal cramps are increasing , onset 3 weeks ago

## 2018-06-30 NOTE — Discharge Instructions (Addendum)
The initial urine tests are negative for infection or pregnancy.  The STD tests are pending  Your symptoms suggest a mild infection inside the uterus.  We are giving you antibiotics which should solve the problem.  If symptoms continue, consult your primary care provider or go to the emergency department.

## 2018-07-02 LAB — CERVICOVAGINAL ANCILLARY ONLY
Chlamydia: NEGATIVE
Neisseria Gonorrhea: NEGATIVE
Trichomonas: NEGATIVE

## 2018-08-23 ENCOUNTER — Ambulatory Visit (HOSPITAL_COMMUNITY)
Admission: EM | Admit: 2018-08-23 | Discharge: 2018-08-23 | Disposition: A | Discharge status: Home / Self Care | Payer: Medicaid Other | Attending: Emergency Medicine | Admitting: Emergency Medicine

## 2018-10-31 ENCOUNTER — Encounter: Payer: Self-pay | Admitting: Family Medicine

## 2018-11-01 ENCOUNTER — Other Ambulatory Visit: Payer: Self-pay | Admitting: Family Medicine

## 2018-11-01 MED ORDER — METFORMIN HCL ER 500 MG PO TB24: 500.0000 mg | ORAL_TABLET | Freq: Every day | ORAL | 3 refills | Status: DC

## 2019-01-08 IMAGING — US US OB TRANSVAGINAL
1 series · 15 of 28 positions shown · non-contrast
Comparison: 04/25/2017

CLINICAL DATA: Followup triplet pregnancy. Inconclusive fetal
viability.

EXAM:
TRANSVAGINAL OB US

[Series 1: us ob transvaginal · 40 acquisitions, 15 frames shown]
[im 1/40]
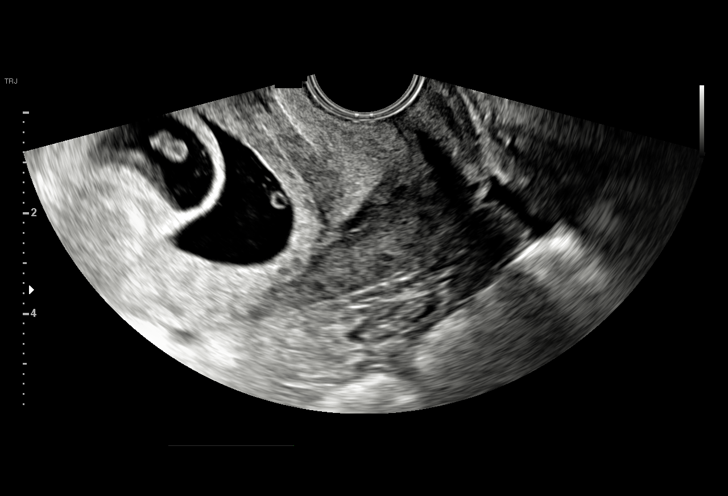
[im 3/40]
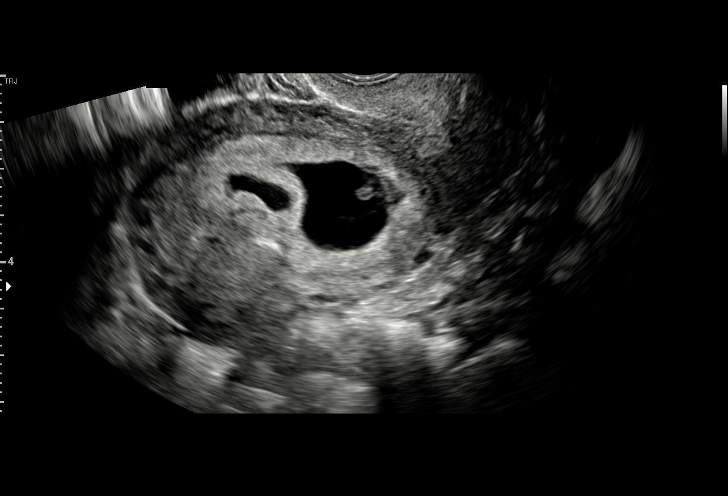
[im 6/40]
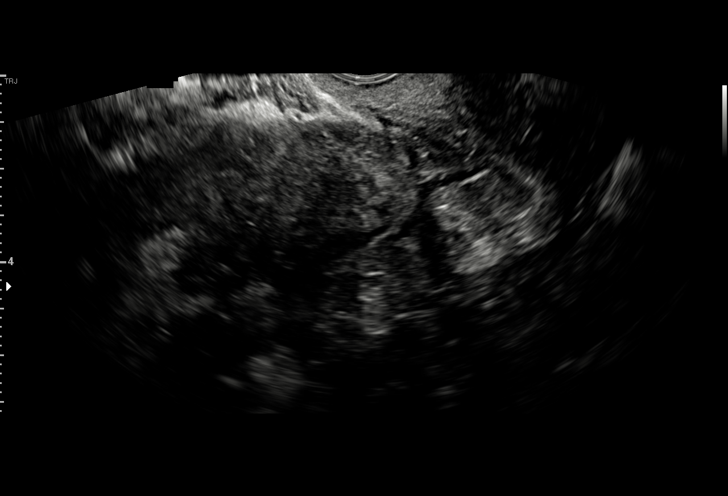
[im 9/40]
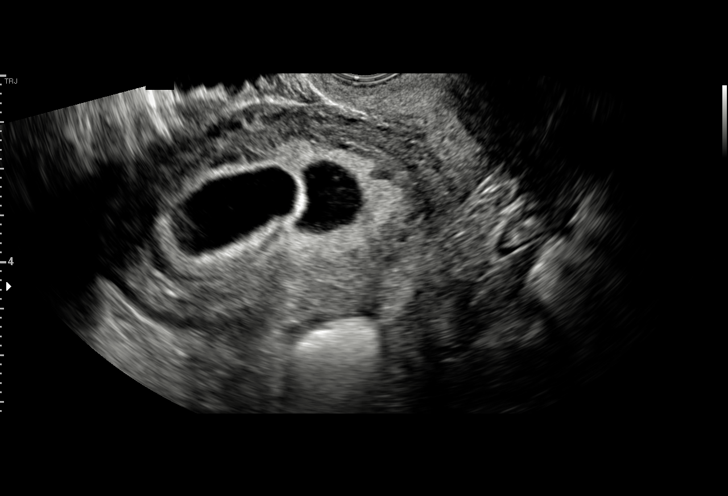
[im 12/40]
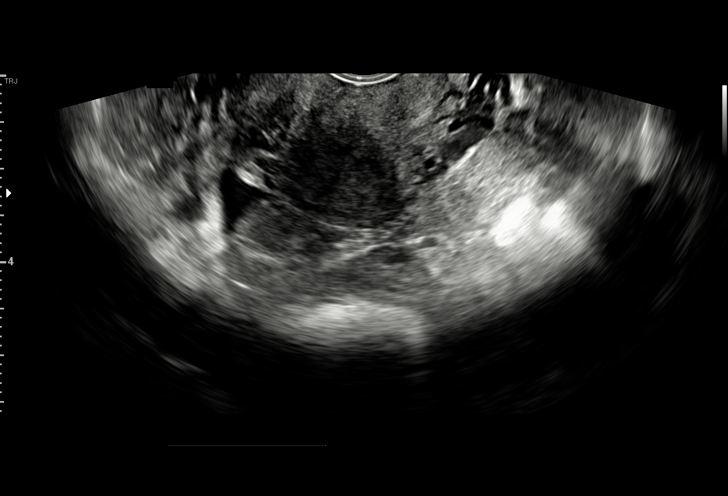
[im 15/40]
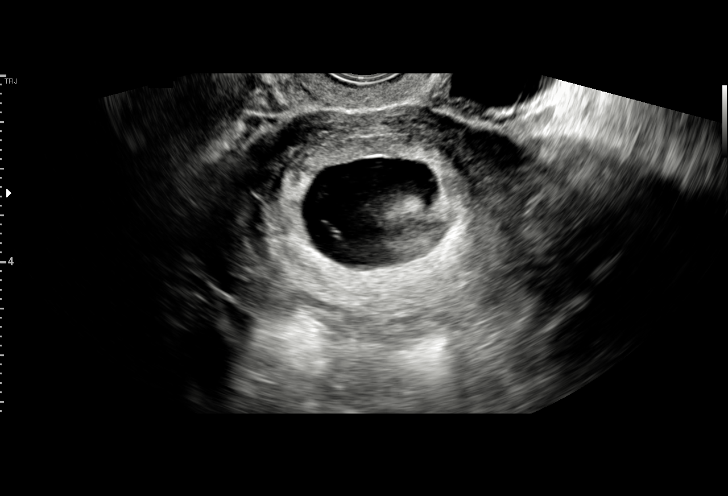
[im 18/40]
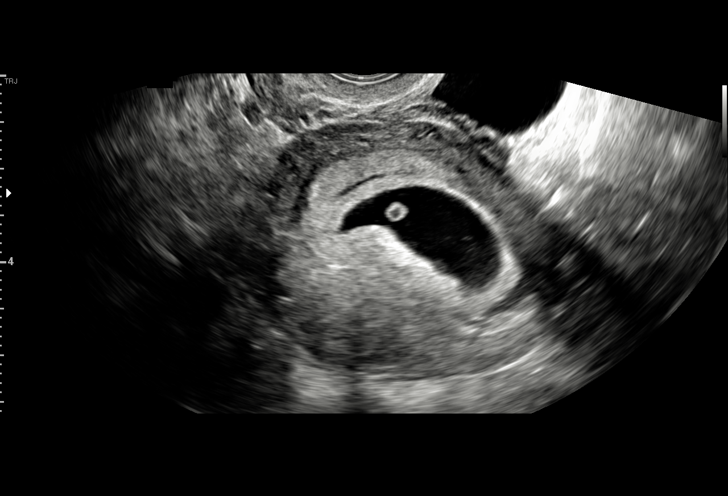
[im 21/40]
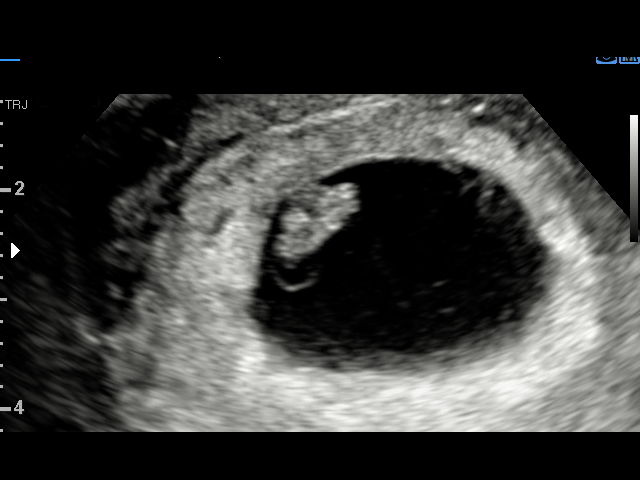
[im 22/40]
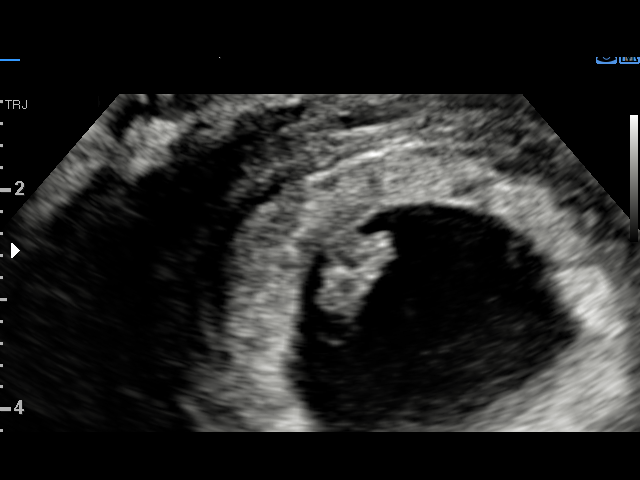
[im 25/40]
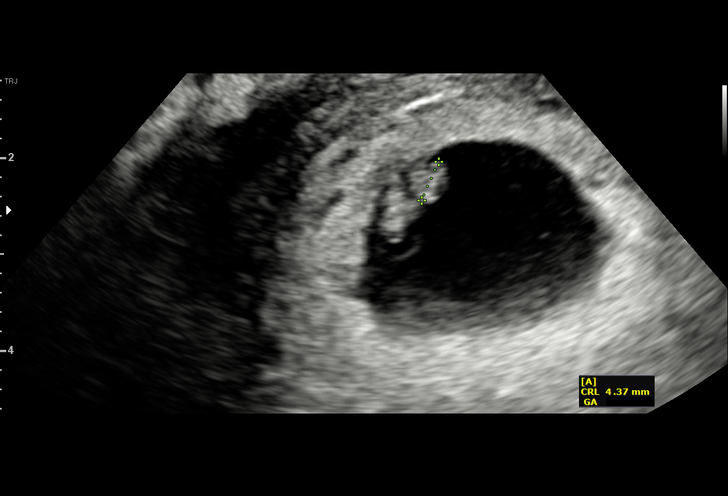
[im 28/40]
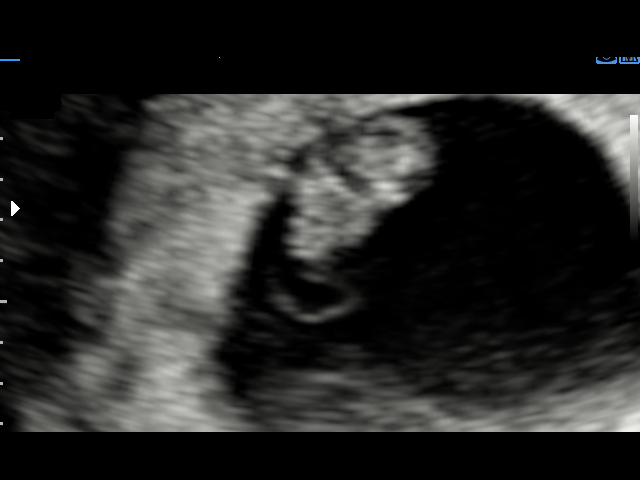
[im 31/40]
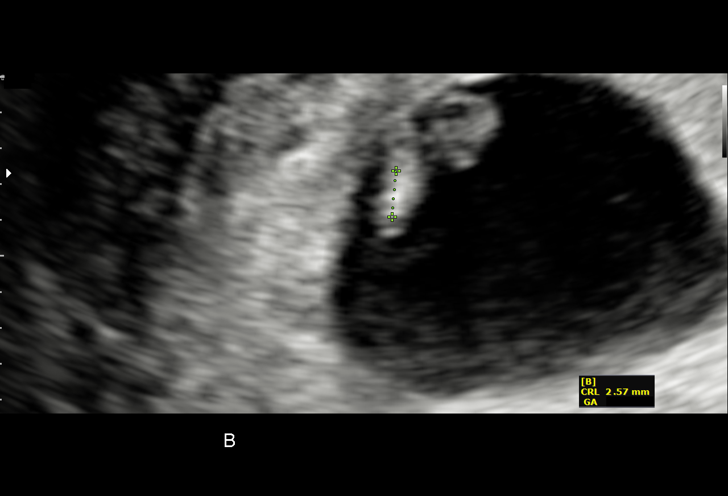
[im 34/40]
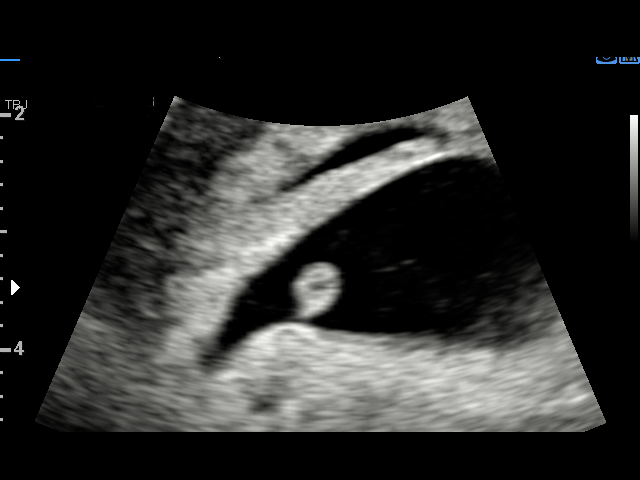
[im 37/40]
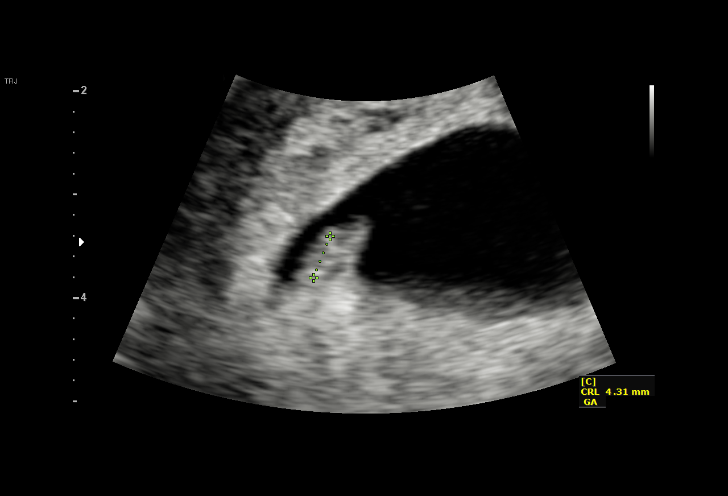
[im 40/40]
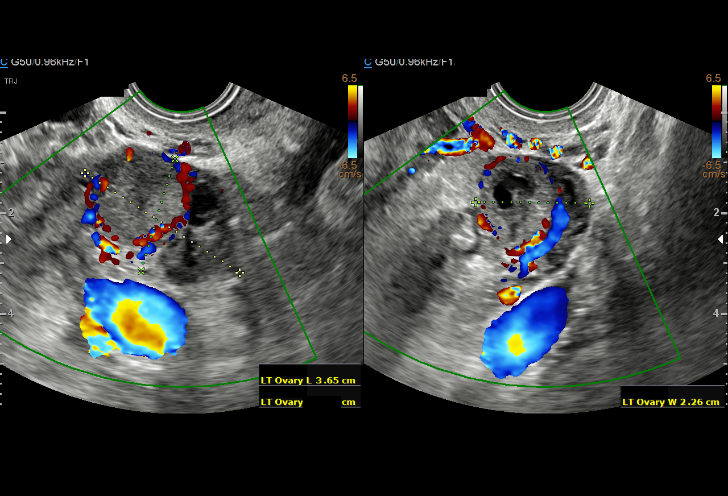

[15 of 28 positions shown; findings below may reference images not displayed]

FINDINGS: Number of IUPs:  3

Triplet 1 and 2 are monochorionic, and probably diamniotic. Triplet
3 has a separate thick chorion.

TRIPLET 1

Yolk sac:  Visualized.

Embryo:  Visualized.

Cardiac Activity: Visualized.

Heart Rate: 80 bpm

CRL: 5 mm   6w 1d   US EDC: 12/24/2017

TRIPLET 2

Yolk sac:  Visualized.

Embryo:  Visualized.

Cardiac Activity: Not Visualized.

CRL: 3 mm   5w 5d    US EDC: 12/27/2017

TRIPLET 3

Yolk sac:  Visualized.

Embryo:  Visualized.

Cardiac Activity: Not Visualized.

CRL: 5 mm   6w 1d    US EDC: 12/24/2017

Subchorionic hemorrhage:  None visualized.

Maternal uterus/adnexae:

Both ovaries are normal in appearance. No mass or abnormal free
fluid identified.
IMPRESSION: Triplet IUP shows less than expected interval progression since
prior exam. Triplet 1 has slow heart rate, and triplet 2 and 3 show
no definite cardiac activity. Findings are suspicious, but not yet
definitive for, failed IUP. Recommend follow-up US in 7 days for
definitive diagnosis. This recommendation follows SRU consensus
guidelines: Diagnostic Criteria for Nonviable Pregnancy Early in the
First Trimester. N Engl J Med 8895; [DATE].

## 2019-01-28 ENCOUNTER — Other Ambulatory Visit: Payer: Self-pay

## 2019-01-28 MED ORDER — VALACYCLOVIR HCL 1 G PO TABS: 500.0000 mg | ORAL_TABLET | Freq: Two times a day (BID) | ORAL | 0 refills | Status: DC

## 2019-02-01 NOTE — L&D Delivery Note (Signed)
Operative Delivery Note At 9:08 AM a viable female was delivered via Vaginal, Vacuum Investment banker, operational).  Presentation: vertex; Position: Occiput,, Anterior; Station: +3.  Verbal consent: obtained from patient.  Risks and benefits discussed in detail.  Risks include, but are not limited to the risks of anesthesia, bleeding, infection, damage to maternal tissues, fetal cephalhematoma.  There is also the risk of inability to effect vaginal delivery of the head, or shoulder dystocia that cannot be resolved by established maneuvers, leading to the need for emergency cesarean section.   Indication for operative vaginal delivery: prolonged terminal fetal bradycardia  Patient was examined and found to be fully dilated with fetal station of +3. The soft vacuum soft cup was positioned over the sagittal suture 3 cm anterior to posterior fontanelle.  Pressure was then increased to 500 mmHg, and the patient was instructed to push.  Pulling was administered along the pelvic curve. A single pull was administered during 1 push, 0 popoffs.  The infant was then delivered atraumatically, noted to be a viable female/female infant, Apgars of 9 and 9, weight was pending skin to skin.  There was spontaneous placental delivery, intact with three-vessel cord.  Intact perineum. EBL 300cc, followed by continued bleeding a clots to make a total of 485cc, epidural anesthesia. cytotec PR given, followed by 1g IV TXA given for continued trickling of bleeding per vagina.   Sponge, instrument and needle counts were correct x2.  The patient and baby were stable after delivery and remained in couplet care.  APGAR: 9, 9; weight pending.   Placenta status: Spontaneously delivered, sent to pathology due to severe preeclampsia, preterm.   Cord: 3 vessel with the following complications: NONE .  Cord pH: Not sent, baby immediately crying  Anesthesia:  CSE Instruments: Kiwi vacuum Episiotomy: None Lacerations: None Suture Repair: N/A Est.  Blood Loss (mL): 300 followed by 185cc of clots after delivery  Mom to postpartum.  Baby to Couplet care / Skin to Skin.  Jen Mow, DO [facility] for [employer], [facility] 10/10/2019, 9:45 AM

## 2019-03-05 ENCOUNTER — Inpatient Hospital Stay (HOSPITAL_COMMUNITY): Payer: Medicaid Other

## 2019-03-05 ENCOUNTER — Inpatient Hospital Stay (HOSPITAL_COMMUNITY)
Admission: AD | Admit: 2019-03-05 | Discharge: 2019-03-05 | Disposition: A | Discharge status: Home / Self Care | Payer: Medicaid Other | Attending: Obstetrics and Gynecology | Admitting: Obstetrics and Gynecology

## 2019-03-05 ENCOUNTER — Other Ambulatory Visit: Payer: Self-pay

## 2019-03-05 ENCOUNTER — Encounter (HOSPITAL_COMMUNITY): Payer: Self-pay | Admitting: Obstetrics and Gynecology

## 2019-03-05 DIAGNOSIS — R109 Unspecified abdominal pain: Secondary | ICD-10-CM | POA: Insufficient documentation

## 2019-03-05 DIAGNOSIS — O3680X0 Pregnancy with inconclusive fetal viability, not applicable or unspecified: Secondary | ICD-10-CM

## 2019-03-05 DIAGNOSIS — Z3A01 Less than 8 weeks gestation of pregnancy: Secondary | ICD-10-CM | POA: Diagnosis not present

## 2019-03-05 DIAGNOSIS — O26891 Other specified pregnancy related conditions, first trimester: Secondary | ICD-10-CM | POA: Insufficient documentation

## 2019-03-05 LAB — URINALYSIS, ROUTINE W REFLEX MICROSCOPIC
Bilirubin Urine: NEGATIVE
Glucose, UA: NEGATIVE mg/dL
Hgb urine dipstick: NEGATIVE
Ketones, ur: NEGATIVE mg/dL
Leukocytes,Ua: NEGATIVE
Nitrite: NEGATIVE
Protein, ur: NEGATIVE mg/dL
Specific Gravity, Urine: 1.016 (ref 1.005–1.030)
pH: 7 (ref 5.0–8.0)

## 2019-03-05 LAB — CBC
HCT: 40 % (ref 36.0–46.0)
Hemoglobin: 13.2 g/dL (ref 12.0–15.0)
MCH: 30.8 pg (ref 26.0–34.0)
MCHC: 33 g/dL (ref 30.0–36.0)
MCV: 93.2 fL (ref 80.0–100.0)
Platelets: 410 10*3/uL — ABNORMAL HIGH (ref 150–400)
RBC: 4.29 MIL/uL (ref 3.87–5.11)
RDW: 13.2 % (ref 11.5–15.5)
WBC: 7.8 10*3/uL (ref 4.0–10.5)
nRBC: 0 % (ref 0.0–0.2)

## 2019-03-05 LAB — POCT PREGNANCY, URINE: Preg Test, Ur: POSITIVE — AB

## 2019-03-05 LAB — HCG, QUANTITATIVE, PREGNANCY: hCG, Beta Chain, Quant, S: 8558 m[IU]/mL — ABNORMAL HIGH (ref <5)

## 2019-03-05 LAB — WET PREP, GENITAL
Clue Cells Wet Prep HPF POC: NONE SEEN
Sperm: NONE SEEN
Trich, Wet Prep: NONE SEEN
Yeast Wet Prep HPF POC: NONE SEEN

## 2019-03-05 NOTE — MAU Provider Note (Signed)
Chief Complaint: Abdominal Pain   First Provider Initiated Contact with Patient 03/05/19 1333     SUBJECTIVE HPI: Julia Potter is a 29 y.o. [id number] at [redacted]w[redacted]d who presents to Maternity Admissions reporting abdominal pain. Symptoms started last week. Reports intermittent pain in her mid lower abdomen. Denies n/v/d, constipation, dysuria, vaginal bleeding, or vaginal discharge. LMP was 01/31/19.  Location: abdomen Quality: cramping Severity: 4/10 on pain scale Duration: 1 week Timing: intermittent Modifying factors: nothing makes worse. Hasn't treated symptoms Associated signs and symptoms: none  Past Medical History:  Diagnosis Date  . ALLERGIC RHINITIS   . Anxiety    no meds  . Chlamydia   . Depression    doing ok, in therapy - no meds  . Herpes   . HSV infection   . Pregnancy induced hypertension 2010   resolved after pregnancy  . Rh negative state in antepartum period 05/10/2017   [ ] Rhophylac  . Seasonal allergies    OB History  Gravida Para Term Preterm AB Living  5 1 0 1 3 1   SAB TAB Ectopic Multiple Live Births  3 0 0 0 1    # Outcome Date GA Lbr Len/2nd Weight Sex Delivery Anes PTL Lv  5 Current           4 SAB 05/2017 [redacted]w[redacted]d    SAB        Complications: Missed ab, Triplets, all stillborn, S/P D&C (status post dilation and curettage)  3 SAB 2017             Complications: Missed abortion  2 Preterm 2011 [redacted]w[redacted]d    Vag-Spont     1 SAB             Obstetric Comments  G1P0101 Admitted to [facility] for [place], IOL. Delivered a viable female at 34 weeks   Past Surgical History:  Procedure Laterality Date  . DILATION AND EVACUATION N/A 05/23/2017   Procedure: DILATATION AND EVACUATION;  Surgeon: Aletha Halim, MD;  Location: [facility];  Service: Gynecology;  Laterality: N/A;  needs [place]  . INTRAUTERINE DEVICE INSERTION  09/2010  . IUD REMOVAL     Social History   Socioeconomic History  . Marital status: Single    Spouse name: Not on file  . Number of children: Not on  file  . Years of education: Not on file  . Highest education level: Not on file  Occupational History  . Not on file  Tobacco Use  . Smoking status: Never Smoker  . Smokeless tobacco: Never Used  Substance and Sexual Activity  . Alcohol use: Not Currently  . Drug use: No  . Sexual activity: Yes    Birth control/protection: None  Other Topics Concern  . Not on file  Social History Narrative   Lives with mom, sister and brother   Has one daughter born in [address]   Wants to go to law school   Social Determinants of Health   Financial Resource Strain:   . Difficulty of Paying Living Expenses: Not on file  Food Insecurity:   . Worried About Charity fundraiser in the Last Year: Not on file  . Ran Out of Food in the Last Year: Not on file  Transportation Needs:   . Lack of Transportation (Medical): Not on file  . Lack of Transportation (Non-Medical): Not on file  Physical Activity:   . Days of Exercise per Week: Not on file  . Minutes of Exercise per Session:  Not on file  Stress:   . Feeling of Stress : Not on file  Social Connections:   . Frequency of Communication with Friends and Family: Not on file  . Frequency of Social Gatherings with Friends and Family: Not on file  . Attends Religious Services: Not on file  . Active Member of Clubs or Organizations: Not on file  . Attends Banker Meetings: Not on file  . Marital Status: Not on file  Intimate Partner Violence:   . Fear of Current or Ex-Partner: Not on file  . Emotionally Abused: Not on file  . Physically Abused: Not on file  . Sexually Abused: Not on file   Family History  Problem Relation Age of Onset  . Diabetes Maternal Grandmother   . Asthma Daughter    No current facility-administered medications on file prior to encounter.   Current Outpatient Medications on File Prior to Encounter  Medication Sig Dispense Refill  . diphenhydrAMINE (BENADRYL) 25 MG tablet Take 25 mg by mouth  daily as needed for allergies.    . fluconazole (DIFLUCAN) 150 MG tablet Take one pill at the start of your symptoms and the second pill 72 hours later if symptoms persist. 2 tablet 0  . metFORMIN (GLUCOPHAGE-XR) 500 MG 24 hr tablet Take 1 tablet (500 mg total) by mouth daily with breakfast. 90 tablet 3  . metroNIDAZOLE (FLAGYL) 500 MG tablet Take 1 tablet (500 mg total) by mouth 2 (two) times daily. 14 tablet 0  . NIFEdipine (PROCARDIA-XL/NIFEDICAL-XL) 30 MG 24 hr tablet TAKE ONE TABLET BY MOUTH DAILY 30 tablet 2  . valACYclovir (VALTREX) 1000 MG tablet Take 0.5 tablets (500 mg total) by mouth 2 (two) times daily. 20 tablet 0  . [DISCONTINUED] medroxyPROGESTERone (DEPO-PROVERA) 150 MG/ML injection Inject 150 mg into the muscle every 3 (three) months.       No Known Allergies  I have reviewed patient's Past Medical Hx, Surgical Hx, Family Hx, Social Hx, medications and allergies.   Review of Systems  Constitutional: Negative.   Gastrointestinal: Positive for abdominal pain. Negative for constipation, diarrhea, nausea and vomiting.  Genitourinary: Negative.     OBJECTIVE Patient Vitals for the past 24 hrs:  BP Temp Temp src Pulse Resp SpO2 Height Weight  03/05/19 1321 126/74 -- -- 95 -- -- -- --  03/05/19 1234 135/82 98.4 F (36.9 C) Oral (!) 109 20 100 % 5\' 2"  (1.575 m) 90.3 kg   Constitutional: Well-developed, well-nourished female in no acute distress.  Cardiovascular: normal rate & rhythm, no murmur Respiratory: normal rate and effort. Lung sounds clear throughout GI: Abd soft, non-tender, Pos BS x 4. No guarding or rebound tenderness MS: Extremities nontender, no edema, normal ROM Neurologic: Alert and oriented x 4.    LAB RESULTS Results for orders placed or performed during the hospital encounter of 03/05/19 (from the past 24 hour(s))  Pregnancy, urine POC     Status: Abnormal   Collection Time: 03/05/19 12:42 PM  Result Value Ref Range   Preg Test, Ur POSITIVE (A)  NEGATIVE  CBC     Status: Abnormal   Collection Time: 03/05/19  1:44 PM  Result Value Ref Range   WBC 7.8 4.0 - 10.5 K/uL   RBC 4.29 3.87 - 5.11 MIL/uL   Hemoglobin 13.2 12.0 - 15.0 g/dL   HCT 05/03/19 02.6 - 37.8 %   MCV 93.2 80.0 - 100.0 fL   MCH 30.8 26.0 - 34.0 pg   MCHC 33.0 30.0 -  36.0 g/dL   RDW 08.6 76.1 - 95.0 %   Platelets 410 (H) 150 - 400 K/uL   nRBC 0.0 0.0 - 0.2 %  hCG, quantitative, pregnancy     Status: Abnormal   Collection Time: 03/05/19  1:44 PM  Result Value Ref Range   hCG, Beta Chain, Quant, S 8,558 (H) <5 mIU/mL  Wet prep, genital     Status: Abnormal   Collection Time: 03/05/19  1:54 PM   Specimen: Vaginal  Result Value Ref Range   Yeast Wet Prep HPF POC NONE SEEN NONE SEEN   Trich, Wet Prep NONE SEEN NONE SEEN   Clue Cells Wet Prep HPF POC NONE SEEN NONE SEEN   WBC, Wet Prep HPF POC FEW (A) NONE SEEN   Sperm NONE SEEN   Urinalysis, Routine w reflex microscopic     Status: None   Collection Time: 03/05/19  1:58 PM  Result Value Ref Range   Color, Urine YELLOW YELLOW   APPearance CLEAR CLEAR   Specific Gravity, Urine 1.016 1.005 - 1.030   pH 7.0 5.0 - 8.0   Glucose, UA NEGATIVE NEGATIVE mg/dL   Hgb urine dipstick NEGATIVE NEGATIVE   Bilirubin Urine NEGATIVE NEGATIVE   Ketones, ur NEGATIVE NEGATIVE mg/dL   Protein, ur NEGATIVE NEGATIVE mg/dL   Nitrite NEGATIVE NEGATIVE   Leukocytes,Ua NEGATIVE NEGATIVE    IMAGING US OB LESS THAN 14 WEEKS WITH OB TRANSVAGINAL  Result Date: 03/05/2019 CLINICAL DATA:  Abdominal pain during first trimester of pregnancy EXAM: OBSTETRIC <14 WK [place] AND TRANSVAGINAL OB US TECHNIQUE: Both transabdominal and transvaginal ultrasound examinations were performed for complete evaluation of the gestation as well as the maternal uterus, adnexal regions, and pelvic cul-de-sac. Transvaginal technique was performed to assess early pregnancy. COMPARISON:  None FINDINGS: Intrauterine gestational sac: Present, single Yolk sac:  Not identified  Embryo:  Not identified Cardiac Activity: N/A Heart Rate: N/A  bpm MSD: 7.2 mm   5 w   3 d Subchorionic hemorrhage:  None identified Maternal uterus/adnexae: Uterus anteverted, otherwise unremarkable. LEFT ovary measures 3.9 x 2.1 x 2.3 cm and contains a small corpus luteum. RIGHT ovary normal size and morphology 1.6 x 2.6 x 1.6 cm. Trace free pelvic fluid. No adnexal masses. IMPRESSION: Gestational sac identified within the uterus though no fetal pole or yolk sac are visualized. Consider follow-up ultrasound in 14 days to establish viability, if clinically indicated. Electronically Signed   By: Ulyses Southward M.D.   On: 03/05/2019 14:53    MAU COURSE Orders Placed This Encounter  Procedures  . Wet prep, genital  . US OB LESS THAN 14 WEEKS WITH OB TRANSVAGINAL  . CBC  . hCG, quantitative, pregnancy  . Urinalysis, Routine w reflex microscopic  . Pregnancy, urine POC  . Discharge patient   No orders of the defined types were placed in this encounter.   MDM +UPT UA, wet prep, GC/chlamydia, CBC, quant hCG, and [place] today to rule out ectopic pregnancy which can be life threatening.   RH negative. No bleeding. Discussed she would need to contact the office for rhogam if she started bleeding.   Ultrasound shows empty ?IUGS. No signs of ectopic. HCG today is 8558 This abdominal pain could represent a normal pregnancy, spontaneous abortion, or even an ectopic pregnancy which can be life-threatening. Cultures were obtained to rule out pelvic infection.  Will bring patient back for repeat HCG on Thursday.   ASSESSMENT 1. Pregnancy of unknown anatomic location   2. Abdominal  pain during pregnancy in first trimester     PLAN Discharge home in stable condition. SAB vs ectopic precautions GC/CT pending Scheduled for stat HCG on Thursday at [facility]  Allergies as of 03/05/2019   No Known Allergies     Medication List    STOP taking these medications   fluconazole 150 MG tablet Commonly known  as: DIFLUCAN   metroNIDAZOLE 500 MG tablet Commonly known as: FLAGYL     TAKE these medications   diphenhydrAMINE 25 MG tablet Commonly known as: BENADRYL Take 25 mg by mouth daily as needed for allergies.   metFORMIN 500 MG 24 hr tablet Commonly known as: GLUCOPHAGE-XR Take 1 tablet (500 mg total) by mouth daily with breakfast.   NIFEdipine 30 MG 24 hr tablet Commonly known as: PROCARDIA-XL/NIFEDICAL-XL TAKE ONE TABLET BY MOUTH DAILY   valACYclovir 1000 MG tablet Commonly known as: VALTREX Take 0.5 tablets (500 mg total) by mouth 2 (two) times daily.        Judeth Horn, NP 03/05/2019  3:05 PM

## 2019-03-05 NOTE — Discharge Instructions (Signed)
Return to care   If you have heavier bleeding that soaks through more that 2 pads per hour for an hour or more  If you bleed so much that you feel like you might pass out or you do pass out  If you have significant abdominal pain that is not improved with Tylenol      Abdominal Pain During Pregnancy  Belly (abdominal) pain is common during pregnancy. There are many possible causes. Most of the time, it is not a serious problem. Other times, it can be a sign that something is wrong with the pregnancy. Always tell your doctor if you have belly pain. Follow these instructions at home:  Do not have sex or put anything in your vagina until your pain goes away completely.  Get plenty of rest until your pain gets better.  Drink enough fluid to keep your pee (urine) pale yellow.  Take over-the-counter and prescription medicines only as told by your doctor.  Keep all follow-up visits as told by your doctor. This is important. Contact a doctor if:  Your pain continues or gets worse after resting.  You have lower belly pain that: ? Comes and goes at regular times. ? Spreads to your back. ? Feels like menstrual cramps.  You have pain or burning when you pee (urinate). Get help right away if:  You have a fever or chills.  You have vaginal bleeding.  You are leaking fluid from your vagina.  You are passing tissue from your vagina.  You throw up (vomit) for more than 24 hours.  You have watery poop (diarrhea) for more than 24 hours.  Your baby is moving less than usual.  You feel very weak or faint.  You have shortness of breath.  You have very bad pain in your upper belly. Summary  Belly (abdominal) pain is common during pregnancy. There are many possible causes.  If you have belly pain during pregnancy, tell your doctor right away.  Keep all follow-up visits as told by your doctor. This is important. This information is not intended to replace advice given to you by  your health care provider. Make sure you discuss any questions you have with your health care provider. Document Revised: 05/07/2018 Document Reviewed: 04/21/2016 Elsevier Patient Education  2020 [employer].

## 2019-03-05 NOTE — MAU Note (Signed)
Presents with c/o lower abdominal pain, denies VB.  Reports unconfirmed pregnancy hasn't taken HPT.  LMP 01/31/19.

## 2019-03-06 LAB — GC/CHLAMYDIA PROBE AMP (~~LOC~~) NOT AT ARMC
Chlamydia: NEGATIVE
Comment: NEGATIVE
Comment: NORMAL
Neisseria Gonorrhea: NEGATIVE

## 2019-03-07 ENCOUNTER — Other Ambulatory Visit: Payer: Self-pay

## 2019-03-07 ENCOUNTER — Ambulatory Visit (INDEPENDENT_AMBULATORY_CARE_PROVIDER_SITE_OTHER): Payer: Medicaid Other

## 2019-03-07 DIAGNOSIS — O3680X0 Pregnancy with inconclusive fetal viability, not applicable or unspecified: Secondary | ICD-10-CM

## 2019-03-07 LAB — BETA HCG QUANT (REF LAB): hCG Quant: 9686 m[IU]/mL

## 2019-03-07 MED ORDER — METFORMIN HCL ER 500 MG PO TB24: 500.0000 mg | ORAL_TABLET | Freq: Every day | ORAL | 0 refills | Status: DC

## 2019-03-07 NOTE — Progress Notes (Signed)
Pt here today for STAT Beta Lab s/p pregnancy unknown location.  Pt denies any vaginal bleeding but having some mild "gas-like" pain all over the belly.  Pt advised that it will take approximately two hours for results in which I will call with f/u.  Pt verbalized understanding.  Received notification from [facility] that beta results are 9686.  Notified Dr. Alysia Penna pt's results.  Provider recommendation to have [place] in 10-14 days.  Notified pt provider's recommendation and pt agreed to [facility] [place] appt scheduled for 03/20/19 @ 1300.  I also advised pt that after her [place] appt she will come over to the office to get results.  Pt asks if she should continue to take Metformin 500 mg for dx PCOS.  Verified with Dr. Alysia Penna if pt can continue to taking or can she stop taking the medication.  Per Dr. Alysia Penna pt can continue to take Metformin for three months and pt can have refill on medication.  Pt verbalized understanding.    Addison Naegeli, RN 03/07/19

## 2019-03-08 NOTE — Progress Notes (Signed)
Agree with A & P. 

## 2019-03-20 ENCOUNTER — Ambulatory Visit (INDEPENDENT_AMBULATORY_CARE_PROVIDER_SITE_OTHER): Payer: Medicaid Other | Admitting: General Practice

## 2019-03-20 ENCOUNTER — Encounter: Payer: Self-pay | Admitting: Family Medicine

## 2019-03-20 ENCOUNTER — Ambulatory Visit (HOSPITAL_COMMUNITY)
Admission: RE | Admit: 2019-03-20 | Discharge: 2019-03-20 | Disposition: A | Discharge status: Home / Self Care | Payer: Medicaid Other | Source: Ambulatory Visit | Attending: Obstetrics and Gynecology | Admitting: Obstetrics and Gynecology

## 2019-03-20 ENCOUNTER — Other Ambulatory Visit: Payer: Self-pay

## 2019-03-20 DIAGNOSIS — Z712 Person consulting for explanation of examination or test findings: Secondary | ICD-10-CM

## 2019-03-20 DIAGNOSIS — O3680X0 Pregnancy with inconclusive fetal viability, not applicable or unspecified: Secondary | ICD-10-CM | POA: Insufficient documentation

## 2019-03-20 DIAGNOSIS — Z3A01 Less than 8 weeks gestation of pregnancy: Secondary | ICD-10-CM | POA: Diagnosis not present

## 2019-03-20 DIAGNOSIS — Z3689 Encounter for other specified antenatal screening: Secondary | ICD-10-CM | POA: Diagnosis not present

## 2019-03-20 NOTE — Progress Notes (Signed)
Patient seen and assessed by nursing staff during this encounter. I have reviewed the chart and agree with the documentation and plan.  Catalina Antigua, MD 03/20/2019 3:05 PM

## 2019-03-20 NOTE — Progress Notes (Signed)
Patient presents to office today for viability ultrasound results. Reviewed results with Dr Jolayne Panther who finds single living IUP, patient should begin OB care.   Informed patient of results, reviewed dating, and provided pictures. Patient verbalized understanding & will begin OB care.   Chase Caller RN BSN 03/20/19

## 2019-04-06 ENCOUNTER — Inpatient Hospital Stay (HOSPITAL_COMMUNITY)
Admission: AD | Admit: 2019-04-06 | Discharge: 2019-04-06 | Disposition: A | Discharge status: Home / Self Care | Payer: Medicaid Other | Attending: Obstetrics & Gynecology | Admitting: Obstetrics & Gynecology

## 2019-04-06 ENCOUNTER — Other Ambulatory Visit: Payer: Self-pay

## 2019-04-06 ENCOUNTER — Encounter (HOSPITAL_COMMUNITY): Payer: Self-pay | Admitting: Obstetrics & Gynecology

## 2019-04-06 DIAGNOSIS — Z3A09 9 weeks gestation of pregnancy: Secondary | ICD-10-CM | POA: Insufficient documentation

## 2019-04-06 DIAGNOSIS — O219 Vomiting of pregnancy, unspecified: Secondary | ICD-10-CM

## 2019-04-06 DIAGNOSIS — O210 Mild hyperemesis gravidarum: Secondary | ICD-10-CM | POA: Diagnosis present

## 2019-04-06 LAB — URINALYSIS, ROUTINE W REFLEX MICROSCOPIC
Bilirubin Urine: NEGATIVE
Glucose, UA: NEGATIVE mg/dL
Hgb urine dipstick: NEGATIVE
Ketones, ur: 5 mg/dL — AB
Leukocytes,Ua: NEGATIVE
Nitrite: NEGATIVE
Protein, ur: NEGATIVE mg/dL
Specific Gravity, Urine: 1.028 (ref 1.005–1.030)
pH: 6 (ref 5.0–8.0)

## 2019-04-06 MED ORDER — ONDANSETRON 4 MG PO TBDP
4.0000 mg | ORAL_TABLET | Freq: Once | ORAL | Status: AC
  Administered 2019-04-06: 22:00:00 4 mg via ORAL
  Filled 2019-04-06: qty 1

## 2019-04-06 MED ORDER — METOCLOPRAMIDE HCL 10 MG PO TABS
10.0000 mg | ORAL_TABLET | Freq: Once | ORAL | Status: AC
  Administered 2019-04-06: 10 mg via ORAL
  Filled 2019-04-06: qty 1

## 2019-04-06 NOTE — Discharge Instructions (Signed)

## 2019-04-06 NOTE — MAU Provider Note (Signed)
History     CSN: [id number]  Arrival date and time: 04/06/19 2026   First Provider Initiated Contact with Patient 04/06/19 2130      Chief Complaint  Patient presents with  . Nausea  . Emesis   Julia Potter is a 29 y.o. [id number] at [redacted]w[redacted]d who receives care at [facility].  She presents today for Nausea and Emesis.  She states she has been unable to keep anything down, but has been able to eat.  She reports daily nausea, but does not know what to take.  She states she has thrown up about 3x today.  Patient reports eating mashed potatoes, corn, and drinking water prior to arrival. However, she states she threw up upon arrival to the hospital. Patient reports eating an apple and peanut butter this morning and half of a peanut butter and jelly sandwich for lunch. Patient states that she was "close to 200lbs" at the beginning of the pregnancy and states today she weighs 190lbs.      OB History    Gravida  5   Para  1   Term  0   Preterm  1   AB  3   Living  1     SAB  3   TAB  0   Ectopic  0   Multiple  0   Live Births  1        Obstetric Comments  G1P0101 Admitted to [facility] for [place], IOL. Delivered a viable female at 34 weeks        Past Medical History:  Diagnosis Date  . ALLERGIC RHINITIS   . Anxiety    no meds  . Chlamydia   . Depression    doing ok, in therapy - no meds  . Herpes   . HSV infection   . Pregnancy induced hypertension 2010   resolved after pregnancy  . Rh negative state in antepartum period 05/10/2017   [ ] Rhophylac  . Seasonal allergies     Past Surgical History:  Procedure Laterality Date  . DILATION AND EVACUATION N/A 05/23/2017   Procedure: DILATATION AND EVACUATION;  Surgeon: Aletha Halim, MD;  Location: [facility];  Service: Gynecology;  Laterality: N/A;  needs [place]  . INTRAUTERINE DEVICE INSERTION  09/2010  . IUD REMOVAL      Family History  Problem Relation Age of Onset  . Diabetes Maternal Grandmother   . Asthma Daughter      Social History   Tobacco Use  . Smoking status: Never Smoker  . Smokeless tobacco: Never Used  Substance Use Topics  . Alcohol use: Not Currently  . Drug use: No    Allergies: No Known Allergies  Medications Prior to Admission  Medication Sig Dispense Refill Last Dose  . diphenhydrAMINE (BENADRYL) 25 MG tablet Take 25 mg by mouth daily as needed for allergies.   04/05/2019 at Unknown time  . metFORMIN (GLUCOPHAGE-XR) 500 MG 24 hr tablet Take 1 tablet (500 mg total) by mouth daily with breakfast. 90 tablet 0 04/05/2019 at Unknown time  . NIFEdipine (PROCARDIA-XL/NIFEDICAL-XL) 30 MG 24 hr tablet TAKE ONE TABLET BY MOUTH DAILY 30 tablet 2   . valACYclovir (VALTREX) 1000 MG tablet Take 0.5 tablets (500 mg total) by mouth 2 (two) times daily. 20 tablet 0     Review of Systems  Constitutional: Negative for chills and fever.  Respiratory: Negative for cough and shortness of breath.   Gastrointestinal: Positive for nausea and vomiting. Negative for constipation and diarrhea.  Genitourinary: Positive for vaginal discharge (Watery). Negative for difficulty urinating, dysuria and vaginal bleeding.  Neurological: Negative for dizziness, light-headedness and headaches.   Physical Exam   Blood pressure 132/81, pulse (!) 101, temperature 98.6 F (37 C), temperature source Oral, resp. rate 17, last menstrual period 01/31/2019.  Physical Exam  Constitutional: She appears well-developed and well-nourished. No distress.  Patient laying in bed.   HENT:  Head: Normocephalic and atraumatic.  Eyes: Conjunctivae are normal.  Cardiovascular: Normal rate, regular rhythm and normal heart sounds.  Respiratory: Effort normal and breath sounds normal. No respiratory distress.  GI: Soft. There is no abdominal tenderness.  Musculoskeletal:     Cervical back: Normal range of motion.  Neurological: She is alert.  Skin: Skin is warm and dry.  Psychiatric: She has a normal mood and affect. Her behavior is  normal.  Speech is clear and coherent.     MAU Course  Procedures Results for orders placed or performed during the hospital encounter of 04/06/19 (from the past 24 hour(s))  Urinalysis, Routine w reflex microscopic     Status: Abnormal   Collection Time: 04/06/19 10:24 PM  Result Value Ref Range   Color, Urine YELLOW YELLOW   APPearance HAZY (A) CLEAR   Specific Gravity, Urine 1.028 1.005 - 1.030   pH 6.0 5.0 - 8.0   Glucose, UA NEGATIVE NEGATIVE mg/dL   Hgb urine dipstick NEGATIVE NEGATIVE   Bilirubin Urine NEGATIVE NEGATIVE   Ketones, ur 5 (A) NEGATIVE mg/dL   Protein, ur NEGATIVE NEGATIVE mg/dL   Nitrite NEGATIVE NEGATIVE   Leukocytes,Ua NEGATIVE NEGATIVE    MDM Physical Exam AntiEmetics Labs: UA Assessment and Plan  29 year old G5P0131 at 9.2 weeks Nausea/Vomiting  -POC discussed. -Will give Reglan and Zofran PO since patient tolerating foods/waters. -Will return to reassess.   Cherre Robins 04/06/2019, 9:30 PM   Reassessment (10:39 PM) -Patient reports that she feels she can eat, but does not want saltines or ginger ale. -Patient questions if "spitting" is normal and informed that it is a common occurrence during pregnancy and medication can be given for it. Patient agreeable. -Rx for robinul, reglan, and zofran sent to pharmacy on file.  -Encouraged to call or return to MAU if symptoms worsen or with the onset of new symptoms. -Discharged to home in stable condition.  Cherre Robins MSN, CNM Advanced Practice Provider, [facility] [employer]

## 2019-04-06 NOTE — MAU Note (Signed)
Patient reports to MAU c/o NV that has been ongoing with this pregnancy. Pt states she is unable to keep anything down. Pt denies bleeding. Pt had a little bit of cramping in her back yesterday but none today. Pt reports she has vomited 3x today.  190.7lb

## 2019-04-18 ENCOUNTER — Ambulatory Visit (INDEPENDENT_AMBULATORY_CARE_PROVIDER_SITE_OTHER): Payer: Medicaid Other | Admitting: *Deleted

## 2019-04-18 ENCOUNTER — Other Ambulatory Visit: Payer: Self-pay

## 2019-04-18 DIAGNOSIS — O0990 Supervision of high risk pregnancy, unspecified, unspecified trimester: Secondary | ICD-10-CM | POA: Insufficient documentation

## 2019-04-18 DIAGNOSIS — N96 Recurrent pregnancy loss: Secondary | ICD-10-CM | POA: Insufficient documentation

## 2019-04-18 DIAGNOSIS — Z6791 Unspecified blood type, Rh negative: Secondary | ICD-10-CM | POA: Insufficient documentation

## 2019-04-18 DIAGNOSIS — Z8679 Personal history of other diseases of the circulatory system: Secondary | ICD-10-CM | POA: Insufficient documentation

## 2019-04-18 DIAGNOSIS — O99210 Obesity complicating pregnancy, unspecified trimester: Secondary | ICD-10-CM | POA: Insufficient documentation

## 2019-04-18 DIAGNOSIS — O210 Mild hyperemesis gravidarum: Secondary | ICD-10-CM

## 2019-04-18 DIAGNOSIS — O26899 Other specified pregnancy related conditions, unspecified trimester: Secondary | ICD-10-CM | POA: Insufficient documentation

## 2019-04-18 DIAGNOSIS — O10919 Unspecified pre-existing hypertension complicating pregnancy, unspecified trimester: Secondary | ICD-10-CM | POA: Insufficient documentation

## 2019-04-18 DIAGNOSIS — Z8759 Personal history of other complications of pregnancy, childbirth and the puerperium: Secondary | ICD-10-CM | POA: Insufficient documentation

## 2019-04-18 DIAGNOSIS — E669 Obesity, unspecified: Secondary | ICD-10-CM | POA: Insufficient documentation

## 2019-04-18 DIAGNOSIS — B009 Herpesviral infection, unspecified: Secondary | ICD-10-CM | POA: Insufficient documentation

## 2019-04-18 MED ORDER — PROMETHAZINE HCL 25 MG PO TABS: 25.0000 mg | ORAL_TABLET | Freq: Four times a day (QID) | ORAL | 0 refills | Status: DC | PRN

## 2019-04-18 NOTE — Progress Notes (Signed)
2:45 Addendum:Found that rx printed instead of eprescribed- called in RX.  Aneita Kiger,RN

## 2019-04-18 NOTE — Addendum Note (Signed)
Addended by: Gerome Apley on: 04/18/2019 02:48 PM   Modules accepted: Orders

## 2019-04-18 NOTE — Patient Instructions (Signed)

## 2019-04-18 NOTE — Progress Notes (Signed)
I connected with  Synetta Fail on 04/18/19 at 10:30 AM EDT by telephone and verified that I am speaking with the correct person using two identifiers.   I discussed the limitations, risks, security and privacy concerns of performing an evaluation and management service by telephone and the availability of in person appointments. I also discussed with the patient that there may be a patient responsible charge related to this service. The patient expressed understanding and agreed to proceed.  I explained I am completing her New OB Intake today. We discussed Her EDD and that it is based on  sure LMP . I reviewed her allergies, meds, OB History, Medical /Surgical history, and appropriate screenings. I informed her of [facility] services. I offered her an appointment due to her history and also she expresses nervousness about this pregnancy due to her history of 3 sab's.She accepted referral/ appointment. I explained registrars will schedule it and contact her with an appt.  Patient and/or legal guardian verbally consented to [facility] services about presenting concerns and psychiatric consultation as appropriate.  She c/o morning sickness and would like RX. I discussed her options per protocol and she elected to try [email or web address] sent in per protocol.   I explained we will have her take her blood pressure weekly and asked if she has a cuff. She confirms she has her own blood pressure cuff and knows how to use it. I explained  then we will have her take her blood pressure weekly and enter into the app. I explained she will have some visits in office and some virtually. She already has Sports coach. I reviewed her new ob  appointment date/ time with her , our location and to wear mask, no visitors.  I explained she will have a pelvic exam, ob bloodwork, hemoglobin a1C, cbg ,pap- may be delayed due to last pap 10/2016, and  genetic testing if desired,- she does want a panorama. I scheduled an [place] at  19 weeks and gave her the appointment. She voices understanding.   Taiga Lupinacci,RN 04/18/2019  10:25 AM

## 2019-04-25 ENCOUNTER — Other Ambulatory Visit (HOSPITAL_COMMUNITY)
Admission: RE | Admit: 2019-04-25 | Discharge: 2019-04-25 | Disposition: A | Discharge status: Home / Self Care | Payer: Medicaid Other | Source: Ambulatory Visit | Attending: Obstetrics and Gynecology | Admitting: Obstetrics and Gynecology

## 2019-04-25 ENCOUNTER — Other Ambulatory Visit: Payer: Self-pay

## 2019-04-25 ENCOUNTER — Encounter: Payer: Self-pay | Admitting: Obstetrics and Gynecology

## 2019-04-25 ENCOUNTER — Ambulatory Visit (INDEPENDENT_AMBULATORY_CARE_PROVIDER_SITE_OTHER): Payer: Medicaid Other | Admitting: Obstetrics and Gynecology

## 2019-04-25 VITALS — BP 138/72 | HR 120 | Wt 195.1 lb

## 2019-04-25 DIAGNOSIS — N96 Recurrent pregnancy loss: Secondary | ICD-10-CM

## 2019-04-25 DIAGNOSIS — O0990 Supervision of high risk pregnancy, unspecified, unspecified trimester: Secondary | ICD-10-CM | POA: Diagnosis not present

## 2019-04-25 DIAGNOSIS — Z8742 Personal history of other diseases of the female genital tract: Secondary | ICD-10-CM

## 2019-04-25 DIAGNOSIS — Z8751 Personal history of pre-term labor: Secondary | ICD-10-CM | POA: Insufficient documentation

## 2019-04-25 DIAGNOSIS — O09891 Supervision of other high risk pregnancies, first trimester: Secondary | ICD-10-CM | POA: Diagnosis not present

## 2019-04-25 DIAGNOSIS — Z6791 Unspecified blood type, Rh negative: Secondary | ICD-10-CM

## 2019-04-25 DIAGNOSIS — Z8759 Personal history of other complications of pregnancy, childbirth and the puerperium: Secondary | ICD-10-CM | POA: Diagnosis not present

## 2019-04-25 DIAGNOSIS — O26899 Other specified pregnancy related conditions, unspecified trimester: Secondary | ICD-10-CM

## 2019-04-25 HISTORY — DX: Personal history of other diseases of the female genital tract: Z87.42

## 2019-04-25 MED ORDER — ASPIRIN EC 81 MG PO TBEC: 81.0000 mg | DELAYED_RELEASE_TABLET | Freq: Every day | ORAL | 2 refills | Status: DC

## 2019-04-25 NOTE — Progress Notes (Signed)
Subjective:  Julia Potter is a 29 y.o. B9T9030 at [redacted]w[redacted]d being seen today for her first OB visit. EDD by LMP and confirmed by first trimester U/S. H/O first trimester SAB x 2, last April 2019 triple gestation at 6 weeks. H/O PCOS on Metformin. H/O PEC with IOL at 34 weeks.  She is currently monitored for the following issues for this high-risk pregnancy and has Psychiatric illness; Hirsutism; BMI 40.0-44.9, adult (HCC); Supervision of high risk pregnancy, antepartum; History of pregnancy induced hypertension; Obesity; Obesity in pregnancy; Herpes; History of multiple miscarriages; Rh negative state in antepartum period; History of preterm delivery; and History of PCOS on their problem list.  Patient reports no complaints.  Contractions: Not present. Vag. Bleeding: None.  Movement: Absent. Denies leaking of fluid.   The following portions of the patient's history were reviewed and updated as appropriate: allergies, current medications, past family history, past medical history, past social history, past surgical history and problem list. Problem list updated.  Objective:   Vitals:   04/25/19 1000  BP: 138/72  Pulse: (!) 120  Weight: 195 lb 1.6 oz (88.5 kg)    Fetal Status:     Movement: Absent     General:  Alert, oriented and cooperative. Patient is in no acute distress.  Skin: Skin is warm and dry. No rash noted.   Cardiovascular: Normal heart rate noted  Respiratory: Normal respiratory effort, no problems with respiration noted  Abdomen: Soft, gravid, appropriate for gestational age. Pain/Pressure: Absent     Pelvic:  Cervical exam performed        Extremities: Normal range of motion.  Edema: None  Mental Status: Normal mood and affect. Normal behavior. Normal judgment and thought content.   Urinalysis:      Assessment and Plan:  Pregnancy: G5P0131 at [redacted]w[redacted]d  1. Supervision of high risk pregnancy, antepartum Prenatal care and labs reviewed with pt Genetic testing discussed BP  monitoring reviewed Pt has BP cuff Declined flu vaccine - Comprehensive metabolic panel - Culture, OB Urine - Genetic Screening - Hemoglobin A1c - Obstetric Panel, Including HIV - Protein / creatinine ratio, urine - TSH - Cytology - PAP( [place]) - Hepatitis C antibody  2. History of pregnancy induced hypertension Will check additional labs. Start BASA qd. Indications reviewed with pt  3. History of multiple miscarriages Genetic testing today  4. Rh negative state in antepartum period Rhogam as indicated  5. History of preterm delivery Secondary to PEC, not a 17 OHP candidate  6. History of PCOS Will continue with Metformin through first trimester and then discontinue. Will check A1c  Preterm labor symptoms and general obstetric precautions including but not limited to vaginal bleeding, contractions, leaking of fluid and fetal movement were reviewed in detail with the patient. Please refer to After Visit Summary for other counseling recommendations.  Return in about 4 weeks (around 05/23/2019) for OB visit, face to face, MD provider.   Hermina Staggers, MD

## 2019-04-25 NOTE — Progress Notes (Signed)
Medicaid Home Form Completed-04/25/19

## 2019-04-25 NOTE — Patient Instructions (Addendum)
AREA PEDIATRIC/FAMILY PRACTICE PHYSICIANS  [place] ([address]) . [facility] o Chambliss, MD; Eniola, MD; Hale, MD; Hensel, MD; McDiarmid, MD; McIntyer, MD; Asiana Benninger, MD; Walden, MD o [address] [place], [place] [address] o [phone] o Mon-Fri 8:30-12:30, 1:30-5:00 o Providers come to see babies at [facility] o Accepting Medicaid . [facility] o Limited providers who accept newborns: Koirala, MD; Morrow, MD; Wolters, MD o [address], [place], [place] [address] o [phone] o Mon-Fri 8:00-5:30 o Babies seen by providers at [facility] o Does NOT accept Medicaid o Please call early in hospitalization for appointment (limited availability)  . [facility] o Mulberry, MD o [address] [place], [place] [address] o [phone] o Mon, Tue, Thur, Fri 8:30-5:00, Wed 10:00-7:00 (closed 1-2pm) o Babies seen by [facility] providers o Accepting Medicaid . [facility] - Pediatrician o Rubin, MD o [address], [place], [place] [address] o [phone] o Mon-Fri 8:30-5:00, Sat 8:30-12:00 o Provider comes to see babies at [facility] o Accepting Medicaid o Must have been referred from current patients or contacted office prior to delivery . [facility] ([facility]) o Brown, MD; Chandler, MD; Ettefagh, MD; Grant, MD; Lester, MD; McCormick, MD; McQueen, MD; Prose, MD; Simha, MD; Stanley, MD; Stryffeler, NP; Tebben, NP o [address], [place], [place] [address] o [phone] o Mon, Tue, Thur, Fri 8:30-5:30, Wed 9:30-5:30, Sat 8:30-12:30 o Babies seen by [facility] providers o Accepting Medicaid o Only accepting infants of first-time parents or siblings of current patients o Hospital discharge coordinator will make follow-up appointment . Jack Amos o [address],  [place], [place]  [address] o [phone]   Fax - [phone] . [facility] o [address], [place], [place]  [address] o Phone - [phone]   Fax - [phone] . Shilpa Gosrani o [address], [place], [place]  [address] o [phone]  [place] ([address]) . [facility] o Bates, MD; Brassfield, MD; Cooper, Cox, MD; MD; Davis, MD; Dovico, MD; Ettefaugh, MD; Little, MD; Lowe, MD; Keiffer, MD; Melvin, MD; Sumner, MD; Williams, MD o [address], [place], [place] [address] o [phone] o Mon-Fri 8:30-5:00 (extended evenings Mon-Thur as needed), Sat-Sun 10:00-1:00 o Providers come to see babies at [facility] o Accepting Medicaid for families of first-time babies and families with all children in the household age 3 and under. Must register with office prior to making appointment (M-F only). . [facility] o Henson, NP; Knapp, MD; Lalonde, MD; Tysinger, PA o [address] [place], [place] [address] o [phone] o Mon-Fri 8:00-5:00 o Babies seen by providers at [facility] o Does NOT accept Medicaid/Commercial Insurance Only . [facility] ([facility])  o Artis, MD; Barnes, MD; Bratton, MD; Coccaro, MD; Lockett Gardner, MD; Kramer, MD; Marshall, MD; Netherton, MD; Poleto, MD; Skinner, MD o [address] [place], [place] [address] o [phone] o Mon-Fri 8:30-5:30, Sat (Oct.-Mar.) 9:00-1:00 o Babies seen by providers at [facility] o Accepting Medicaid  [place] ([address]) . [facility] o Reid, MD; Warner, MD o [address], [place],  [address] o [phone] o Mon-Fri 8:30-5:00, Sat 8:30-12:00 o Providers come to see babies at [facility] o Does NOT accept Medicaid . [facility] o Becker, PA; Hagler, MD; Scifres, PA; Sun, MD; Swayne, MD o [address],  [place], [place] [address] o [phone] o Mon-Fri 8:00-5:00 o Babies seen by providers at [facility] o Does NOT accept Medicaid o Only accepting babies of parents who are patients o Please call early in hospitalization for appointment (limited availability) . [facility] o Clark, MD; Frye, MD; Kelleher, MD; Mack, NP; Miller, MD; O'Keller, MD; Patterson, NP; Pudlo, MD; Puzio, MD; Thomas, MD; Tucker, MD; Twiselton, MD o [address], [place], [place] [address] o [phone] o Mon-Fri 8:00-5:00, Sat 9:00-12:00 o Providers come to see babies at [facility] o Does NOT accept Medicaid  [place] ([address]) . [facility] o Limited providers accepting new patients: Brake, NP; Wharton, PA o [address], [place], [place] [address] o [phone] o Mon-Fri 8:00-5:00 o Babies seen by providers at [facility] o Does NOT accept Medicaid o Only accepting babies of parents who are patients o Please call early in hospitalization for appointment (limited availability) . [facility] o Gay, MD; Quinlan, MD o [address] [place], [place] [address] o [phone] (press 1 to schedule appointment) o Mon-Fri 8:00-5:00 o Providers come to see babies at [facility] o Does NOT accept Medicaid . [facility] o Mazer, MD o [address] [place], [place] [address] o [phone] o Mon-Fri 8:30-5:00 (lunch 12:30-1:00), extended hours by appointment only Wed 5:00-6:30 o Babies seen by [facility] providers o Accepting Medicaid . [facility] o Banks, MD; Jordan, MD; Koberlein, MD o [address], [place], [place] [address] o [phone] o Mon-Fri 8:00-5:00 o Babies seen by [facility] providers o Does NOT accept Medicaid . [facility] o Parker, MD; Hunter, MD; Wallace, DO o [address] [place], [place]  [address] o [phone] o Mon-Fri 8:00-5:00 o Babies seen by [facility] providers o Does NOT accept Medicaid . [facility] o Brandon, PA; Brecken, PA; Christy, NP; Dees, MD; DeClaire, MD; DeWeese, MD; Hansen, NP; Mills, NP; Parrish, NP; Smoot, NP; Summer, MD; Vapne, MD o [address] [place], [place] [address] o [phone] o Mon-Fri 8:30-5:00, Sat 10:00-1:00 o Providers come to see babies at [facility] o Does NOT accept Medicaid o Free prenatal information session Tuesdays at 4:45pm . [facility] o Bouska, MD; Gordon, PA; Jeffery, PA; Weber, PA o [address] [place] [address] o [phone] o Mon-Fri 7:30-5:30 o Babies seen by [facility] providers . [facility] o [address], [place], [place], [place]  [address] o [phone]   Fax - [phone]  [place] ([phone]) . [facility] o Reese, MD o [address] [place], [place] [address] o [phone] o Mon-Thur 8:00-6:00 o Providers come to see babies at [facility] o Accepting Medicaid . [facility] o Anderson, NP; Badger, MD; Beal, PA; Spencer, PA o [address] [place],  [address] o [phone] o Mon-Thur 7:30-7:30, Fri 7:30-4:30 o Babies seen by [facility] providers o Accepting Medicaid . [facility] o Agbuya, MD; Klett, NP; Romgoolam, MD o [address], ,  [address] o [phone] o Mon-Fri 8:30-5:00, Sat 8:30-12:00 o Providers come to see babies at [facility] o Accepting Medicaid o Must have "Meet & Greet" appointment at office prior to delivery . [facility]  ([facility] ) o McCord,   MD; Juleen China, MD; Clydene Laming, [address], [place], [place] [address] o [phone] o Mon-Wed 8:00-6:00, Thur-Fri 8:00-5:00, Sat 9:00-12:00 o Providers come to  see babies at [facility] o Does NOT accept Medicaid o Only accepting siblings of current patients . [facility] [place], [place], [place], [place]  [address] o [phone]   Fax [phone] . [facility] [place] [address], [place], [place]  [address] o [phone]   Fax - Morton Gorman [phone] & [phone]) . Therapist, music at [place], DO; [place], [address] [place], [place] [address] o [phone] o Mon-Fri 7:00-5:00 o Babies seen by [facility] providers o Does NOT accept Medicaid . [place], MD; [place], [place]; [place], [place], [place], [place] [address] o [phone] o Mon-Fri 8:00-5:00 o Babies seen by [facility] providers o Accepting Medicaid . Lamont, MD; [place], [place]; [place], NP; [place], [place], [place], [place] [address] o [phone] o Mon-Fri 8:00-5:00 o Babies seen by providers at [facility] [place] [phone]) . [facility], [place] o [address] [place], [place] [address] o [phone] o Mon-Fri 8:00-5:00 o Babies seen by [facility] providers o Does NOT accept Medicaid o Limited availability, please call early in hospitalization to schedule follow-up . [facility] Leilani Merl, PA; Maisie Fus, MD; [place], MD; [place], [place]; Jeannine Kitten, MD; [place], [place] [facility] [address], [place], [place] [address] o [phone] o Mon-Fri 8:30-5:00, Sat 9:00-12:00 o Babies seen by providers at [facility] o Accepting Medicaid o Please register online then schedule online or call office o [email or web address] . [place] ([facility]  [place]) Kristian Covey, NP; Dwyane Dee, MD; Leonidas Romberg, PA o [address] [place], [place], [place] [address] o [phone] o Mon-Fri 8:00-5:00 o Babies seen by providers at [facility] o Accepting Medicaid . [place] ([facility]) Dairl Ponder, MD; Rayvon Char, NP; Melina Modena, MD o [address] [place], [place], [place] [address] o [phone] o Mon-Fri 8:00-5:30, Sat&Sun by appointment (phones open at 8:30) o Babies seen by [facility] providers o Accepting Medicaid o Must be a first-time baby or sibling of current patient . [place], [address], [place], [place]  [address] o [phone]   Fax - [phone]  [place] [phone] & [phone]) . [place], [place]; [place], [place]; Benjamine Mola, MD; [place], [place]; Harrell Lark, MD o [address] [place], [place] [address] o [phone] o Mon-Thur 8:00-7:00, Fri 8:00-5:00, Sat 8:00-12:00, Sun 9:00-12:00 o Babies seen by [facility] providers o Accepting Medicaid . [facility], MD; Ruthann Cancer, MD; [facility], MD o [address], [place], [place] [address] o [phone] o Mon-Thur 8:00-5:00 o Babies seen by providers at [facility] o Accepting Medicaid . [facility] [place], MD; Coe-Goins, MD; Amedeo Plenty, MD; Bobby Rumpf, MD; List, MD; Lavonia Drafts, MD; Ruthann Cancer, MD; Selinda Eon, MD; Audie Box, MD; Jim Like, MD; Christie Nottingham, MD; Hubbard Hartshorn, MD; Modena Nunnery, MD o [address] [place], [place]  [address] o [phone] o Mon-Fri 8:00-5:30, Sat (Oct.-Mar.) 9:00-1:00 o Babies seen by providers at [facility] o Accepting Medicaid o Must fill out new patient packet, available online at [email or web address] . [place] ([facility]) Barnabas Lister, NP; [place]on Kingfisher, NP; Claiborne Billings, NP; Rolla Plate, MD;  [place], [place]; Carola Rhine, MD; Tyron Russell, MD; Delia Chimes, NP o [address], [place], [place] [address] o [phone] o Mon-Thur 8:00-5:30, Fri 8:00-5:00 o Babies seen by providers at [place] [phone]) . [place], [place]; [place]inton, MD; Dennard Schaumann, MD; [place], [place] o [address] [place], [place] [address] o [phone] o Mon-Fri 8:00-5:00 o Babies seen by providers at [place] [phone]) . [place] [facility] [place], [place]; Olen Pel, MD; [place], [place], [place], [place] [address] o [phone] o Mon-Fri 8:00-5:00 o Babies seen by providers at [facility] o Does NOT accept Medicaid o Limited appointment availability, please call early in hospitalization  . Therapist, music at [place], [place]; [place], [place], [place] [address] o [phone] o Mon-Fri 8:00-5:00 o Babies seen by [facility] providers o Does NOT accept Medicaid . [facility] Su Grand, MD; Guy Sandifer, MD; [place], [place]; [place], [place] [address], [place], [place] [address] o [phone] o Mon-Fri 8:00-5:00 o After hours clinic [facility] [place], [place] [address]) [phone] Mon-Fri 5:00-8:00, Sat 12:00-6:00, Sun 10:00-4:00 o Babies seen by [facility] providers o Accepting Medicaid . [place] at [facility] o [address] [place], [place]  [address] o [phone]   Fax - [phone]  [place] [phone]) . Therapist, music at [facility], MD o [address] [place], [place], [place] [address] o [phone] o Mon-Fri 8:00-5:00 o Babies seen by [facility] providers o Does NOT accept Medicaid . [place] ([place] [facility] [place]) Bing Neighbors, MD o [address], [place], [place]  [address] o [phone] o Mon-Thur 8:00-7:00, Fri 8:00-5:00, Sat 8:00-12:00 o Babies seen by providers at [facility] o Accepting Medicaid - but does not have vaccinations in office (must be received elsewhere) o Limited availability, please call early in hospitalization  [place] ([address]) . [place], [place], [place] [address] o [phone]  Fax [phone]  First Trimester of Pregnancy The first trimester of pregnancy is from week 1 until the end of week 13 (months 1 through 3). A week after a sperm fertilizes an egg, the egg will implant on the wall of the uterus. This embryo will begin to develop into a baby. Genes from you and your partner will form the baby. The female genes will determine whether the baby will be a boy or a girl. At 6-8 weeks, the eyes and face will be formed, and the heartbeat can be seen on ultrasound. At the end of 12 weeks, all the baby's organs will be formed. Now that you are pregnant, you will want to do everything you can to have a healthy baby. Two of the most important things are to get good prenatal care and to follow your health care provider's instructions. Prenatal care is all the medical care you receive before the baby's birth. This care will help prevent,  find, and treat any problems during the pregnancy and childbirth. Body changes during your first trimester Your body goes through many changes during pregnancy. The changes vary from woman to woman.  You may gain or lose a couple of pounds at first.  You may feel sick to your stomach (nauseous) and you may throw up (vomit). If the vomiting is uncontrollable, call your health care provider.  You may tire easily.  You may develop headaches that can be relieved by medicines. All medicines should be approved by your health care provider.  You may urinate more often. Painful urination may mean you have a bladder infection.  You may develop  heartburn as a result of your pregnancy.  You may develop constipation because certain hormones are causing the muscles that push stool through your intestines to slow down.  You may develop hemorrhoids or swollen veins (varicose veins).  Your breasts may begin to grow larger and become tender. Your nipples may stick out more, and the tissue that surrounds them (areola) may become darker.  Your gums may bleed and may be sensitive to brushing and flossing.  Dark spots or blotches (chloasma, mask of pregnancy) may develop on your face. This will likely fade after the baby is born.  Your menstrual periods will stop.  You may have a loss of appetite.  You may develop cravings for certain kinds of food.  You may have changes in your emotions from day to day, such as being excited to be pregnant or being concerned that something may go wrong with the pregnancy and baby.  You may have more vivid and strange dreams.  You may have changes in your hair. These can include thickening of your hair, rapid growth, and changes in texture. Some women also have hair loss during or after pregnancy, or hair that feels dry or thin. Your hair will most likely return to normal after your baby is born. What to expect at prenatal visits During a routine prenatal visit:  You will be weighed to make sure you and the baby are growing normally.  Your blood pressure will be taken.  Your abdomen will be measured to track your baby's growth.  The fetal heartbeat will be listened to between weeks 10 and 14 of your pregnancy.  Test results from any previous visits will be discussed. Your health care provider may ask you:  How you are feeling.  If you are feeling the baby move.  If you have had any abnormal symptoms, such as leaking fluid, bleeding, severe headaches, or abdominal cramping.  If you are using any tobacco products, including cigarettes, chewing tobacco, and electronic cigarettes.  If you have  any questions. Other tests that may be performed during your first trimester include:  Blood tests to find your blood type and to check for the presence of any previous infections. The tests will also be used to check for low iron levels (anemia) and protein on red blood cells (Rh antibodies). Depending on your risk factors, or if you previously had diabetes during pregnancy, you may have tests to check for high blood sugar that affects pregnant women (gestational diabetes).  Urine tests to check for infections, diabetes, or protein in the urine.  An ultrasound to confirm the proper growth and development of the baby.  Fetal screens for spinal cord problems (spina bifida) and Down syndrome.  HIV (human immunodeficiency virus) testing. Routine prenatal testing includes screening for HIV, unless you choose not to have this test.  You may need other tests to make sure you and the baby are doing well. Follow these instructions at home: Medicines  Follow your health care provider's instructions regarding medicine use. Specific medicines may be either safe or unsafe to take during pregnancy.  Take a prenatal vitamin that contains at least 600 micrograms (mcg) of folic acid.  If you develop constipation, try taking a stool softener if your health care provider approves. Eating and drinking   Eat a balanced diet that includes fresh fruits and vegetables, whole grains, good sources of protein such as meat, eggs, or tofu, and low-fat dairy. Your health care provider will help you determine the amount of weight gain that is right for you.  Avoid raw meat and uncooked cheese. These carry germs that can cause birth defects in the baby.  Eating four or five small meals rather than three large meals a day may help relieve nausea and vomiting. If you start to feel nauseous, eating a few soda crackers can be helpful. Drinking liquids between meals, instead of during meals, also seems to help ease nausea  and vomiting.  Limit foods that are high in fat and processed sugars, such as fried and sweet foods.  To prevent constipation: ? Eat foods that are high in fiber, such as fresh fruits and vegetables, whole grains, and beans. ? Drink enough fluid to keep your urine clear or pale yellow. Activity  Exercise only as directed by your health care provider. Most women can continue their usual exercise routine during pregnancy. Try to exercise for 30 minutes at least 5 days a week. Exercising will help you: ? Control your weight. ? Stay in shape. ? Be prepared for labor and delivery.  Experiencing pain or cramping in the lower abdomen or lower back is a good sign that you should stop exercising. Check with your health care provider before continuing with normal exercises.  Try to avoid standing for long periods of time. Move your legs often if you must stand in one place for a long time.  Avoid heavy lifting.  Wear low-heeled shoes and practice good posture.  You may continue to have sex unless your health care provider tells you not to. Relieving pain and discomfort  Wear a good support bra to relieve breast tenderness.  Take warm sitz baths to soothe any pain or discomfort caused by hemorrhoids. Use hemorrhoid cream if your health care provider approves.  Rest with your legs elevated if you have leg cramps or low back pain.  If you develop varicose veins in your legs, wear support hose. Elevate your feet for 15 minutes, 3-4 times a day. Limit salt in your diet. Prenatal care  Schedule your prenatal visits by the twelfth week of pregnancy. They are usually scheduled monthly at first, then more often in the last 2 months before delivery.  Write down your questions. Take them to your prenatal visits.  Keep all your prenatal visits as told by your health care provider. This is important. Safety  Wear your seat belt at all times when driving.  Make a list of emergency phone numbers,  including numbers for family, friends, the hospital, and police and fire departments. General instructions  Ask your health care provider for a referral to a local prenatal education class. Begin classes no later than the beginning of month 6 of your pregnancy.  Ask for help if you have counseling or nutritional needs during pregnancy. Your health care provider can offer advice or refer you  to specialists for help with various needs.  Do not use hot tubs, steam rooms, or saunas.  Do not douche or use tampons or scented sanitary pads.  Do not cross your legs for long periods of time.  Avoid cat litter boxes and soil used by cats. These carry germs that can cause birth defects in the baby and possibly loss of the fetus by miscarriage or stillbirth.  Avoid all smoking, herbs, alcohol, and medicines not prescribed by your health care provider. Chemicals in these products affect the formation and growth of the baby.  Do not use any products that contain nicotine or tobacco, such as cigarettes and e-cigarettes. If you need help quitting, ask your health care provider. You may receive counseling support and other resources to help you quit.  Schedule a dentist appointment. At home, brush your teeth with a soft toothbrush and be gentle when you floss. Contact a health care provider if:  You have dizziness.  You have mild pelvic cramps, pelvic pressure, or nagging pain in the abdominal area.  You have persistent nausea, vomiting, or diarrhea.  You have a bad smelling vaginal discharge.  You have pain when you urinate.  You notice increased swelling in your face, hands, legs, or ankles.  You are exposed to fifth disease or chickenpox.  You are exposed to [place] measles (rubella) and have never had it. Get help right away if:  You have a fever.  You are leaking fluid from your vagina.  You have spotting or bleeding from your vagina.  You have severe abdominal cramping or  pain.  You have rapid weight gain or loss.  You vomit blood or material that looks like coffee grounds.  You develop a severe headache.  You have shortness of breath.  You have any kind of trauma, such as from a fall or a car accident. Summary  The first trimester of pregnancy is from week 1 until the end of week 13 (months 1 through 3).  Your body goes through many changes during pregnancy. The changes vary from woman to woman.  You will have routine prenatal visits. During those visits, your health care provider will examine you, discuss any test results you may have, and talk with you about how you are feeling. This information is not intended to replace advice given to you by your health care provider. Make sure you discuss any questions you have with your health care provider. Document Revised: 12/30/2016 Document Reviewed: 12/30/2015 Elsevier Patient Education  2020 [employer].

## 2019-04-26 LAB — OBSTETRIC PANEL, INCLUDING HIV
Antibody Screen: NEGATIVE
Basophils Absolute: 0 10*3/uL (ref 0.0–0.2)
Basos: 0 %
EOS (ABSOLUTE): 0.1 10*3/uL (ref 0.0–0.4)
Eos: 1 %
HIV Screen 4th Generation wRfx: NONREACTIVE
Hematocrit: 37.4 % (ref 34.0–46.6)
Hemoglobin: 13.1 g/dL (ref 11.1–15.9)
Hepatitis B Surface Ag: NEGATIVE
Immature Grans (Abs): 0 10*3/uL (ref 0.0–0.1)
Immature Granulocytes: 0 %
Lymphocytes Absolute: 2.2 10*3/uL (ref 0.7–3.1)
Lymphs: 24 %
MCH: 31.6 pg (ref 26.6–33.0)
MCHC: 35 g/dL (ref 31.5–35.7)
MCV: 90 fL (ref 79–97)
Monocytes Absolute: 0.5 10*3/uL (ref 0.1–0.9)
Monocytes: 5 %
Neutrophils Absolute: 6.5 10*3/uL (ref 1.4–7.0)
Neutrophils: 70 %
Platelets: 432 10*3/uL (ref 150–450)
RBC: 4.15 x10E6/uL (ref 3.77–5.28)
RDW: 12.5 % (ref 11.7–15.4)
RPR Ser Ql: NONREACTIVE
Rh Factor: NEGATIVE
Rubella Antibodies, IGG: <0.9 index — ABNORMAL LOW (ref >0.99)
WBC: 9.3 10*3/uL (ref 3.4–10.8)

## 2019-04-26 LAB — COMPREHENSIVE METABOLIC PANEL
ALT: 9 IU/L (ref 0–32)
AST: 14 IU/L (ref 0–40)
Albumin/Globulin Ratio: 1.4 (ref 1.2–2.2)
Albumin: 4 g/dL (ref 3.9–5.0)
Alkaline Phosphatase: 68 IU/L (ref 39–117)
BUN/Creatinine Ratio: 13 (ref 9–23)
BUN: 8 mg/dL (ref 6–20)
Bilirubin Total: 0.2 mg/dL (ref 0.0–1.2)
CO2: 19 mmol/L — ABNORMAL LOW (ref 20–29)
Calcium: 9.7 mg/dL (ref 8.7–10.2)
Chloride: 102 mmol/L (ref 96–106)
Creatinine, Ser: 0.62 mg/dL (ref 0.57–1.00)
GFR calc Af Amer: 144 mL/min/{1.73_m2} (ref >59)
GFR calc non Af Amer: 125 mL/min/{1.73_m2} (ref >59)
Globulin, Total: 2.9 g/dL (ref 1.5–4.5)
Glucose: 94 mg/dL (ref 65–99)
Potassium: 4.3 mmol/L (ref 3.5–5.2)
Sodium: 135 mmol/L (ref 134–144)
Total Protein: 6.9 g/dL (ref 6.0–8.5)

## 2019-04-26 LAB — HEMOGLOBIN A1C
Est. average glucose Bld gHb Est-mCnc: 97 mg/dL
Hgb A1c MFr Bld: 5 % (ref 4.8–5.6)

## 2019-04-26 LAB — PROTEIN / CREATININE RATIO, URINE
Creatinine, Urine: 116.6 mg/dL
Protein, Ur: 6.1 mg/dL
Protein/Creat Ratio: 52 mg/g creat (ref 0–200)

## 2019-04-26 LAB — TSH: TSH: 0.051 u[IU]/mL — ABNORMAL LOW (ref 0.450–4.500)

## 2019-04-26 LAB — HEPATITIS C ANTIBODY: Hep C Virus Ab: <0.1 s/co ratio (ref 0.0–0.9)

## 2019-04-27 LAB — URINE CULTURE, OB REFLEX

## 2019-04-27 LAB — CULTURE, OB URINE

## 2019-04-29 ENCOUNTER — Encounter: Payer: Self-pay | Admitting: *Deleted

## 2019-04-29 ENCOUNTER — Telehealth: Payer: Self-pay | Admitting: Family Medicine

## 2019-04-29 LAB — CYTOLOGY - PAP
Chlamydia: NEGATIVE
Comment: NEGATIVE
Comment: NORMAL
Diagnosis: NEGATIVE
Neisseria Gonorrhea: NEGATIVE

## 2019-04-29 NOTE — Telephone Encounter (Signed)
Called patient and reviewed test results. Patient was asking about TSH levels. Told her I will reach out to the provider and we will be in touch if further testing is needed. Patient verbalized understanding.

## 2019-04-29 NOTE — Telephone Encounter (Signed)
The patient called in stating she would like a call back to explain her test results that she received in mychart.

## 2019-05-02 ENCOUNTER — Telehealth: Payer: Self-pay | Admitting: *Deleted

## 2019-05-02 NOTE — Telephone Encounter (Signed)
-----   Message from Hermina Staggers, MD sent at 05/02/2019  1:12 PM EDT ----- Please add Free T3 and T4 to prenatal labs.  Thanks Casimiro Needle

## 2019-05-02 NOTE — Telephone Encounter (Signed)
I checked with lab and they cannot add the tests , previous blood is discarded now.  I informed Dr.Ervin and he would like her to come in for a lab draw.  I called Julia Potter and informed her Dr.Ervin would like to do additional blood tests for thyroid studies and registrar will contact her with appointment. She already has [facility] appointment on 05/06/19 and I informed her I will ask them to try to do around that. She voices understanding. Deakin Lacek,RN

## 2019-05-06 ENCOUNTER — Other Ambulatory Visit: Payer: Self-pay

## 2019-05-06 ENCOUNTER — Other Ambulatory Visit: Payer: Medicaid Other

## 2019-05-06 ENCOUNTER — Other Ambulatory Visit: Payer: Self-pay | Admitting: Obstetrics and Gynecology

## 2019-05-06 ENCOUNTER — Ambulatory Visit (INDEPENDENT_AMBULATORY_CARE_PROVIDER_SITE_OTHER): Payer: Medicaid Other | Admitting: Clinical

## 2019-05-06 DIAGNOSIS — O0990 Supervision of high risk pregnancy, unspecified, unspecified trimester: Secondary | ICD-10-CM

## 2019-05-06 DIAGNOSIS — Z658 Other specified problems related to psychosocial circumstances: Secondary | ICD-10-CM

## 2019-05-06 NOTE — BH Specialist Note (Signed)
Integrated Behavioral Health Initial Visit  MRN: [id number] Name: Julia Potter  Number of Integrated Behavioral Health Clinician visits:: 1/6 Session Start time: 1:15  Session End time: 1:41 Total time: 26  Type of Service: Integrated Behavioral Health- Individual/Family Interpretor:No. Interpretor Name and Language: n/a   Warm Hand Off Completed.       SUBJECTIVE: Julia Potter is a 29 y.o. female accompanied by n/a Patient was referred by Nettie Elm, MD for hx of depression, anxiety. Patient reports the following symptoms/concerns: Pt states her primary goal is to have a healthy pregnancy and healthy baby; primary concern is worry after having a history of miscarriages. Pt uses meditation apps, with a focus on overall health and wellbeing, to manage stress.  Duration of problem: Current pregnancy; Severity of problem: mild  OBJECTIVE: Mood: Anxious and Affect: Appropriate Risk of harm to self or others: No plan to harm self or others  LIFE CONTEXT: Family and Social: Pt lives with her 11yo daughter; family and friends supportive School/Work: - Self-Care: meditation, warm baths, positive self-talk Life Changes: Current pregnancy, after previous losses  GOALS ADDRESSED: Patient will: 1. Reduce symptoms of: anxiety and stress 2. Increase knowledge and/or ability of: healthy habits and self-management skills  3. Demonstrate ability to: Increase healthy adjustment to current life circumstances  INTERVENTIONS: Interventions utilized: Mindfulness or Management consultant and Psychoeducation and/or Health Education  Standardized Assessments completed: GAD-7 and PHQ 9  ASSESSMENT: Patient currently experiencing Psychosocial stress.   Patient may benefit from psychoeducation and brief therapeutic interventions regarding coping with symptoms of anxiety with life stress .  PLAN: 1. Follow up with behavioral health clinician on : One month 2. Behavioral recommendations:   -CALM relaxation breathing exercise twice daily (morning; evening) for as long as remain helpful -Continue using self-coping strategies that have helped with stress management in the past -Continue taking prenatal vitamin daily 3. Referral(s): Integrated Behavioral Health Services (In Clinic) 4. "From scale of 1-10, how likely are you to follow plan?": 10  Rae Lips, LCSW  Depression screen [facility] 2/9 04/18/2019 10/28/2016 07/21/2016 12/02/2015 09/14/2015  Decreased Interest 0 2 2 0 0  Down, Depressed, Hopeless 1 2 3  0 0  PHQ - 2 Score 1 4 5  0 0  Altered sleeping 0 3 2 - -  Tired, decreased energy 1 2 2  - -  Change in appetite 1 2 3  - -  Feeling bad or failure about yourself  0 2 1 - -  Trouble concentrating 0 0 1 - -  Moving slowly or fidgety/restless 0 2 2 - -  Suicidal thoughts 0 0 2 - -  PHQ-9 Score 3 15 18  - -   GAD 7 : Generalized Anxiety Score 04/18/2019 10/28/2016 07/21/2016  Nervous, Anxious, on Edge 1 2 3   Control/stop worrying 0 3 3  Worry too much - different things 1 2 3   Trouble relaxing 1 2 3   Restless 0 2 3  Easily annoyed or irritable 1 2 3   Afraid - awful might happen 1 2 3   Total GAD 7 Score 5 15 21

## 2019-05-06 NOTE — Patient Instructions (Signed)

## 2019-05-07 ENCOUNTER — Encounter: Payer: Self-pay | Admitting: *Deleted

## 2019-05-07 LAB — T3, FREE: T3, Free: 2.8 pg/mL (ref 2.0–4.4)

## 2019-05-07 LAB — T4, FREE: Free T4: 0.82 ng/dL (ref 0.82–1.77)

## 2019-05-08 ENCOUNTER — Encounter: Payer: Self-pay | Admitting: Obstetrics and Gynecology

## 2019-05-08 ENCOUNTER — Telehealth: Payer: Self-pay | Admitting: *Deleted

## 2019-05-08 DIAGNOSIS — R7989 Other specified abnormal findings of blood chemistry: Secondary | ICD-10-CM | POA: Insufficient documentation

## 2019-05-08 NOTE — Telephone Encounter (Signed)
Pt left VM message stating that she has seen her thyroid test results in Mychart but doesn't know what they mean. Please call back. I called pt after discussion with Dr. Alysia Penna. I informed pt that her thyroid function tests were normal. Dr. Alysia Penna will send her a MyChart message regarding these results with his recommendations. Pt voiced understanding.

## 2019-05-15 ENCOUNTER — Encounter: Payer: Self-pay | Admitting: *Deleted

## 2019-05-23 ENCOUNTER — Encounter: Payer: Self-pay | Admitting: Obstetrics & Gynecology

## 2019-05-23 ENCOUNTER — Other Ambulatory Visit: Payer: Self-pay

## 2019-05-23 ENCOUNTER — Ambulatory Visit (INDEPENDENT_AMBULATORY_CARE_PROVIDER_SITE_OTHER): Payer: Medicaid Other | Admitting: Obstetrics & Gynecology

## 2019-05-23 VITALS — BP 128/82 | HR 101 | Wt 210.5 lb

## 2019-05-23 DIAGNOSIS — O0992 Supervision of high risk pregnancy, unspecified, second trimester: Secondary | ICD-10-CM | POA: Diagnosis not present

## 2019-05-23 DIAGNOSIS — Z6841 Body Mass Index (BMI) 40.0 and over, adult: Secondary | ICD-10-CM

## 2019-05-23 DIAGNOSIS — O09212 Supervision of pregnancy with history of pre-term labor, second trimester: Secondary | ICD-10-CM

## 2019-05-23 DIAGNOSIS — E669 Obesity, unspecified: Secondary | ICD-10-CM

## 2019-05-23 DIAGNOSIS — O0990 Supervision of high risk pregnancy, unspecified, unspecified trimester: Secondary | ICD-10-CM

## 2019-05-23 DIAGNOSIS — O99210 Obesity complicating pregnancy, unspecified trimester: Secondary | ICD-10-CM

## 2019-05-23 DIAGNOSIS — Z8751 Personal history of pre-term labor: Secondary | ICD-10-CM

## 2019-05-23 DIAGNOSIS — Z3A16 16 weeks gestation of pregnancy: Secondary | ICD-10-CM

## 2019-05-23 DIAGNOSIS — O99212 Obesity complicating pregnancy, second trimester: Secondary | ICD-10-CM

## 2019-05-23 NOTE — Progress Notes (Signed)
   PRENATAL VISIT NOTE  Subjective:  Julia Potter is a 29 y.o. [id number] at [redacted]w[redacted]d being seen today for ongoing prenatal care.  She is currently monitored for the following issues for this high-risk pregnancy and has Psychiatric illness; Hirsutism; BMI 40.0-44.9, adult (HCC); Supervision of high risk pregnancy, antepartum; History of pregnancy induced hypertension; Obesity; Obesity in pregnancy; Herpes; History of multiple miscarriages; Rh negative state in antepartum period; History of preterm delivery; History of PCOS; and Abnormal TSH on their problem list.  Patient reports heartburn and nausea.  Contractions: Not present. Vag. Bleeding: None.  Movement: Absent. Denies leaking of fluid.   The following portions of the patient's history were reviewed and updated as appropriate: allergies, current medications, past family history, past medical history, past social history, past surgical history and problem list.   Objective:   Vitals:   05/23/19 0903 05/23/19 0912  BP: (!) 144/86 128/82  Pulse: (!) 112 (!) 101  Weight: 210 lb 8 oz (95.5 kg)     Fetal Status: Fetal Heart Rate (bpm): 145 Fundal Height: 16 cm Movement: Absent     General:  Alert, oriented and cooperative. Patient is in no acute distress.  Skin: Skin is warm and dry. No rash noted.   Cardiovascular: Normal heart rate noted  Respiratory: Normal respiratory effort, no problems with respiration noted  Abdomen: Soft, gravid, appropriate for gestational age.  Pain/Pressure: Absent     Pelvic: Cervical exam deferred        Extremities: Normal range of motion.  Edema: None  Mental Status: Normal mood and affect. Normal behavior. Normal judgment and thought content.   Assessment and Plan:  Pregnancy: G5P0131 at [redacted]w[redacted]d 1. BMI 40.0-44.9, adult (HCC) Body mass index is 38.5 kg/m.   2. Supervision of high risk pregnancy, antepartum  - AFP, Serum, Open Spina Bifida 16 week screenings  3. Obesity in pregnancy   4. History  of preterm delivery Preeclampsia and IUGR 34 weeks  Preterm labor symptoms and general obstetric precautions including but not limited to vaginal bleeding, contractions, leaking of fluid and fetal movement were reviewed in detail with the patient. Please refer to After Visit Summary for other counseling recommendations.   Return in about 4 weeks (around 06/20/2019) for virtual.  Future Appointments  Date Time Provider Department Center  05/23/2019  9:55 AM Adam Phenix, MD [facility] WOC  06/05/2019  3:45 PM [facility] HEALTH CLINICIAN [facility] WOC  06/13/2019  8:15 AM [facility] NURSE WH-MFC MFC-US  06/13/2019  8:15 AM [facility]FC [place] 2 WH-MFCUS MFC-US    Scheryl Darter, MD

## 2019-05-23 NOTE — Patient Instructions (Signed)

## 2019-05-26 LAB — AFP, SERUM, OPEN SPINA BIFIDA
AFP MoM: 1.29
AFP Value: 39.7 ng/mL
Gest. Age on Collection Date: 16 weeks
Maternal Age At EDD: 27.2 yr
OSBR Risk 1 IN: 9894
Test Results:: NEGATIVE
Weight: 210 [lb_av]

## 2019-05-28 ENCOUNTER — Encounter: Payer: Self-pay | Admitting: *Deleted

## 2019-06-05 ENCOUNTER — Other Ambulatory Visit: Payer: Self-pay

## 2019-06-05 ENCOUNTER — Ambulatory Visit (INDEPENDENT_AMBULATORY_CARE_PROVIDER_SITE_OTHER): Payer: Medicaid Other | Admitting: Clinical

## 2019-06-05 DIAGNOSIS — Z658 Other specified problems related to psychosocial circumstances: Secondary | ICD-10-CM

## 2019-06-05 DIAGNOSIS — Z3A00 Weeks of gestation of pregnancy not specified: Secondary | ICD-10-CM

## 2019-06-05 DIAGNOSIS — O99340 Other mental disorders complicating pregnancy, unspecified trimester: Secondary | ICD-10-CM | POA: Diagnosis not present

## 2019-06-05 DIAGNOSIS — F99 Mental disorder, not otherwise specified: Secondary | ICD-10-CM | POA: Diagnosis not present

## 2019-06-05 NOTE — BH Specialist Note (Signed)
Integrated Behavioral Health via Telemedicine Video Visit  06/05/2019 AKI BURDIN [id number]  Number of Integrated Behavioral Health visits: 2 Session Start time: 3:50  Session End time: 4:16 Total time: 26  Referring Provider: Nettie Elm, MD Type of Visit: Video Patient/Family location: Home  [facility] Provider location: Center for [facility] at [facility]  All persons participating in visit: Patient Amneet Augustine and [facility] Ashleigh Arya    Confirmed patient's address: Yes  Confirmed patient's phone number: Yes  Any changes to demographics: No   Confirmed patient's insurance: Yes  Any changes to patient's insurance: No   Discussed confidentiality: Yes   I connected with Candis I Maynor  by a video enabled telemedicine application and verified that I am speaking with the correct person using two identifiers.     I discussed the limitations of evaluation and management by telemedicine and the availability of in person appointments.  I discussed that the purpose of this visit is to provide behavioral health care while limiting exposure to the novel coronavirus.   Discussed there is a possibility of technology failure and discussed alternative modes of communication if that failure occurs.  I discussed that engaging in this video visit, they consent to the provision of behavioral healthcare and the services will be billed under their insurance.  Patient and/or legal guardian expressed understanding and consented to video visit: Yes   PRESENTING CONCERNS: Patient and/or family reports the following symptoms/concerns: Pt states her primary concern today is worry over pregnancy after 3 previous miscarriages; is using self-coping strategies daily, and looks forward to reassurance of healthy baby in upcoming ultrasound. Duration of problem: Current pregnancy; Severity of problem: moderate  STRENGTHS (Protective Factors/Coping Skills): Good social  support; uses coping strategies effectively  GOALS ADDRESSED: Patient will: 1.  Reduce symptoms of: anxiety and stress  2.  Increase knowledge and/or ability of: self-management skills  3.  Demonstrate ability to: Increase healthy adjustment to current life circumstances  INTERVENTIONS: Interventions utilized:  Brief CBT Standardized Assessments completed: Not Needed  ASSESSMENT: Patient currently experiencing Psychosocial stress.   Patient may benefit from continued psychoeducation and brief therapeutic interventions regarding coping with symptoms of anxiety and stress .  PLAN: 1. Follow up with behavioral health clinician on : Three weeks 2. Behavioral recommendations:  -Continue taking prenatal vitamin daily -Continue using CALM breathing exercises twice daily, along with self-coping strategies that have helped in the past -Continue prioritizing healthy sleep daily, as discussed -Consider adding Worry Time strategy (up to 15 minutes daily) to prioritize life stressors: What's within your realm of control vs. What's out of your control 3. Referral(s): Integrated [employer] (In Clinic)  I discussed the assessment and treatment plan with the patient and/or parent/guardian. They were provided an opportunity to ask questions and all were answered. They agreed with the plan and demonstrated an understanding of the instructions.   They were advised to call back or seek an in-person evaluation if the symptoms worsen or if the condition fails to improve as anticipated.  Valetta Close Lisha Vitale

## 2019-06-13 ENCOUNTER — Ambulatory Visit (HOSPITAL_COMMUNITY): Payer: Medicaid Other | Attending: Obstetrics and Gynecology

## 2019-06-13 ENCOUNTER — Encounter: Payer: Self-pay | Admitting: *Deleted

## 2019-06-13 ENCOUNTER — Other Ambulatory Visit: Payer: Self-pay | Admitting: *Deleted

## 2019-06-13 ENCOUNTER — Ambulatory Visit: Payer: Medicaid Other | Admitting: *Deleted

## 2019-06-13 ENCOUNTER — Other Ambulatory Visit: Payer: Self-pay

## 2019-06-13 DIAGNOSIS — Z362 Encounter for other antenatal screening follow-up: Secondary | ICD-10-CM

## 2019-06-13 DIAGNOSIS — O360120 Maternal care for anti-D [Rh] antibodies, second trimester, not applicable or unspecified: Secondary | ICD-10-CM

## 2019-06-13 DIAGNOSIS — O99210 Obesity complicating pregnancy, unspecified trimester: Secondary | ICD-10-CM | POA: Insufficient documentation

## 2019-06-13 DIAGNOSIS — Z6791 Unspecified blood type, Rh negative: Secondary | ICD-10-CM

## 2019-06-13 DIAGNOSIS — Z363 Encounter for antenatal screening for malformations: Secondary | ICD-10-CM | POA: Diagnosis not present

## 2019-06-13 DIAGNOSIS — B009 Herpesviral infection, unspecified: Secondary | ICD-10-CM | POA: Diagnosis not present

## 2019-06-13 DIAGNOSIS — Z8759 Personal history of other complications of pregnancy, childbirth and the puerperium: Secondary | ICD-10-CM | POA: Diagnosis not present

## 2019-06-13 DIAGNOSIS — N96 Recurrent pregnancy loss: Secondary | ICD-10-CM | POA: Diagnosis not present

## 2019-06-13 DIAGNOSIS — O0990 Supervision of high risk pregnancy, unspecified, unspecified trimester: Secondary | ICD-10-CM | POA: Insufficient documentation

## 2019-06-13 DIAGNOSIS — O99212 Obesity complicating pregnancy, second trimester: Secondary | ICD-10-CM

## 2019-06-13 DIAGNOSIS — O09292 Supervision of pregnancy with other poor reproductive or obstetric history, second trimester: Secondary | ICD-10-CM

## 2019-06-13 DIAGNOSIS — E669 Obesity, unspecified: Secondary | ICD-10-CM | POA: Diagnosis not present

## 2019-06-13 DIAGNOSIS — O26899 Other specified pregnancy related conditions, unspecified trimester: Secondary | ICD-10-CM | POA: Diagnosis not present

## 2019-06-13 DIAGNOSIS — Z3A19 19 weeks gestation of pregnancy: Secondary | ICD-10-CM

## 2019-06-13 DIAGNOSIS — O09212 Supervision of pregnancy with history of pre-term labor, second trimester: Secondary | ICD-10-CM | POA: Diagnosis not present

## 2019-06-20 ENCOUNTER — Telehealth (INDEPENDENT_AMBULATORY_CARE_PROVIDER_SITE_OTHER): Payer: Medicaid Other | Admitting: Obstetrics and Gynecology

## 2019-06-20 ENCOUNTER — Encounter: Payer: Self-pay | Admitting: Obstetrics and Gynecology

## 2019-06-20 ENCOUNTER — Other Ambulatory Visit: Payer: Self-pay

## 2019-06-20 VITALS — BP 126/78 | HR 98

## 2019-06-20 DIAGNOSIS — R7989 Other specified abnormal findings of blood chemistry: Secondary | ICD-10-CM

## 2019-06-20 DIAGNOSIS — Z6791 Unspecified blood type, Rh negative: Secondary | ICD-10-CM

## 2019-06-20 DIAGNOSIS — O99212 Obesity complicating pregnancy, second trimester: Secondary | ICD-10-CM

## 2019-06-20 DIAGNOSIS — Z8751 Personal history of pre-term labor: Secondary | ICD-10-CM

## 2019-06-20 DIAGNOSIS — O26899 Other specified pregnancy related conditions, unspecified trimester: Secondary | ICD-10-CM

## 2019-06-20 DIAGNOSIS — Z8759 Personal history of other complications of pregnancy, childbirth and the puerperium: Secondary | ICD-10-CM

## 2019-06-20 DIAGNOSIS — O26892 Other specified pregnancy related conditions, second trimester: Secondary | ICD-10-CM

## 2019-06-20 DIAGNOSIS — O0990 Supervision of high risk pregnancy, unspecified, unspecified trimester: Secondary | ICD-10-CM

## 2019-06-20 DIAGNOSIS — B009 Herpesviral infection, unspecified: Secondary | ICD-10-CM

## 2019-06-20 DIAGNOSIS — O99210 Obesity complicating pregnancy, unspecified trimester: Secondary | ICD-10-CM

## 2019-06-20 DIAGNOSIS — O0992 Supervision of high risk pregnancy, unspecified, second trimester: Secondary | ICD-10-CM

## 2019-06-20 DIAGNOSIS — Z3A20 20 weeks gestation of pregnancy: Secondary | ICD-10-CM

## 2019-06-20 DIAGNOSIS — E669 Obesity, unspecified: Secondary | ICD-10-CM

## 2019-06-20 MED ORDER — ONDANSETRON 4 MG PO TBDP
4.0000 mg | ORAL_TABLET | Freq: Three times a day (TID) | ORAL | 0 refills | Status: DC | PRN
Start: 2019-06-20 — End: 2019-07-19

## 2019-06-20 NOTE — BH Specialist Note (Signed)
Integrated Behavioral Health via Telemedicine Video Visit  06/20/2019 [name] [id number]  Number of Integrated Behavioral Health visits: 3 Session Start time: 3:59  Session End time: 4:02 Total time: 3  Referring Provider: Nettie Elm, MD Type of Visit: Video Patient/Family location: Home [facility] Provider location: [facility]  All persons participating in visit: Patient Julia Potter and [facility] Hewitt Garner   Pt states she has no concerns today, after positive ultrasound results(healthy baby, with lots of movement); pt agrees to contact [facility] [place] at [phone] at Center for [employer] at [facility] if symptoms of anxiety increase again throughout remainder of pregnancy.   Valetta Close Eber Ferrufino  Depression screen [facility] 2/9 06/20/2019 05/23/2019 05/06/2019 04/18/2019 10/28/2016  Decreased Interest 0 0 1 0 2  Down, Depressed, Hopeless 0 0 1 1 2   PHQ - 2 Score 0 0 2 1 4   Altered sleeping 0 1 1 0 3  Tired, decreased energy 1 0 1 1 2   Change in appetite 0 0 0 1 2  Feeling bad or failure about yourself  0 0 0 0 2  Trouble concentrating 0 0 0 0 0  Moving slowly or fidgety/restless 0 0 0 0 2  Suicidal thoughts 0 0 0 0 0  PHQ-9 Score 1 1 4 3 15   Some recent data might be hidden   GAD 7 : Generalized Anxiety Score 06/20/2019 05/23/2019 05/06/2019 04/18/2019  Nervous, Anxious, on Edge 0 0 1 1  Control/stop worrying 0 1 1 0  Worry too much - different things 0 0 0 1  Trouble relaxing 0 0 0 1  Restless 0 0 0 0  Easily annoyed or irritable 0 1 1 1   Afraid - awful might happen 0 1 1 1   Total GAD 7 Score 0 3 4 5

## 2019-06-20 NOTE — Progress Notes (Signed)
I connected with  Synetta Fail on 06/20/19 at  8:35 AM EDT by telephone and verified that I am speaking with the correct person using two identifiers.   I discussed the limitations, risks, security and privacy concerns of performing an evaluation and management service by telephone and the availability of in person appointments. I also discussed with the patient that there may be a patient responsible charge related to this service. The patient expressed understanding and agreed to proceed.  Henrietta Dine, CMA 06/20/2019  8:35 AM

## 2019-06-20 NOTE — Progress Notes (Signed)
   OBSTETRICS PRENATAL VIRTUAL VISIT ENCOUNTER NOTE  Provider location: Center for [facility] at [facility]   I connected with Synetta Fail on 06/20/19 at  8:35 AM EDT by MyChart Video Encounter at home and verified that I am speaking with the correct person using two identifiers.   I discussed the limitations, risks, security and privacy concerns of performing an evaluation and management service virtually and the availability of in person appointments. I also discussed with the patient that there may be a patient responsible charge related to this service. The patient expressed understanding and agreed to proceed. Subjective:  Julia Potter is a 29 y.o. [id number] at [redacted]w[redacted]d being seen today for ongoing prenatal care.  She is currently monitored for the following issues for this high-risk pregnancy and has Psychiatric illness; Hirsutism; BMI 40.0-44.9, adult (HCC); Supervision of high risk pregnancy, antepartum; History of pregnancy induced hypertension; Obesity; Obesity in pregnancy; Herpes; History of multiple miscarriages; Rh negative state in antepartum period; History of preterm delivery; History of PCOS; and Abnormal TSH on their problem list.  Patient reports nausea.  Contractions: Not present. Vag. Bleeding: None.  Movement: Present. Denies any leaking of fluid.   The following portions of the patient's history were reviewed and updated as appropriate: allergies, current medications, past family history, past medical history, past social history, past surgical history and problem list.   Objective:   Vitals:   06/20/19 0839  BP: 126/78  Pulse: 98    Fetal Status:     Movement: Present     General:  Alert, oriented and cooperative. Patient is in no acute distress.  Respiratory: Normal respiratory effort, no problems with respiration noted  Mental Status: Normal mood and affect. Normal behavior. Normal judgment and thought content.  Rest of physical exam  deferred due to type of encounter  Imaging:   Assessment and Plan:  Pregnancy: G5P0131 at [redacted]w[redacted]d  1. Supervision of high risk pregnancy, antepartum Anatomy incomplete, has f/u scheduled Patient has not had COVID vaccine. Reviewed risks/benefits in pregnancy/breastfeeding and gave info. She declines.  2. History of pregnancy induced hypertension  3. Obesity in pregnancy Cont baby ASA  4. Rh negative state in antepartum period Will need Rho gam  5. Herpes Will need ppx 34 weeks  6. History of preterm delivery IOL for PIH @ 34 weeks  7. Abnormal TSH Recheck 28 weeks  Preterm labor symptoms and general obstetric precautions including but not limited to vaginal bleeding, contractions, leaking of fluid and fetal movement were reviewed in detail with the patient. I discussed the assessment and treatment plan with the patient. The patient was provided an opportunity to ask questions and all were answered. The patient agreed with the plan and demonstrated an understanding of the instructions. The patient was advised to call back or seek an in-person office evaluation/go to [facility] at [facility] for any urgent or concerning symptoms. Please refer to After Visit Summary for other counseling recommendations.   I provided 15 minutes of face-to-face time during this encounter.  Return in about 4 weeks (around 07/18/2019) for high OB, virtual.  Future Appointments  Date Time Provider Department Center  06/26/2019  3:45 PM [facility] HEALTH CLINICIAN [facility]  07/11/2019  7:45 AM [facility] NURSE [facility]  07/11/2019  7:45 AM [facility] US5 [facility]    Conan Bowens, MD Center for [facility], [facility]

## 2019-06-26 ENCOUNTER — Ambulatory Visit (INDEPENDENT_AMBULATORY_CARE_PROVIDER_SITE_OTHER): Payer: Medicaid Other | Admitting: Clinical

## 2019-06-26 ENCOUNTER — Other Ambulatory Visit: Payer: Self-pay

## 2019-06-26 DIAGNOSIS — Z658 Other specified problems related to psychosocial circumstances: Secondary | ICD-10-CM

## 2019-07-11 ENCOUNTER — Ambulatory Visit: Payer: Medicaid Other | Attending: Obstetrics and Gynecology

## 2019-07-11 ENCOUNTER — Other Ambulatory Visit: Payer: Self-pay

## 2019-07-11 ENCOUNTER — Other Ambulatory Visit: Payer: Self-pay | Admitting: *Deleted

## 2019-07-11 ENCOUNTER — Ambulatory Visit: Payer: Medicaid Other | Admitting: *Deleted

## 2019-07-11 DIAGNOSIS — O99212 Obesity complicating pregnancy, second trimester: Secondary | ICD-10-CM | POA: Diagnosis not present

## 2019-07-11 DIAGNOSIS — Z6791 Unspecified blood type, Rh negative: Secondary | ICD-10-CM

## 2019-07-11 DIAGNOSIS — O09212 Supervision of pregnancy with history of pre-term labor, second trimester: Secondary | ICD-10-CM | POA: Diagnosis not present

## 2019-07-11 DIAGNOSIS — O99210 Obesity complicating pregnancy, unspecified trimester: Secondary | ICD-10-CM | POA: Diagnosis not present

## 2019-07-11 DIAGNOSIS — O26899 Other specified pregnancy related conditions, unspecified trimester: Secondary | ICD-10-CM | POA: Diagnosis not present

## 2019-07-11 DIAGNOSIS — Z8759 Personal history of other complications of pregnancy, childbirth and the puerperium: Secondary | ICD-10-CM | POA: Diagnosis not present

## 2019-07-11 DIAGNOSIS — B009 Herpesviral infection, unspecified: Secondary | ICD-10-CM | POA: Insufficient documentation

## 2019-07-11 DIAGNOSIS — N96 Recurrent pregnancy loss: Secondary | ICD-10-CM

## 2019-07-11 DIAGNOSIS — O360120 Maternal care for anti-D [Rh] antibodies, second trimester, not applicable or unspecified: Secondary | ICD-10-CM | POA: Diagnosis not present

## 2019-07-11 DIAGNOSIS — O09292 Supervision of pregnancy with other poor reproductive or obstetric history, second trimester: Secondary | ICD-10-CM

## 2019-07-11 DIAGNOSIS — O99213 Obesity complicating pregnancy, third trimester: Secondary | ICD-10-CM

## 2019-07-11 DIAGNOSIS — E669 Obesity, unspecified: Secondary | ICD-10-CM | POA: Diagnosis not present

## 2019-07-11 DIAGNOSIS — O0990 Supervision of high risk pregnancy, unspecified, unspecified trimester: Secondary | ICD-10-CM | POA: Insufficient documentation

## 2019-07-11 DIAGNOSIS — Z362 Encounter for other antenatal screening follow-up: Secondary | ICD-10-CM | POA: Diagnosis not present

## 2019-07-11 DIAGNOSIS — Z3A23 23 weeks gestation of pregnancy: Secondary | ICD-10-CM

## 2019-07-19 ENCOUNTER — Encounter: Payer: Self-pay | Admitting: Obstetrics & Gynecology

## 2019-07-19 ENCOUNTER — Telehealth (INDEPENDENT_AMBULATORY_CARE_PROVIDER_SITE_OTHER): Payer: Medicaid Other | Admitting: Obstetrics & Gynecology

## 2019-07-19 VITALS — BP 128/82

## 2019-07-19 DIAGNOSIS — O0990 Supervision of high risk pregnancy, unspecified, unspecified trimester: Secondary | ICD-10-CM

## 2019-07-19 DIAGNOSIS — Z8759 Personal history of other complications of pregnancy, childbirth and the puerperium: Secondary | ICD-10-CM

## 2019-07-19 DIAGNOSIS — O99612 Diseases of the digestive system complicating pregnancy, second trimester: Secondary | ICD-10-CM

## 2019-07-19 DIAGNOSIS — Z6791 Unspecified blood type, Rh negative: Secondary | ICD-10-CM

## 2019-07-19 DIAGNOSIS — O0992 Supervision of high risk pregnancy, unspecified, second trimester: Secondary | ICD-10-CM

## 2019-07-19 DIAGNOSIS — K219 Gastro-esophageal reflux disease without esophagitis: Secondary | ICD-10-CM

## 2019-07-19 DIAGNOSIS — Z3A24 24 weeks gestation of pregnancy: Secondary | ICD-10-CM

## 2019-07-19 MED ORDER — FAMOTIDINE 20 MG PO TABS: 20.0000 mg | ORAL_TABLET | Freq: Two times a day (BID) | ORAL | 3 refills | Status: DC

## 2019-07-19 NOTE — Progress Notes (Signed)
OBSTETRICS PRENATAL VIRTUAL VISIT ENCOUNTER NOTE  Provider location: Center for [facility]   I connected with Julia Potter on 07/19/19 at  9:15 AM EDT by MyChart Video Encounter at home and verified that I am speaking with the correct person using two identifiers.   I discussed the limitations, risks, security and privacy concerns of performing an evaluation and management service virtually and the availability of in person appointments. I also discussed with the patient that there may be a patient responsible charge related to this service. The patient expressed understanding and agreed to proceed. Subjective:  Julia Potter is a 29 y.o. X8P3825 at [redacted]w[redacted]d being seen today for ongoing prenatal care.  She is currently monitored for the following issues for this high-risk pregnancy and has Psychiatric illness; Hirsutism; BMI 40.0-44.9, adult (HCC); Supervision of high risk pregnancy, antepartum; History of pregnancy induced hypertension; Obesity; Obesity in pregnancy; Herpes; History of multiple miscarriages; Rh negative state in antepartum period; History of preterm delivery; History of PCOS; and Abnormal TSH on their problem list.  Patient reports heartburn.  Contractions: Not present. Vag. Bleeding: None.  Movement: Present. Denies any leaking of fluid.   The following portions of the patient's history were reviewed and updated as appropriate: allergies, current medications, past family history, past medical history, past social history, past surgical history and problem list.   Objective:   Vitals:   07/19/19 0933  BP: 128/82    Fetal Status:     Movement: Present     General:  Alert, oriented and cooperative. Patient is in no acute distress.  Respiratory: Normal respiratory effort, no problems with respiration noted  Mental Status: Normal mood and affect. Normal behavior. Normal judgment and thought content.  Rest of physical exam deferred due to type of  encounter  Imaging: [place] MFM OB FOLLOW UP  Result Date: 07/11/2019 ----------------------------------------------------------------------  OBSTETRICS REPORT                       (Signed Final 07/11/2019 09:08 am) ---------------------------------------------------------------------- Patient Info  ID #:       [id number]                          D.O.B.:  03/15/1990 (29 yrs)  Name:       Julia Potter                Visit Date: 07/11/2019 08:11 am ---------------------------------------------------------------------- Performed By  Attending:        Noralee Space MD        Secondary Phy.:   [facility]                                                             [facility]  Performed By:     Eden Lathe BS      Address:          [address]                    [address]  [place], [place]                                                             [address]  Referred By:      Hermina Staggers        Location[facility]         [facility]                                       [facility]  Ref. Address:     [address]                    [place], [place]                    [address] ---------------------------------------------------------------------- Orders  #  Description                           Code        Ordered By  1  [place] MFM OB FOLLOW UP                   [phone]    Rosana Hoes ----------------------------------------------------------------------  #  Order #                     Accession #                Episode #  1  [id number]                   [id number]                 [id number] ---------------------------------------------------------------------- Indications  [redacted] weeks gestation of pregnancy                Z3A.23  Obesity complicating pregnancy, second         O99.212  trimester (pregravid BMI 38)  Antenatal follow-up for nonvisualized fetal    Z36.2  anatomy  Rh negative state in antepartum                 O36.0190  Poor obstetric history: Previous               O09.299  preeclampsia / eclampsia/gestational HTN  Low Risk NIPS (Negative AFP)(Negative  Horizon)  Poor obstetric history: Previous preterm       O09.219  delivery, antepartum (34 wks pre-e) ---------------------------------------------------------------------- Vital Signs                                                 Height:        5'2" ---------------------------------------------------------------------- Fetal Evaluation  Num Of Fetuses:         1  Fetal Heart Rate(bpm):  138  Cardiac Activity:  Observed  Presentation:           Cephalic  Placenta:               Posterior  P. Cord Insertion:      Visualized  Amniotic Fluid  AFI FV:      Within normal limits                              Largest Pocket(cm)                              4.5 ---------------------------------------------------------------------- Biometry  BPD:      54.8  mm     G. Age:  22w 5d         33  %    CI:        69.46   %    70 - 86                                                          FL/HC:      19.5   %    19.2 - 20.8  HC:      209.9  mm     G. Age:  23w 0d         67  %    HC/AC:      1.17        1.05 - 1.21  AC:      179.6  mm     G. Age:  22w 6d         36  %    FL/BPD:     74.6   %    71 - 87  FL:       40.9  mm     G. Age:  23w 2d         47  %    FL/AC:      22.8   %    20 - 24  HUM:      40.3  mm     G. Age:  24w 3d         65  %  CER:      24.9  mm     G. Age:  22w 6d         49  %  LV:        4.9  mm  CM:        6.1  mm  Est. FW:     554  gm      1 lb 4 oz     43  % ---------------------------------------------------------------------- OB History  Gravidity:    5         Term:   1        Prem:   0        SAB:   3  TOP:          0       Ectopic:  0        Living: 1 ---------------------------------------------------------------------- Gestational Age  LMP:           23w 0d        Date:  01/31/19  EDD:   11/07/19  U/S Today:     23w 0d                                         EDD:   11/07/19  Best:          23w 0d     Det. By:  LMP  (01/31/19)          EDD:   11/07/19 ---------------------------------------------------------------------- Anatomy  Cranium:               Appears normal         Aortic Arch:            Previously seen  Cavum:                 Appears normal         Ductal Arch:            Previously seen  Ventricles:            Appears normal         Diaphragm:              Appears normal  Choroid Plexus:        Appears normal         Stomach:                Appears normal, left                                                                        sided  Cerebellum:            Appears normal         Abdomen:                Appears normal  Posterior Fossa:       Appears normal         Abdominal Wall:         Appears nml (cord                                                                        insert, abd wall)  Nuchal Fold:           Not applicable (>20    Cord Vessels:           Appears normal ([redacted]                         wks GA)                                        vessel cord)  Face:                  Appears normal  Kidneys:                Appear normal                         (orbits and profile)  Lips:                  Appears normal         Bladder:                Appears normal  Thoracic:              Appears normal         Spine:                  Previously seen  Heart:                 Appears normal         Upper Extremities:      Previously seen                         (4CH, axis, and                         situs)  RVOT:                  Appears normal         Lower Extremities:      Previously seen  LVOT:                  Appears normal  Other:  Heels visualized. Nasal bone visualized. ---------------------------------------------------------------------- Cervix Uterus Adnexa  Cervix  Length:           3.22  cm.  Normal appearance by transabdominal scan.  Uterus  No abnormality visualized.  Right Ovary  Within normal limits.  Left  Ovary  Within normal limits.  Cul De Sac  No free fluid seen.  Adnexa  No abnormality visualized. ---------------------------------------------------------------------- Impression  Patient returned for completion of fetal anatomy .Amniotic  fluid is normal and good fetal activity is seen .Fetal growth is  appropriate for gestational age .Fetal anatomical survey was  completed and appears normal.  Maternal obesity imposes limitations on the resolution of  images, and failure to detect fetal anomalies is more common  in obese pregnant women. ---------------------------------------------------------------------- Recommendations  Fetal growth assessment at 32 weeks (appointment was  made). ----------------------------------------------------------------------                  Noralee Spaceavi Shankar, MD Electronically Signed Final Report   07/11/2019 09:08 am ----------------------------------------------------------------------   Assessment and Plan:  Pregnancy: [id number] at 2173w1d 1. Gastroesophageal reflux during pregnancy in second trimester, antepartum Pepcid prescribed. If it does not work, Buyer, retailconsider Protonix. - famotidine (PEPCID) 20 MG tablet; Take 1 tablet (20 mg total) by mouth 2 (two) times daily.  Dispense: 60 tablet; Refill: 3  2. History of pregnancy induced hypertension Stable BP, continue close monitoring  3. Rh negative state in antepartum period Rhogam at next visit  4. Supervision of high risk pregnancy, antepartum Preterm labor symptoms and general obstetric precautions including but not limited to vaginal bleeding, contractions, leaking of fluid and fetal movement were reviewed in detail with the patient. I discussed the assessment and treatment plan with the patient. The patient was provided an opportunity to ask questions and all were answered. The patient  agreed with the plan and demonstrated an understanding of the instructions. The patient was advised to call back or seek an in-person office  evaluation/go to MAU at [facility] for any urgent or concerning symptoms. Please refer to After Visit Summary for other counseling recommendations.   I provided 7 minutes of face-to-face time during this encounter.  Return in about 4 weeks (around 08/16/2019) for 2 hr GTT, 3rd trimester labs, TDap, Rhogam, OFFICE OB Visit.  Future Appointments  Date Time Provider Department Center  09/12/2019  7:30 AM [facility] NURSE [facility]  09/12/2019  7:30 AM [facility] US3 [facility]    Jaynie Collins, MD Center for [employer]

## 2019-07-19 NOTE — Progress Notes (Signed)
Patient reports difficulty with heartburn- has tried Tums with minimal relief

## 2019-07-19 NOTE — Progress Notes (Signed)
I connected with  Synetta Fail on 07/19/19 at  9:15 AM EDT by mychart video and verified that I am speaking with the correct person using two identifiers.   I discussed the limitations, risks, security and privacy concerns of performing an evaluation and management service by telephone and the availability of in person appointments. I also discussed with the patient that there may be a patient responsible charge related to this service. The patient expressed understanding and agreed to proceed.  Marylynn Pearson, RN 07/19/2019  9:30 AM

## 2019-07-19 NOTE — Patient Instructions (Addendum)
Return to office for any scheduled appointments. Call the office or go to the MAU at [facility] if:  You begin to have strong, frequent contractions  Your water breaks.  Sometimes it is a big gush of fluid, sometimes it is just a trickle that keeps getting your panties wet or running down your legs  You have vaginal bleeding.  It is normal to have a small amount of spotting if your cervix was checked.   You do not feel your baby moving like normal.  If you do not, get something to eat and drink and lay down and focus on feeling your baby move.   If your baby is still not moving like normal, you should call the office or go to MAU.  Any other obstetric concerns.    [email or web address] --> your pregnancy, online to do's before your due---> sign up for a childbirth class   TDaP Vaccine Pregnancy Get the Whooping Cough Vaccine While You Are Pregnant (CDC)  It is important for women to get the whooping cough vaccine in the third trimester of each pregnancy. Vaccines are the best way to prevent this disease. There are 2 different whooping cough vaccines. Both vaccines combine protection against whooping cough, tetanus and diphtheria, but they are for different age groups: Tdap: for everyone 11 years or older, including pregnant women  DTaP: for children 2 months through 21 years of age  You need the whooping cough vaccine during each of your pregnancies The recommended time to get the shot is during your 27th through 36th week of pregnancy, preferably during the earlier part of this time period. The Centers for Disease Control and Prevention (CDC) recommends that pregnant women receive the whooping cough vaccine for adolescents and adults (called Tdap vaccine) during the third trimester of each pregnancy. The recommended time to get the shot is during your 27th through 36th week of pregnancy, preferably during the earlier part of this time period. This replaces  the original recommendation that pregnant women get the vaccine only if they had not previously received it. The [employer] of Obstetricians and Gynecologists and the [employer] support this recommendation.  You should get the whooping cough vaccine while pregnant to pass protection to your baby frame support disabled and/or not supported in this browser  Learn why Vernona Rieger decided to get the whooping cough vaccine in her 3rd trimester of pregnancy and how her baby girl was born with some protection against the disease. Also available on YouTube. After receiving the whooping cough vaccine, your body will create protective antibodies (proteins produced by the body to fight off diseases) and pass some of them to your baby before birth. These antibodies provide your baby some short-term protection against whooping cough in early life. These antibodies can also protect your baby from some of the more serious complications that come along with whooping cough. Your protective antibodies are at their highest about 2 weeks after getting the vaccine, but it takes time to pass them to your baby. So the preferred time to get the whooping cough vaccine is early in your third trimester. The amount of whooping cough antibodies in your body decreases over time. That is why CDC recommends you get a whooping cough vaccine during each pregnancy. Doing so allows each of your babies to get the greatest number of protective antibodies from you. This means each of your babies will get the best protection possible against this disease.  Getting  the whooping cough vaccine while pregnant is better than getting the vaccine after you give birth Whooping cough vaccination during pregnancy is ideal so your baby will have short-term protection as soon as he is born. This early protection is important because your baby will not start getting his whooping cough vaccines until he is 2 months old. These first  few months of life are when your baby is at greatest risk for catching whooping cough. This is also when hes at greatest risk for having severe, potentially life-threating complications from the infection. To avoid that gap in protection, it is best to get a whooping cough vaccine during pregnancy. You will then pass protection to your baby before he is born. To continue protecting your baby, he should get whooping cough vaccines starting at 2 months old. You may never have gotten the Tdap vaccine before and did not get it during this pregnancy. If so, you should make sure to get the vaccine immediately after you give birth, before leaving the hospital or birthing center. It will take about 2 weeks before your body develops protection (antibodies) in response to the vaccine. Once you have protection from the vaccine, you are less likely to give whooping cough to your newborn while caring for him. But remember, your baby will still be at risk for catching whooping cough from others. A recent study looked to see how effective Tdap was at preventing whooping cough in babies whose mothers got the vaccine while pregnant or in the hospital after giving birth. The study found that getting Tdap between 27 through 36 weeks of pregnancy is 85% more effective at preventing whooping cough in babies younger than 2 months old. Blood tests cannot tell if you need a whooping cough vaccine There are no blood tests that can tell you if you have enough antibodies in your body to protect yourself or your baby against whooping cough. Even if you have been sick with whooping cough in the past or previously received the vaccine, you still should get the vaccine during each pregnancy. Breastfeeding may pass some protective antibodies onto your baby By breastfeeding, you may pass some antibodies you have made in response to the vaccine to your baby. When you get a whooping cough vaccine during your pregnancy, you will have antibodies  in your breast milk that you can share with your baby as soon as your milk comes in. However, your baby will not get protective antibodies immediately if you wait to get the whooping cough vaccine until after delivering your baby. This is because it takes about 2 weeks for your body to create antibodies. Learn more about the health benefits of breastfeeding.

## 2019-08-12 ENCOUNTER — Other Ambulatory Visit: Payer: Self-pay | Admitting: General Practice

## 2019-08-12 DIAGNOSIS — O0990 Supervision of high risk pregnancy, unspecified, unspecified trimester: Secondary | ICD-10-CM

## 2019-08-16 ENCOUNTER — Other Ambulatory Visit: Payer: Medicaid Other

## 2019-08-16 ENCOUNTER — Ambulatory Visit (INDEPENDENT_AMBULATORY_CARE_PROVIDER_SITE_OTHER): Payer: Medicaid Other | Admitting: Obstetrics and Gynecology

## 2019-08-16 ENCOUNTER — Other Ambulatory Visit: Payer: Self-pay

## 2019-08-16 VITALS — BP 136/89 | HR 118

## 2019-08-16 DIAGNOSIS — Z23 Encounter for immunization: Secondary | ICD-10-CM | POA: Diagnosis not present

## 2019-08-16 DIAGNOSIS — Z6791 Unspecified blood type, Rh negative: Secondary | ICD-10-CM

## 2019-08-16 DIAGNOSIS — Z3A28 28 weeks gestation of pregnancy: Secondary | ICD-10-CM

## 2019-08-16 DIAGNOSIS — Z8759 Personal history of other complications of pregnancy, childbirth and the puerperium: Secondary | ICD-10-CM

## 2019-08-16 DIAGNOSIS — E669 Obesity, unspecified: Secondary | ICD-10-CM

## 2019-08-16 DIAGNOSIS — O0990 Supervision of high risk pregnancy, unspecified, unspecified trimester: Secondary | ICD-10-CM

## 2019-08-16 DIAGNOSIS — Z3182 Encounter for Rh incompatibility status: Secondary | ICD-10-CM

## 2019-08-16 DIAGNOSIS — B009 Herpesviral infection, unspecified: Secondary | ICD-10-CM

## 2019-08-16 DIAGNOSIS — O98513 Other viral diseases complicating pregnancy, third trimester: Secondary | ICD-10-CM

## 2019-08-16 DIAGNOSIS — O0993 Supervision of high risk pregnancy, unspecified, third trimester: Secondary | ICD-10-CM

## 2019-08-16 DIAGNOSIS — O99210 Obesity complicating pregnancy, unspecified trimester: Secondary | ICD-10-CM

## 2019-08-16 DIAGNOSIS — Z8751 Personal history of pre-term labor: Secondary | ICD-10-CM

## 2019-08-16 DIAGNOSIS — Z6841 Body Mass Index (BMI) 40.0 and over, adult: Secondary | ICD-10-CM

## 2019-08-16 MED ORDER — RHO D IMMUNE GLOBULIN 1500 UNIT/2ML IJ SOSY
300.0000 ug | PREFILLED_SYRINGE | Freq: Once | INTRAMUSCULAR | Status: AC
  Administered 2019-08-16: 300 ug via INTRAMUSCULAR

## 2019-08-16 NOTE — Addendum Note (Signed)
Addended by: Faythe Casa on: 08/16/2019 11:37 AM   Modules accepted: Orders

## 2019-08-16 NOTE — Progress Notes (Signed)
Prenatal Visit Note Date: 08/16/2019 [facility]  Subjective:  Julia Potter is a 29 y.o. A2N0539 at [redacted]w[redacted]d being seen today for ongoing prenatal care.  She is currently monitored for the following issues for this high-risk pregnancy and has Psychiatric illness; Hirsutism; BMI 40.0-44.9, adult (HCC); Supervision of high risk pregnancy, antepartum; History of pregnancy induced hypertension; Obesity; Obesity in pregnancy; Herpes; History of multiple miscarriages; Rh negative state in antepartum period; History of preterm delivery; History of PCOS; and Abnormal TSH on their problem list.  Patient reports no complaints.   Contractions: Not present. Vag. Bleeding: None.  Movement: Present. Denies leaking of fluid.   The following portions of the patient's history were reviewed and updated as appropriate: allergies, current medications, past family history, past medical history, past social history, past surgical history and problem list. Problem list updated.  Objective:   Vitals:   08/16/19 0854  BP: 136/89  Pulse: (!) 118    Fetal Status: Fetal Heart Rate (bpm): 141   Movement: Present     General:  Alert, oriented and cooperative. Patient is in no acute distress.  Skin: Skin is warm and dry. No rash noted.   Cardiovascular: Normal heart rate noted  Respiratory: Normal respiratory effort, no problems with respiration noted  Abdomen: Soft, gravid, appropriate for gestational age. Pain/Pressure: Present     Pelvic:  Cervical exam deferred        Extremities: Normal range of motion.  Edema: None  Mental Status: Normal mood and affect. Normal behavior. Normal judgment and thought content.   Urinalysis:      Assessment and Plan:  Pregnancy: G5P0131 at [redacted]w[redacted]d  1. BMI 40.0-44.9, adult (HCC) Scale not working today  2. Supervision of high risk pregnancy, antepartum Routine care. 28wk labs today - TSH  3. History of pregnancy induced hypertension Confirms  on low dose asa  4. Obesity in pregnancy  5. Rh negative state in antepartum period Ab screen and rhogam today  6. Herpes Recommend ppx starting at 34-36wks  Preterm labor symptoms and general obstetric precautions including but not limited to vaginal bleeding, contractions, leaking of fluid and fetal movement were reviewed in detail with the patient. Please refer to After Visit Summary for other counseling recommendations.  Return in about 2 weeks (around 08/30/2019) for low risk, in person.   Corazon Bing, MD

## 2019-08-16 NOTE — Progress Notes (Signed)
28 wk packet given  tdap vaccine given  

## 2019-08-17 LAB — HIV ANTIBODY (ROUTINE TESTING W REFLEX): HIV Screen 4th Generation wRfx: NONREACTIVE

## 2019-08-17 LAB — RPR: RPR Ser Ql: NONREACTIVE

## 2019-08-17 LAB — CBC
Hematocrit: 37.3 % (ref 34.0–46.6)
Hemoglobin: 12.7 g/dL (ref 11.1–15.9)
MCH: 31.8 pg (ref 26.6–33.0)
MCHC: 34 g/dL (ref 31.5–35.7)
MCV: 94 fL (ref 79–97)
Platelets: 337 10*3/uL (ref 150–450)
RBC: 3.99 x10E6/uL (ref 3.77–5.28)
RDW: 13.2 % (ref 11.7–15.4)
WBC: 9.5 10*3/uL (ref 3.4–10.8)

## 2019-08-17 LAB — GLUCOSE TOLERANCE, 2 HOURS W/ 1HR
Glucose, 1 hour: 132 mg/dL (ref 65–179)
Glucose, 2 hour: 91 mg/dL (ref 65–152)
Glucose, Fasting: 72 mg/dL (ref 65–91)

## 2019-08-17 LAB — ANTIBODY SCREEN: Antibody Screen: NEGATIVE

## 2019-08-19 LAB — TSH: TSH: 0.913 u[IU]/mL (ref 0.450–4.500)

## 2019-08-19 LAB — SPECIMEN STATUS REPORT

## 2019-08-30 ENCOUNTER — Ambulatory Visit (INDEPENDENT_AMBULATORY_CARE_PROVIDER_SITE_OTHER): Payer: Medicaid Other | Admitting: Medical

## 2019-08-30 ENCOUNTER — Other Ambulatory Visit: Payer: Self-pay

## 2019-08-30 ENCOUNTER — Encounter: Payer: Self-pay | Admitting: Medical

## 2019-08-30 VITALS — BP 135/85 | HR 99 | Wt 232.9 lb

## 2019-08-30 DIAGNOSIS — Z6791 Unspecified blood type, Rh negative: Secondary | ICD-10-CM

## 2019-08-30 DIAGNOSIS — O99210 Obesity complicating pregnancy, unspecified trimester: Secondary | ICD-10-CM

## 2019-08-30 DIAGNOSIS — O26899 Other specified pregnancy related conditions, unspecified trimester: Secondary | ICD-10-CM

## 2019-08-30 DIAGNOSIS — Z8759 Personal history of other complications of pregnancy, childbirth and the puerperium: Secondary | ICD-10-CM

## 2019-08-30 DIAGNOSIS — Z8751 Personal history of pre-term labor: Secondary | ICD-10-CM

## 2019-08-30 DIAGNOSIS — O0990 Supervision of high risk pregnancy, unspecified, unspecified trimester: Secondary | ICD-10-CM

## 2019-08-30 NOTE — Progress Notes (Signed)
PRENATAL VISIT NOTE  Subjective:  Julia Potter is a 29 y.o. [id number] at [redacted]w[redacted]d being seen today for ongoing prenatal care.  She is currently monitored for the following issues for this high-risk pregnancy and has Psychiatric illness; Hirsutism; BMI 40.0-44.9, adult (HCC); Supervision of high risk pregnancy, antepartum; History of pregnancy induced hypertension; Obesity; Obesity in pregnancy; Herpes; History of multiple miscarriages; Rh negative state in antepartum period; History of preterm delivery; History of PCOS; and Abnormal TSH on their problem list.  Patient reports fatigue, no bleeding, no contractions, no cramping and no leaking.  Contractions: Not present. Vag. Bleeding: None.  Movement: Present. Denies leaking of fluid.    The following portions of the patient's history were reviewed and updated as appropriate: allergies, current medications, past family history, past medical history, past social history, past surgical history and problem list. Problem list updated.  Objective:   Vitals:   08/30/19 1057  BP: (!) 135/85  Pulse: 99  Weight: (!) 232 lb 14.4 oz (105.6 kg)    Fetal Status: Fetal Heart Rate (bpm): 152 Fundal Height: 32 cm Movement: Present     General:  Alert, oriented and cooperative. Patient is in no acute distress.  Skin: Skin is warm and dry. No rash noted.   Cardiovascular: Normal heart rate noted  Respiratory: Normal respiratory effort, no problems with respiration noted  Abdomen: Soft, gravid, appropriate for gestational age.  Pain/Pressure: Present     Pelvic: Cervical exam deferred        Extremities: Normal range of motion.  Edema: None  Mental Status:  Normal mood and affect. Normal behavior. Normal judgment and thought content.   Assessment and Plan:  Pregnancy: [id number] at 101w1d  1. Supervision of high risk pregnancy, antepartum Pregnancy progressing well with no concerns today.  Discussed natural progression of pregnancy and symptoms of  pre-term labor to monitor and when to seek evaluation.  Discussed contraception options after pregnancy- patient reports that she does not wish to have more children, but is hesitant to use contraception, other than condoms, due to multiple miscarriages after using other contraceptive measures.  Discussed regular blood pressure monitoring.  U/S scheduled for 09/12/2019- patient reminded of this appointment.  Patient is experiencing anxiety over the delivery of the baby given the length of time it has been since her first pregnancy- reassurance and education provided.   2. History of preterm delivery History of preterm induced delivery due to pre-eclampsia.  Discussed the importance of monitoring BP daily and continuing to take daily ASA.  Will keep close evaluation of blood pressure throughout pregnancy.   3. History of pregnancy induced hypertension BP good today.  Taking daily ASA.  Discussed the importance of monitoring BP daily and putting this information into [employer].  Discussed symptoms of headache, seeing spots or floaters, and/or RUQ pain that would required BP evaluation at home.  Emphasized evaluation in MAU if SBP >140 or DBP >90 and does not improve with rest and hydration.   4. Obesity in pregnancy Weight gain of 32 lb as of today.  Discussed the importance of small, frequent meals with high protein and low carbohydrates to help decrease hunger and reduce the risk of reflux symptoms.   5. Rh negative state in antepartum period RhoGAM provided at last visit.   Preterm labor symptoms and general obstetric precautions including but not limited to vaginal bleeding, contractions, leaking of fluid and fetal movement were reviewed in detail with the patient. Please refer to After Visit  Summary for other counseling recommendations.  Return in about 2 weeks (around 09/13/2019) for LOB, In-Person, any provider.   Tollie Eth, NP  Attestation of Supervision:  I confirm that I  have verified the information documented in the nurse practitioner's note and that I have also personally reperformed the history, physical exam and all medical decision making activities.  I have verified that all services and findings are accurately documented in this note; and I agree with management and plan as outlined in the documentation. I have also made any necessary editorial changes.   Vonzella Nipple, PA-C [facility] [employer], [facility] 08/30/2019 12:27 PM

## 2019-08-30 NOTE — Patient Instructions (Addendum)
You have an ultrasound scheduled for 09/12/2019. Please keep this appointment in mind.    Fetal Movement Counts Patient Name: ________________________________________________ Patient Due Date: ____________________ What is a fetal movement count?  A fetal movement count is the number of times that you feel your baby move during a certain amount of time. This may also be called a fetal kick count. A fetal movement count is recommended for every pregnant woman. You may be asked to start counting fetal movements as Julia Potter as week 28 of your pregnancy. Pay attention to when your baby is most active. You may notice your baby's sleep and wake cycles. You may also notice things that make your baby move more. You should do a fetal movement count:  When your baby is normally most active.  At the same time each day. A good time to count movements is while you are resting, after having something to eat and drink. How do I count fetal movements? 1. Find a quiet, comfortable area. Sit, or lie down on your side. 2. Write down the date, the start time and stop time, and the number of movements that you felt between those two times. Take this information with you to your health care visits. 3. Write down your start time when you feel the first movement. 4. Count kicks, flutters, swishes, rolls, and jabs. You should feel at least 10 movements. 5. You may stop counting after you have felt 10 movements, or if you have been counting for 2 hours. Write down the stop time. 6. If you do not feel 10 movements in 2 hours, contact your health care provider for further instructions. Your health care provider may want to do additional tests to assess your baby's well-being. Contact a health care provider if:  You feel fewer than 10 movements in 2 hours.  Your baby is not moving like he or she usually does. Date: ____________ Start time: ____________ Stop time: ____________ Movements: ____________ Date: ____________  Start time: ____________ Stop time: ____________ Movements: ____________ Date: ____________ Start time: ____________ Stop time: ____________ Movements: ____________ Date: ____________ Start time: ____________ Stop time: ____________ Movements: ____________ Date: ____________ Start time: ____________ Stop time: ____________ Movements: ____________ Date: ____________ Start time: ____________ Stop time: ____________ Movements: ____________ Date: ____________ Start time: ____________ Stop time: ____________ Movements: ____________ Date: ____________ Start time: ____________ Stop time: ____________ Movements: ____________ Date: ____________ Start time: ____________ Stop time: ____________ Movements: ____________ This information is not intended to replace advice given to you by your health care provider. Make sure you discuss any questions you have with your health care provider. Document Revised: 09/06/2018 Document Reviewed: 09/06/2018 Elsevier Patient Education  2020 [employer]  [employer] of the uterus can occur throughout pregnancy, but they are not always a sign that you are in labor. You may have practice contractions called Braxton Hicks contractions. These false labor contractions are sometimes confused with true labor. What are Julia Potter contractions? Braxton Hicks contractions are tightening movements that occur in the muscles of the uterus before labor. Unlike true labor contractions, these contractions do not result in opening (dilation) and thinning of the cervix. Toward the end of pregnancy (32-34 weeks), Braxton Hicks contractions can happen more often and may become stronger. These contractions are sometimes difficult to tell apart from true labor because they can be very uncomfortable. You should not feel embarrassed if you go to the hospital with false labor. Sometimes, the only way to tell if you are in true labor  is for your health care provider to  look for changes in the cervix. The health care provider will do a physical exam and may monitor your contractions. If you are not in true labor, the exam should show that your cervix is not dilating and your water has not broken. If there are no other health problems associated with your pregnancy, it is completely safe for you to be sent home with false labor. You may continue to have Braxton Hicks contractions until you go into true labor. How to tell the difference between true labor and false labor True labor  Contractions last 30-70 seconds.  Contractions become very regular.  Discomfort is usually felt in the top of the uterus, and it spreads to the lower abdomen and low back.  Contractions do not go away with walking.  Contractions usually become more intense and increase in frequency.  The cervix dilates and gets thinner. False labor  Contractions are usually shorter and not as strong as true labor contractions.  Contractions are usually irregular.  Contractions are often felt in the front of the lower abdomen and in the groin.  Contractions may go away when you walk around or change positions while lying down.  Contractions get weaker and are shorter-lasting as time goes on.  The cervix usually does not dilate or become thin. Follow these instructions at home:   Take over-the-counter and prescription medicines only as told by your health care provider.  Keep up with your usual exercises and follow other instructions from your health care provider.  Eat and drink lightly if you think you are going into labor.  If Braxton Hicks contractions are making you uncomfortable: ? Change your position from lying down or resting to walking, or change from walking to resting. ? Sit and rest in a tub of warm water. ? Drink enough fluid to keep your urine pale yellow. Dehydration may cause these contractions. ? Do slow and deep breathing several times an hour.  Keep all  follow-up prenatal visits as told by your health care provider. This is important. Contact a health care provider if:  You have a fever.  You have continuous pain in your abdomen. Get help right away if:  Your contractions become stronger, more regular, and closer together.  You have fluid leaking or gushing from your vagina.  You pass blood-tinged mucus (bloody show).  You have bleeding from your vagina.  You have low back pain that you never had before.  You feel your baby's head pushing down and causing pelvic pressure.  Your baby is not moving inside you as much as it used to. Summary  Contractions that occur before labor are called Braxton Hicks contractions, false labor, or practice contractions.  Braxton Hicks contractions are usually shorter, weaker, farther apart, and less regular than true labor contractions. True labor contractions usually become progressively stronger and regular, and they become more frequent.  Manage discomfort from [facility] contractions by changing position, resting in a warm bath, drinking plenty of water, or practicing deep breathing. This information is not intended to replace advice given to you by your health care provider. Make sure you discuss any questions you have with your health care provider. Document Revised: 12/30/2016 Document Reviewed: 06/02/2016 Elsevier Patient Education  2020 [employer].

## 2019-09-12 ENCOUNTER — Ambulatory Visit: Payer: Medicaid Other | Admitting: *Deleted

## 2019-09-12 ENCOUNTER — Ambulatory Visit: Payer: Medicaid Other | Attending: Obstetrics and Gynecology

## 2019-09-12 ENCOUNTER — Other Ambulatory Visit: Payer: Self-pay

## 2019-09-12 DIAGNOSIS — Z8759 Personal history of other complications of pregnancy, childbirth and the puerperium: Secondary | ICD-10-CM | POA: Diagnosis present

## 2019-09-12 DIAGNOSIS — O99213 Obesity complicating pregnancy, third trimester: Secondary | ICD-10-CM | POA: Insufficient documentation

## 2019-09-12 DIAGNOSIS — Z6791 Unspecified blood type, Rh negative: Secondary | ICD-10-CM

## 2019-09-12 DIAGNOSIS — Z363 Encounter for antenatal screening for malformations: Secondary | ICD-10-CM

## 2019-09-12 DIAGNOSIS — Z3A18 18 weeks gestation of pregnancy: Secondary | ICD-10-CM

## 2019-09-12 DIAGNOSIS — O358XX0 Maternal care for other (suspected) fetal abnormality and damage, not applicable or unspecified: Secondary | ICD-10-CM

## 2019-09-12 DIAGNOSIS — O99210 Obesity complicating pregnancy, unspecified trimester: Secondary | ICD-10-CM | POA: Diagnosis present

## 2019-09-12 DIAGNOSIS — N96 Recurrent pregnancy loss: Secondary | ICD-10-CM | POA: Insufficient documentation

## 2019-09-12 DIAGNOSIS — O99212 Obesity complicating pregnancy, second trimester: Secondary | ICD-10-CM

## 2019-09-12 DIAGNOSIS — E669 Obesity, unspecified: Secondary | ICD-10-CM

## 2019-09-12 DIAGNOSIS — O0990 Supervision of high risk pregnancy, unspecified, unspecified trimester: Secondary | ICD-10-CM | POA: Insufficient documentation

## 2019-09-12 DIAGNOSIS — B009 Herpesviral infection, unspecified: Secondary | ICD-10-CM | POA: Diagnosis present

## 2019-09-12 DIAGNOSIS — O26899 Other specified pregnancy related conditions, unspecified trimester: Secondary | ICD-10-CM | POA: Insufficient documentation

## 2019-09-12 DIAGNOSIS — Z362 Encounter for other antenatal screening follow-up: Secondary | ICD-10-CM | POA: Diagnosis not present

## 2019-09-12 IMAGING — US US OB TRANSVAGINAL
1 series · 15 of 25 positions shown · non-contrast
Comparison: Prior ultrasound from 12/07/2017

CLINICAL DATA: Initial evaluation for vaginal bleeding in early
pregnancy.

EXAM:
OBSTETRIC <14 WK US AND TRANSVAGINAL OB US
TECHNIQUE: Both transabdominal and transvaginal ultrasound examinations were
performed for complete evaluation of the gestation as well as the
maternal uterus, adnexal regions, and pelvic cul-de-sac.
Transvaginal technique was performed to assess early pregnancy.

[Series 1: us ob transvaginal · 25 acquisitions, 15 frames shown]
[im 1/25]
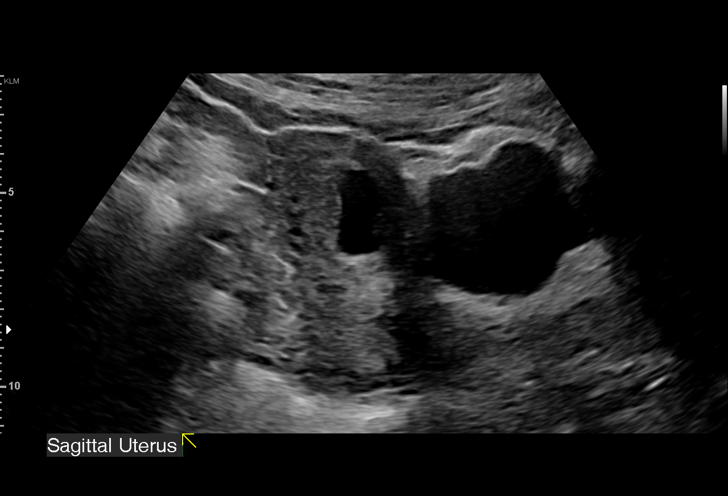
[im 3/25]
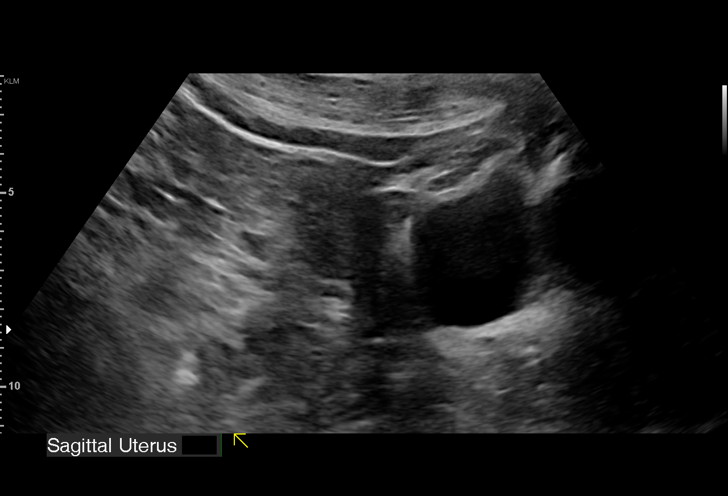
[im 5/25]
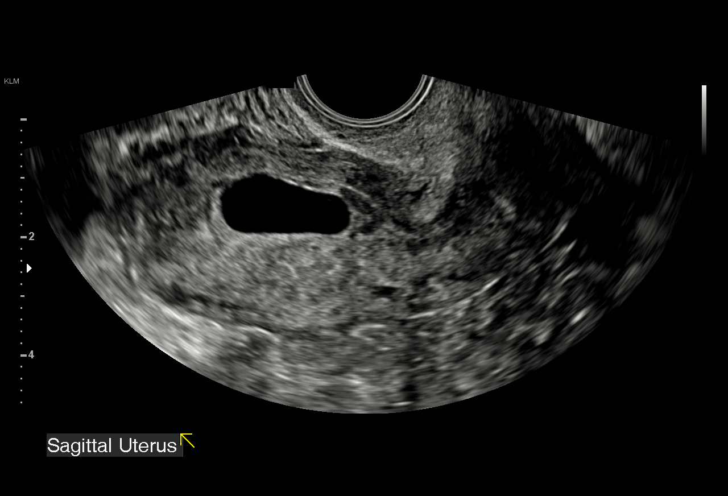
[im 6/25]
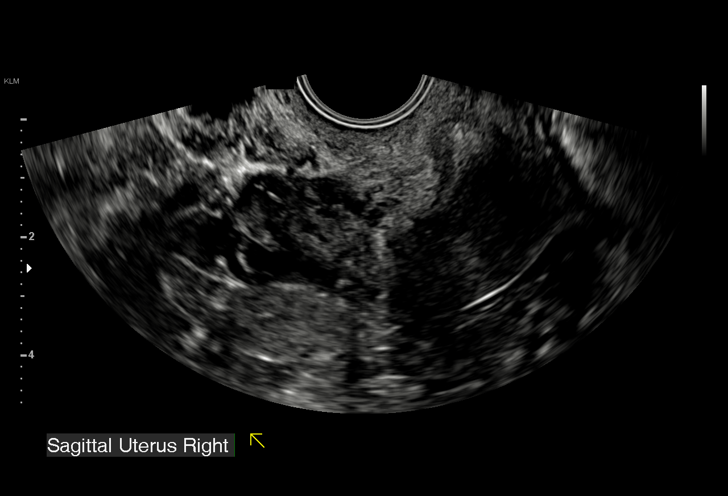
[im 8/25]
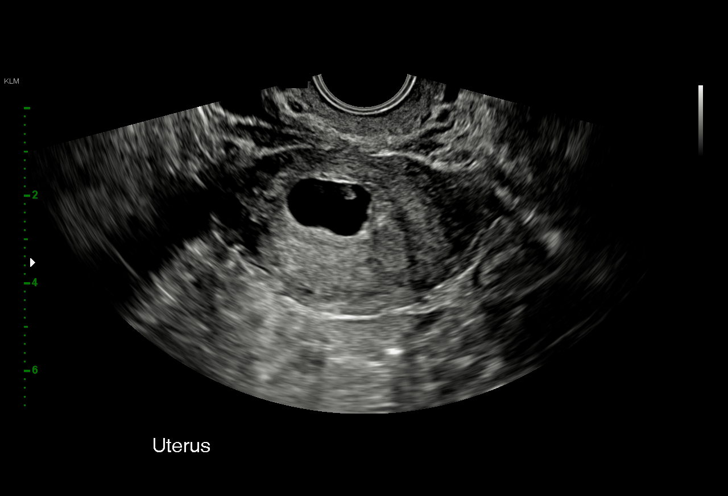
[im 10/25]
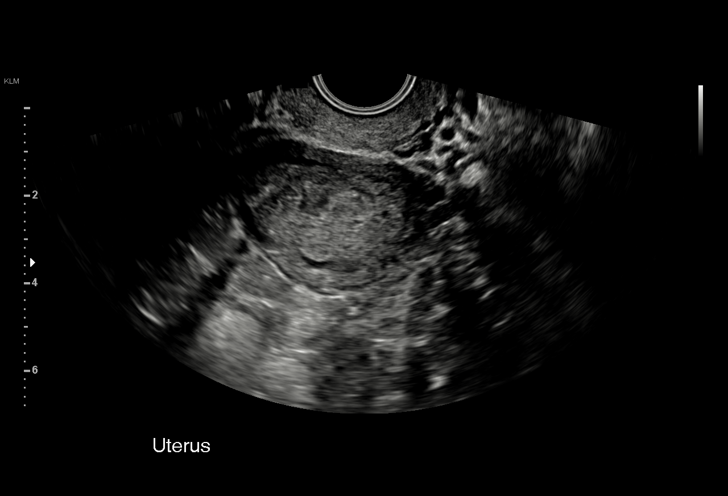
[im 11/25]
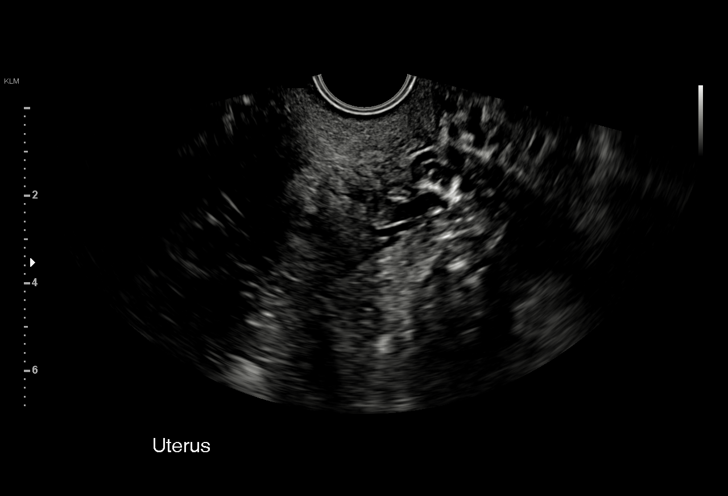
[im 13/25]
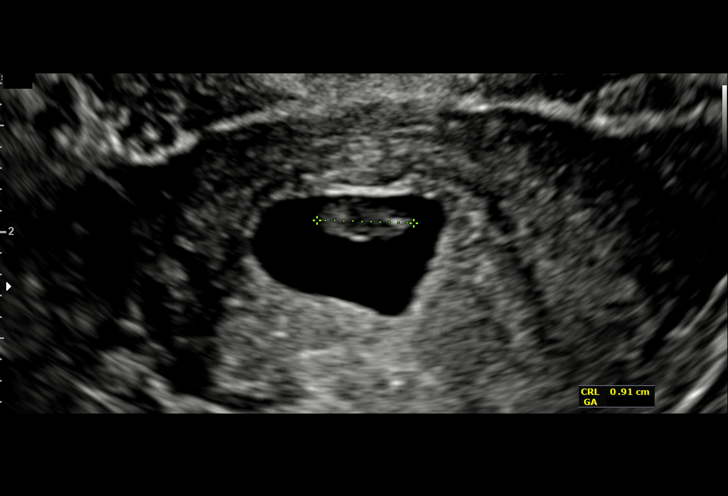
[im 15/25]
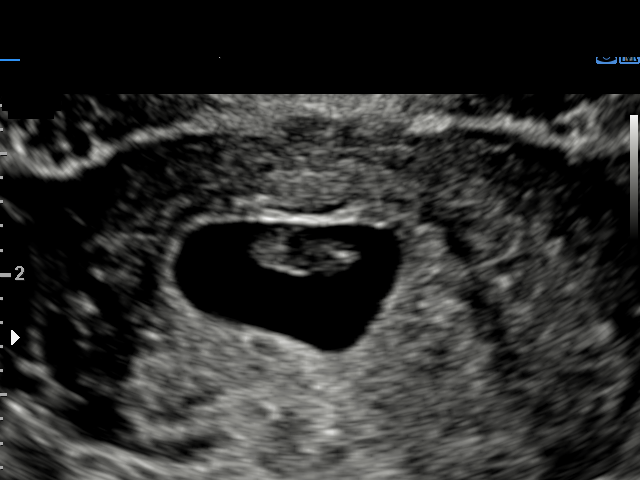
[im 16/25]
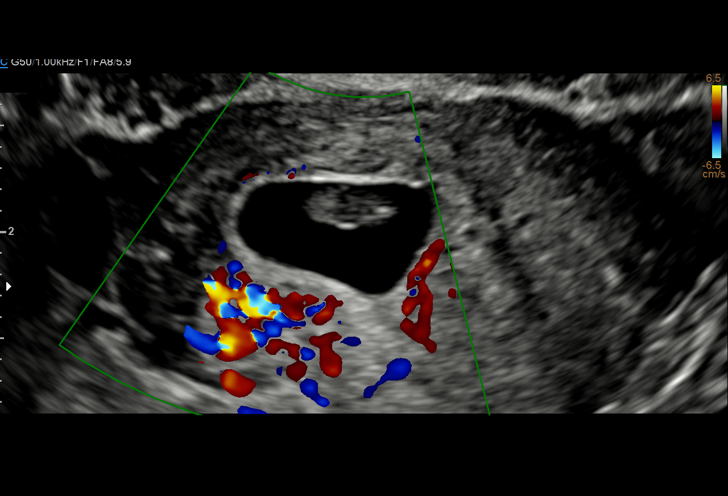
[im 18/25]
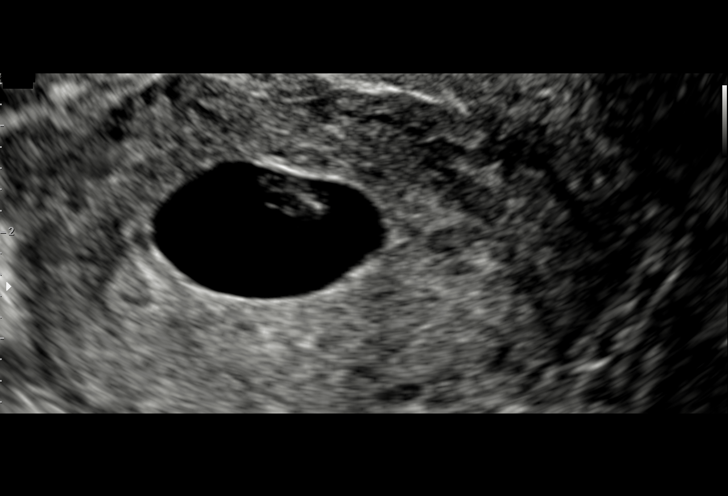
[im 20/25]
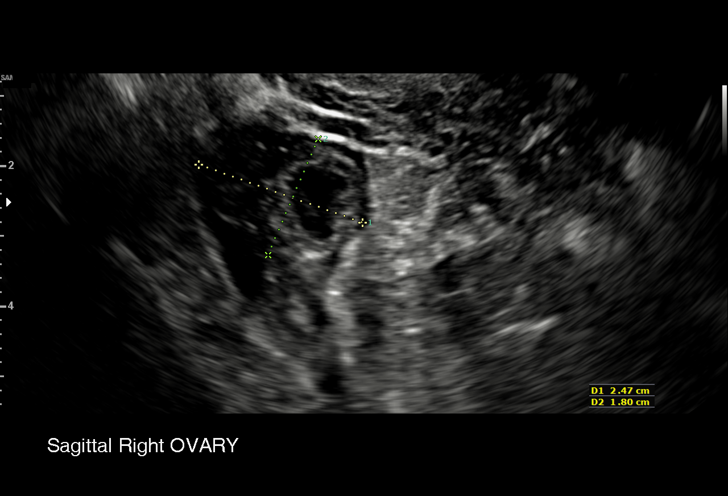
[im 21/25]
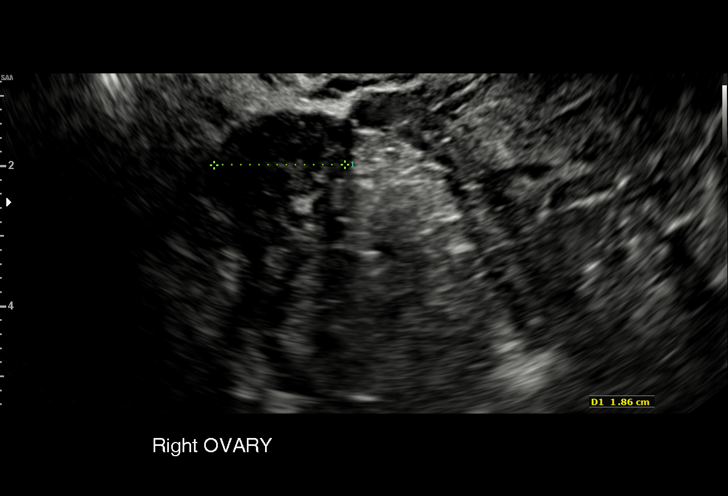
[im 23/25]
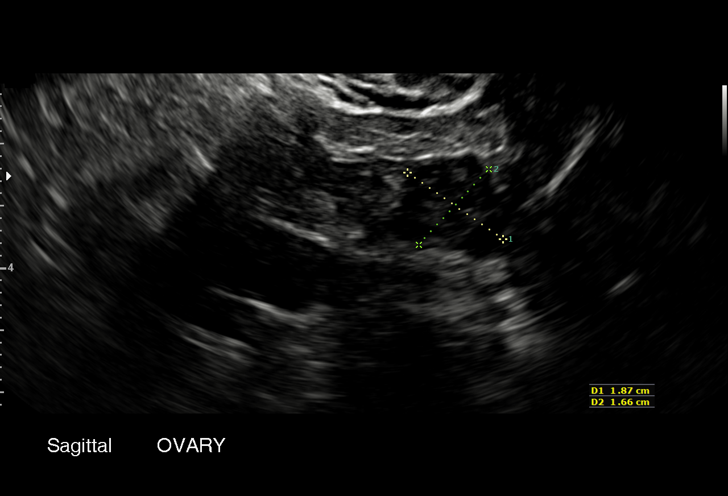
[im 25/25]
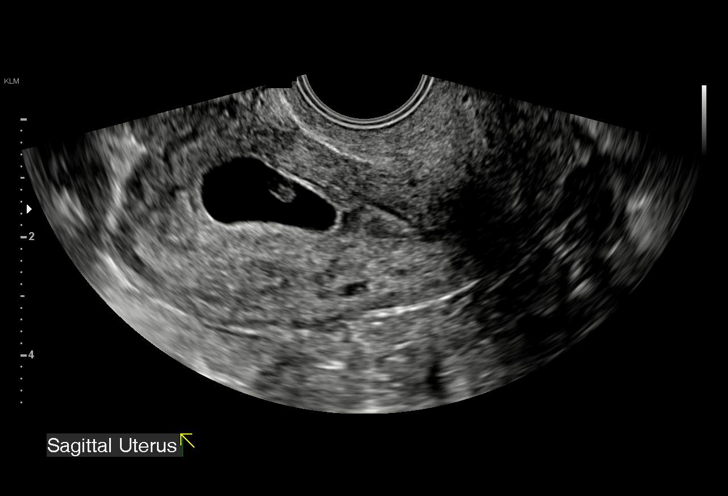

[15 of 25 positions shown; findings below may reference images not displayed]

FINDINGS: Intrauterine gestational sac: Single

Yolk sac:  Not visualized.

Embryo:  Present.

Cardiac Activity: None detectable.

Heart Rate: N/A  bpm

CRL: 9.3 mm   6 w   6 d                  US EDC: 08/23/2018

Subchorionic hemorrhage:  None visualized.

Maternal uterus/adnexae: Ovaries normal in appearance bilaterally.
Small corpus luteal cyst noted on the right.
IMPRESSION: 1. Single intrauterine pregnancy with crown-rump length measuring
9.3 mm, with no detectable cardiac activity. Findings meet
definitive criteria for failed pregnancy. This follows SRU consensus
guidelines: Diagnostic Criteria for Nonviable Pregnancy Early in the
First Trimester. N Engl J Med 0764;[DATE].
2. No other acute maternal uterine or adnexal abnormality
identified.

## 2019-09-17 ENCOUNTER — Ambulatory Visit (INDEPENDENT_AMBULATORY_CARE_PROVIDER_SITE_OTHER): Payer: Medicaid Other | Admitting: Obstetrics and Gynecology

## 2019-09-17 ENCOUNTER — Encounter: Payer: Self-pay | Admitting: Obstetrics and Gynecology

## 2019-09-17 ENCOUNTER — Other Ambulatory Visit: Payer: Self-pay

## 2019-09-17 VITALS — BP 143/89 | HR 105 | Wt 238.0 lb

## 2019-09-17 DIAGNOSIS — Z6841 Body Mass Index (BMI) 40.0 and over, adult: Secondary | ICD-10-CM

## 2019-09-17 DIAGNOSIS — B009 Herpesviral infection, unspecified: Secondary | ICD-10-CM

## 2019-09-17 DIAGNOSIS — Z8751 Personal history of pre-term labor: Secondary | ICD-10-CM

## 2019-09-17 DIAGNOSIS — Z8759 Personal history of other complications of pregnancy, childbirth and the puerperium: Secondary | ICD-10-CM | POA: Diagnosis not present

## 2019-09-17 DIAGNOSIS — O99210 Obesity complicating pregnancy, unspecified trimester: Secondary | ICD-10-CM

## 2019-09-17 DIAGNOSIS — O0990 Supervision of high risk pregnancy, unspecified, unspecified trimester: Secondary | ICD-10-CM

## 2019-09-17 DIAGNOSIS — N96 Recurrent pregnancy loss: Secondary | ICD-10-CM

## 2019-09-17 DIAGNOSIS — F99 Mental disorder, not otherwise specified: Secondary | ICD-10-CM

## 2019-09-17 DIAGNOSIS — O163 Unspecified maternal hypertension, third trimester: Secondary | ICD-10-CM | POA: Insufficient documentation

## 2019-09-17 DIAGNOSIS — O26899 Other specified pregnancy related conditions, unspecified trimester: Secondary | ICD-10-CM

## 2019-09-17 DIAGNOSIS — Z6791 Unspecified blood type, Rh negative: Secondary | ICD-10-CM

## 2019-09-17 DIAGNOSIS — Z3A32 32 weeks gestation of pregnancy: Secondary | ICD-10-CM

## 2019-09-17 DIAGNOSIS — O2603 Excessive weight gain in pregnancy, third trimester: Secondary | ICD-10-CM | POA: Insufficient documentation

## 2019-09-17 DIAGNOSIS — R7989 Other specified abnormal findings of blood chemistry: Secondary | ICD-10-CM

## 2019-09-17 MED ORDER — VALACYCLOVIR HCL 1 G PO TABS: 1000.0000 mg | ORAL_TABLET | Freq: Every day | ORAL | 1 refills | Status: DC

## 2019-09-17 NOTE — Progress Notes (Signed)
Since last week patient reported colorless, odorless fluid in underwear. This has been occurring continuously for a week now. Patient states that it is too much to be sweat. She reports increased pressure that started at the same time as the increased fluid a week ago. Patient also reports pain above her belly button that has become more frequent within the past week as well. After taking the patients blood pressure, it was elevated. After asking the patient how she feels, she says her brain feels "foggy" and that shes been tired with a decreased appetite during the past week.

## 2019-09-17 NOTE — Patient Instructions (Signed)
Third Trimester of Pregnancy The third trimester is from week 28 through week 40 (months 7 through 9). The third trimester is a time when the unborn baby (fetus) is growing rapidly. At the end of the ninth month, the fetus is about 20 inches in length and weighs 6-10 pounds. Body changes during your third trimester Your body will continue to go through many changes during pregnancy. The changes vary from woman to woman. During the third trimester:  Your weight will continue to increase. You can expect to gain 25-35 pounds (11-16 kg) by the end of the pregnancy.  You may begin to get stretch marks on your hips, abdomen, and breasts.  You may urinate more often because the fetus is moving lower into your pelvis and pressing on your bladder.  You may develop or continue to have heartburn. This is caused by increased hormones that slow down muscles in the digestive tract.  You may develop or continue to have constipation because increased hormones slow digestion and cause the muscles that push waste through your intestines to relax.  You may develop hemorrhoids. These are swollen veins (varicose veins) in the rectum that can itch or be painful.  You may develop swollen, bulging veins (varicose veins) in your legs.  You may have increased body aches in the pelvis, back, or thighs. This is due to weight gain and increased hormones that are relaxing your joints.  You may have changes in your hair. These can include thickening of your hair, rapid growth, and changes in texture. Some women also have hair loss during or after pregnancy, or hair that feels dry or thin. Your hair will most likely return to normal after your baby is born.  Your breasts will continue to grow and they will continue to become tender. A yellow fluid (colostrum) may leak from your breasts. This is the first milk you are producing for your baby.  Your belly button may stick out.  You may notice more swelling in your hands,  face, or ankles.  You may have increased tingling or numbness in your hands, arms, and legs. The skin on your belly may also feel numb.  You may feel short of breath because of your expanding uterus.  You may have more problems sleeping. This can be caused by the size of your belly, increased need to urinate, and an increase in your body's metabolism.  You may notice the fetus "dropping," or moving lower in your abdomen (lightening).  You may have increased vaginal discharge.  You may notice your joints feel loose and you may have pain around your pelvic bone. What to expect at prenatal visits You will have prenatal exams every 2 weeks until week 36. Then you will have weekly prenatal exams. During a routine prenatal visit:  You will be weighed to make sure you and the baby are growing normally.  Your blood pressure will be taken.  Your abdomen will be measured to track your baby's growth.  The fetal heartbeat will be listened to.  Any test results from the previous visit will be discussed.  You may have a cervical check near your due date to see if your cervix has softened or thinned (effaced).  You will be tested for Group B streptococcus. This happens between 35 and 37 weeks. Your health care provider may ask you:  What your birth plan is.  How you are feeling.  If you are feeling the baby move.  If you have had any abnormal   symptoms, such as leaking fluid, bleeding, severe headaches, or abdominal cramping.  If you are using any tobacco products, including cigarettes, chewing tobacco, and electronic cigarettes.  If you have any questions. Other tests or screenings that may be performed during your third trimester include:  Blood tests that check for low iron levels (anemia).  Fetal testing to check the health, activity level, and growth of the fetus. Testing is done if you have certain medical conditions or if there are problems during the pregnancy.  Nonstress test  (NST). This test checks the health of your baby to make sure there are no signs of problems, such as the baby not getting enough oxygen. During this test, a belt is placed around your belly. The baby is made to move, and its heart rate is monitored during movement. What is false labor? False labor is a condition in which you feel small, irregular tightenings of the muscles in the womb (contractions) that usually go away with rest, changing position, or drinking water. These are called Braxton Hicks contractions. Contractions may last for hours, days, or even weeks before true labor sets in. If contractions come at regular intervals, become more frequent, increase in intensity, or become painful, you should see your health care provider. What are the signs of labor?  Abdominal cramps.  Regular contractions that start at 10 minutes apart and become stronger and more frequent with time.  Contractions that start on the top of the uterus and spread down to the lower abdomen and back.  Increased pelvic pressure and dull back pain.  A watery or bloody mucus discharge that comes from the vagina.  Leaking of amniotic fluid. This is also known as your "water breaking." It could be a slow trickle or a gush. Let your health care provider know if it has a color or strange odor. If you have any of these signs, call your health care provider right away, even if it is before your due date. Follow these instructions at home: Medicines  Follow your health care provider's instructions regarding medicine use. Specific medicines may be either safe or unsafe to take during pregnancy.  Take a prenatal vitamin that contains at least 600 micrograms (mcg) of folic acid.  If you develop constipation, try taking a stool softener if your health care provider approves. Eating and drinking   Eat a balanced diet that includes fresh fruits and vegetables, whole grains, good sources of protein such as meat, eggs, or tofu,  and low-fat dairy. Your health care provider will help you determine the amount of weight gain that is right for you.  Avoid raw meat and uncooked cheese. These carry germs that can cause birth defects in the baby.  If you have low calcium intake from food, talk to your health care provider about whether you should take a daily calcium supplement.  Eat four or five small meals rather than three large meals a day.  Limit foods that are high in fat and processed sugars, such as fried and sweet foods.  To prevent constipation: ? Drink enough fluid to keep your urine clear or pale yellow. ? Eat foods that are high in fiber, such as fresh fruits and vegetables, whole grains, and beans. Activity  Exercise only as directed by your health care provider. Most women can continue their usual exercise routine during pregnancy. Try to exercise for 30 minutes at least 5 days a week. Stop exercising if you experience uterine contractions.  Avoid heavy lifting.  Do   not exercise in extreme heat or humidity, or at high altitudes.  Wear low-heel, comfortable shoes.  Practice good posture.  You may continue to have sex unless your health care provider tells you otherwise. Relieving pain and discomfort  Take frequent breaks and rest with your legs elevated if you have leg cramps or low back pain.  Take warm sitz baths to soothe any pain or discomfort caused by hemorrhoids. Use hemorrhoid cream if your health care provider approves.  Wear a good support bra to prevent discomfort from breast tenderness.  If you develop varicose veins: ? Wear support pantyhose or compression stockings as told by your healthcare provider. ? Elevate your feet for 15 minutes, 3-4 times a day. Prenatal care  Write down your questions. Take them to your prenatal visits.  Keep all your prenatal visits as told by your health care provider. This is important. Safety  Wear your seat belt at all times when driving.  Make  a list of emergency phone numbers, including numbers for family, friends, the hospital, and police and fire departments. General instructions  Avoid cat litter boxes and soil used by cats. These carry germs that can cause birth defects in the baby. If you have a cat, ask someone to clean the litter box for you.  Do not travel far distances unless it is absolutely necessary and only with the approval of your health care provider.  Do not use hot tubs, steam rooms, or saunas.  Do not drink alcohol.  Do not use any products that contain nicotine or tobacco, such as cigarettes and e-cigarettes. If you need help quitting, ask your health care provider.  Do not use any medicinal herbs or unprescribed drugs. These chemicals affect the formation and growth of the baby.  Do not douche or use tampons or scented sanitary pads.  Do not cross your legs for long periods of time.  To prepare for the arrival of your baby: ? Take prenatal classes to understand, practice, and ask questions about labor and delivery. ? Make a trial run to the hospital. ? Visit the hospital and tour the maternity area. ? Arrange for maternity or paternity leave through employers. ? Arrange for family and friends to take care of pets while you are in the hospital. ? Purchase a rear-facing car seat and make sure you know how to install it in your car. ? Pack your hospital bag. ? Prepare the baby's nursery. Make sure to remove all pillows and stuffed animals from the baby's crib to prevent suffocation.  Visit your dentist if you have not gone during your pregnancy. Use a soft toothbrush to brush your teeth and be gentle when you floss. Contact a health care provider if:  You are unsure if you are in labor or if your water has broken.  You become dizzy.  You have mild pelvic cramps, pelvic pressure, or nagging pain in your abdominal area.  You have lower back pain.  You have persistent nausea, vomiting, or  diarrhea.  You have an unusual or bad smelling vaginal discharge.  You have pain when you urinate. Get help right away if:  Your water breaks before 37 weeks.  You have regular contractions less than 5 minutes apart before 37 weeks.  You have a fever.  You are leaking fluid from your vagina.  You have spotting or bleeding from your vagina.  You have severe abdominal pain or cramping.  You have rapid weight loss or weight gain.  You have   shortness of breath with chest pain.  You notice sudden or extreme swelling of your face, hands, ankles, feet, or legs.  Your baby makes fewer than 10 movements in 2 hours.  You have severe headaches that do not go away when you take medicine.  You have vision changes. Summary  The third trimester is from week 28 through week 40, months 7 through 9. The third trimester is a time when the unborn baby (fetus) is growing rapidly.  During the third trimester, your discomfort may increase as you and your baby continue to gain weight. You may have abdominal, leg, and back pain, sleeping problems, and an increased need to urinate.  During the third trimester your breasts will keep growing and they will continue to become tender. A yellow fluid (colostrum) may leak from your breasts. This is the first milk you are producing for your baby.  False labor is a condition in which you feel small, irregular tightenings of the muscles in the womb (contractions) that eventually go away. These are called Braxton Hicks contractions. Contractions may last for hours, days, or even weeks before true labor sets in.  Signs of labor can include: abdominal cramps; regular contractions that start at 10 minutes apart and become stronger and more frequent with time; watery or bloody mucus discharge that comes from the vagina; increased pelvic pressure and dull back pain; and leaking of amniotic fluid. This information is not intended to replace advice given to you by your  health care provider. Make sure you discuss any questions you have with your health care provider. Document Revised: 05/10/2018 Document Reviewed: 02/23/2016 Elsevier Patient Education  2020 [employer]  

## 2019-09-17 NOTE — Progress Notes (Signed)
PRENATAL VISIT NOTE  Subjective:  Julia Potter is a 29 y.o. G4W1027 at [redacted]w[redacted]d being seen today for ongoing prenatal care.  She is currently monitored for the following issues for this low-risk pregnancy and has Psychiatric illness; Hirsutism; BMI 40.0-44.9, adult (HCC); Supervision of high risk pregnancy, antepartum; History of pregnancy induced hypertension; Obesity; Obesity in pregnancy; Herpes; History of multiple miscarriages; Rh negative state in antepartum period; History of preterm delivery; History of PCOS; Abnormal TSH; and [redacted] weeks gestation of pregnancy on their problem list.  Patient reports concern of "leakage of fluid".  Contractions: Irritability. Vag. Bleeding: None.  Movement: Present. Reports ongoing vaginal drainage since last week. Endorses "spots of fluid on the panty liner". Having a girl.   Initial blood pressure 153/93 and 143/89 on repeat today. Pt denies headache but does endorses "intermittent squigglies" in vision today. No chest pain, dyspnea, or RUQ pain. No recent vaginal lesions given history of herpes. Pt taking intermittent valtrex if concern of symptoms but no recent doses. Good adherence to ASA and prenatal vitamin. Occasional contractions. Reports normal blood pressure readings at home.  The following portions of the patient's history were reviewed and updated as appropriate: allergies, current medications, past family history, past medical history, past social history, past surgical history and problem list.   Objective:   Vitals:   09/17/19 1428  BP: (!) 143/89  Pulse: (!) 105  Weight: 238 lb (108 kg)    Fetal Status: Fetal Heart Rate (bpm): 143   Movement: Present     General:  Alert, oriented and cooperative. Patient is in no acute distress.  Skin: Skin is warm and dry. No rash noted.   Cardiovascular: Normal heart rate noted  Respiratory: Normal respiratory effort, no problems with respiration noted  Abdomen: Soft, gravid, appropriate for  gestational age.  Pain/Pressure: Present     Pelvic: Cervical exam performed in the presence of a chaperone. Cervix visually closed on speculum exam. Normal external genitalia and normal vaginal tissue. Moderate amount of physiologic whitish discharge without pooling.  Extremities: Normal range of motion.  Edema: None  Mental Status: Normal mood and affect. Normal behavior. Normal judgment and thought content.   Assessment and Plan:  Pregnancy: G5P0131 at [redacted]w[redacted]d 1. Supervision of high risk pregnancy, antepartum, [redacted] weeks gestation: F/u growth [place] at [redacted]w[redacted]d with normal AFI and growth. Pt endorses +fetal movement. +FHTs and appropriate fundal height. Given pt's concern of "leakage of fluid", spec exam was performed and notable for visually closed cervix without evidence of pooling". - provided reassurance and discussed findings of physiologic discharge and closed cervix on speculum exam - s/p Rhogam and Tdap on 08/16/19 - continue daily prenatal vitamin and daily ASA - plan for f/u prenatal appt in 2 weeks or sooner as clinically indicated if confirmed diagnosis of gestational hypertension vs preeclampsia as noted below  2. History of pregnancy induced hypertension  Elevated BP affecting third trimester: Pt with all prior normal blood pressure measurements at home and in clinic except for 1 prior elevated blood pressure to 144/86 at [redacted]w[redacted]d. Pt reports normal blood pressures at home but today in clinic blood pressure initially elevated to 153/93 and 143/89 on repeat. Pt reports "intermittent squiggles" in vision but otherwise no symptoms concerning for preeclampsia. - f/u UP:C, CMP, CBC obtained in clinic today - instructed continued daily ASA and daily BP checks at home - strict return precautions for severe HA, constant vision changes, chest pain, dyspnea, RUQ pain, and persistently elevated blood pressures -  pending repeat blood pressures and HELLP labs obtained today, pt will likely require subsequent  antenatal testing for new diagnosis of gHTN vs Preeclampsia  3. History of preterm delivery: Occurred in the setting of preeclampsia. - strict return precautions for signs/symptoms of preeclampsia today as noted below  4. Obesity in pregnancy  Excessive Weight Gain in Pregnancy: Pre-pregnancy BMI 36. Current weight up 43 lb since initial prenatal appt at [redacted]w[redacted]d. - counseled on importance of continued regular physical activity and healthy meal options  5. Abnormal TSH: Last TSH 0.913 in third trimester on 7/16. No signs/symptoms of hypo/hyperthyroid today.  - plan to recheck in postpartum period or sooner if concerning symptoms  6. Rh negative state in antepartum period: S/p rhogam on 08/16/19. No report of bleeding or recent abdominal trauma.  7. History of Herpes: No recent GU symptoms or need for prophylactic valtrex.  - discussed importance of starting valtrex 1g daily at 36 weeks - script valtrex script to pt's preferred pharmacy today  8. Psychiatric illness: Pt denies mood and safety concerns today. Edinburgh Score 0 today. No current medications. -continue to monitor  Preterm labor symptoms and general obstetric precautions including but not limited to vaginal bleeding, contractions, leaking of fluid and fetal movement were reviewed in detail with the patient. Please refer to After Visit Summary for other counseling recommendations.   Return in about 2 weeks (around 10/01/2019) for in person f/u OB appt, any provider.  Future Appointments  Date Time Provider Department Center  10/01/2019  9:15 AM Armando Reichert, CNM [facility]  10/15/2019  9:15 AM Mcneil Sober [facility]  10/22/2019  9:15 AM Alric Seton, MD [facility]  10/29/2019  9:15 AM Marylene Land, CNM [facility]    Sheila Oats, MD  OB Fellow, [facility]

## 2019-09-18 LAB — PROTEIN / CREATININE RATIO, URINE
Creatinine, Urine: 165.8 mg/dL
Protein, Ur: 52 mg/dL
Protein/Creat Ratio: 314 mg/g creat — ABNORMAL HIGH (ref 0–200)

## 2019-09-18 LAB — COMPREHENSIVE METABOLIC PANEL
ALT: 15 IU/L (ref 0–32)
AST: 17 IU/L (ref 0–40)
Albumin/Globulin Ratio: 1.3 (ref 1.2–2.2)
Albumin: 3.5 g/dL — ABNORMAL LOW (ref 3.9–5.0)
Alkaline Phosphatase: 128 IU/L — ABNORMAL HIGH (ref 48–121)
BUN/Creatinine Ratio: 9 (ref 9–23)
BUN: 4 mg/dL — ABNORMAL LOW (ref 6–20)
Bilirubin Total: 0.2 mg/dL (ref 0.0–1.2)
CO2: 20 mmol/L (ref 20–29)
Calcium: 8.9 mg/dL (ref 8.7–10.2)
Chloride: 103 mmol/L (ref 96–106)
Creatinine, Ser: 0.45 mg/dL — ABNORMAL LOW (ref 0.57–1.00)
GFR calc Af Amer: 159 mL/min/{1.73_m2} (ref >59)
GFR calc non Af Amer: 138 mL/min/{1.73_m2} (ref >59)
Globulin, Total: 2.8 g/dL (ref 1.5–4.5)
Glucose: 84 mg/dL (ref 65–99)
Potassium: 3.4 mmol/L — ABNORMAL LOW (ref 3.5–5.2)
Sodium: 137 mmol/L (ref 134–144)
Total Protein: 6.3 g/dL (ref 6.0–8.5)

## 2019-09-18 LAB — CBC
Hematocrit: 34.1 % (ref 34.0–46.6)
Hemoglobin: 11.9 g/dL (ref 11.1–15.9)
MCH: 30.8 pg (ref 26.6–33.0)
MCHC: 34.9 g/dL (ref 31.5–35.7)
MCV: 88 fL (ref 79–97)
Platelets: 346 10*3/uL (ref 150–450)
RBC: 3.86 x10E6/uL (ref 3.77–5.28)
RDW: 13.4 % (ref 11.7–15.4)
WBC: 8.9 10*3/uL (ref 3.4–10.8)

## 2019-09-20 ENCOUNTER — Telehealth: Payer: Self-pay | Admitting: Obstetrics and Gynecology

## 2019-09-20 NOTE — Telephone Encounter (Signed)
Called pt to inform her of elevated protein in urine, which is concerning for preeclampsia without severe features. Pt confirmed no elevated blood pressures at home and no concerning signs/symptoms concerning for severe preeclampsia. Provided strict return precautions to present to [facility] if concern of severe HA, persistent vision changes, chest pain, difficulty breathing, RUQ pain, or blood pressures >150/100. Pt has next prenatal appt on 8/31 but will call sooner if any concerns.

## 2019-09-29 ENCOUNTER — Inpatient Hospital Stay (HOSPITAL_COMMUNITY)
Admission: AD | Admit: 2019-09-29 | Discharge: 2019-09-29 | Disposition: A | Discharge status: Home / Self Care | Payer: Medicaid Other | Attending: Obstetrics & Gynecology | Admitting: Obstetrics & Gynecology

## 2019-09-29 ENCOUNTER — Other Ambulatory Visit: Payer: Self-pay

## 2019-09-29 ENCOUNTER — Encounter (HOSPITAL_COMMUNITY): Payer: Self-pay | Admitting: Obstetrics & Gynecology

## 2019-09-29 DIAGNOSIS — O0990 Supervision of high risk pregnancy, unspecified, unspecified trimester: Secondary | ICD-10-CM

## 2019-09-29 DIAGNOSIS — O2623 Pregnancy care for patient with recurrent pregnancy loss, third trimester: Secondary | ICD-10-CM | POA: Diagnosis not present

## 2019-09-29 DIAGNOSIS — E669 Obesity, unspecified: Secondary | ICD-10-CM | POA: Diagnosis not present

## 2019-09-29 DIAGNOSIS — Z3A34 34 weeks gestation of pregnancy: Secondary | ICD-10-CM

## 2019-09-29 DIAGNOSIS — Z8249 Family history of ischemic heart disease and other diseases of the circulatory system: Secondary | ICD-10-CM | POA: Diagnosis not present

## 2019-09-29 DIAGNOSIS — R030 Elevated blood-pressure reading, without diagnosis of hypertension: Secondary | ICD-10-CM | POA: Diagnosis present

## 2019-09-29 DIAGNOSIS — O360930 Maternal care for other rhesus isoimmunization, third trimester, not applicable or unspecified: Secondary | ICD-10-CM | POA: Diagnosis not present

## 2019-09-29 DIAGNOSIS — B009 Herpesviral infection, unspecified: Secondary | ICD-10-CM | POA: Diagnosis not present

## 2019-09-29 DIAGNOSIS — Z6791 Unspecified blood type, Rh negative: Secondary | ICD-10-CM

## 2019-09-29 DIAGNOSIS — O98513 Other viral diseases complicating pregnancy, third trimester: Secondary | ICD-10-CM | POA: Diagnosis not present

## 2019-09-29 DIAGNOSIS — O10919 Unspecified pre-existing hypertension complicating pregnancy, unspecified trimester: Secondary | ICD-10-CM

## 2019-09-29 DIAGNOSIS — O26899 Other specified pregnancy related conditions, unspecified trimester: Secondary | ICD-10-CM

## 2019-09-29 DIAGNOSIS — O09293 Supervision of pregnancy with other poor reproductive or obstetric history, third trimester: Secondary | ICD-10-CM | POA: Insufficient documentation

## 2019-09-29 DIAGNOSIS — O99210 Obesity complicating pregnancy, unspecified trimester: Secondary | ICD-10-CM | POA: Insufficient documentation

## 2019-09-29 DIAGNOSIS — O119 Pre-existing hypertension with pre-eclampsia, unspecified trimester: Secondary | ICD-10-CM

## 2019-09-29 DIAGNOSIS — Z8759 Personal history of other complications of pregnancy, childbirth and the puerperium: Secondary | ICD-10-CM

## 2019-09-29 DIAGNOSIS — Z7982 Long term (current) use of aspirin: Secondary | ICD-10-CM | POA: Diagnosis not present

## 2019-09-29 DIAGNOSIS — O99213 Obesity complicating pregnancy, third trimester: Secondary | ICD-10-CM

## 2019-09-29 DIAGNOSIS — O10913 Unspecified pre-existing hypertension complicating pregnancy, third trimester: Secondary | ICD-10-CM | POA: Diagnosis not present

## 2019-09-29 DIAGNOSIS — O113 Pre-existing hypertension with pre-eclampsia, third trimester: Secondary | ICD-10-CM | POA: Insufficient documentation

## 2019-09-29 DIAGNOSIS — N96 Recurrent pregnancy loss: Secondary | ICD-10-CM

## 2019-09-29 HISTORY — DX: Unspecified pre-existing hypertension complicating pregnancy, unspecified trimester: O10.919

## 2019-09-29 LAB — COMPREHENSIVE METABOLIC PANEL
ALT: 16 U/L (ref 0–44)
AST: 17 U/L (ref 15–41)
Albumin: 2.6 g/dL — ABNORMAL LOW (ref 3.5–5.0)
Alkaline Phosphatase: 131 U/L — ABNORMAL HIGH (ref 38–126)
Anion gap: 8 (ref 5–15)
BUN: <5 mg/dL — ABNORMAL LOW (ref 6–20)
CO2: 22 mmol/L (ref 22–32)
Calcium: 9 mg/dL (ref 8.9–10.3)
Chloride: 106 mmol/L (ref 98–111)
Creatinine, Ser: 0.42 mg/dL — ABNORMAL LOW (ref 0.44–1.00)
GFR calc Af Amer: >60 mL/min (ref >60)
GFR calc non Af Amer: >60 mL/min (ref >60)
Glucose, Bld: 78 mg/dL (ref 70–99)
Potassium: 3.1 mmol/L — ABNORMAL LOW (ref 3.5–5.1)
Sodium: 136 mmol/L (ref 135–145)
Total Bilirubin: 0.6 mg/dL (ref 0.3–1.2)
Total Protein: 6.2 g/dL — ABNORMAL LOW (ref 6.5–8.1)

## 2019-09-29 LAB — CBC
HCT: 35.4 % — ABNORMAL LOW (ref 36.0–46.0)
Hemoglobin: 11.9 g/dL — ABNORMAL LOW (ref 12.0–15.0)
MCH: 30.4 pg (ref 26.0–34.0)
MCHC: 33.6 g/dL (ref 30.0–36.0)
MCV: 90.5 fL (ref 80.0–100.0)
Platelets: 320 10*3/uL (ref 150–400)
RBC: 3.91 MIL/uL (ref 3.87–5.11)
RDW: 13.8 % (ref 11.5–15.5)
WBC: 6.2 10*3/uL (ref 4.0–10.5)
nRBC: 0 % (ref 0.0–0.2)

## 2019-09-29 LAB — URINALYSIS, ROUTINE W REFLEX MICROSCOPIC
Bilirubin Urine: NEGATIVE
Glucose, UA: NEGATIVE mg/dL
Hgb urine dipstick: NEGATIVE
Ketones, ur: NEGATIVE mg/dL
Leukocytes,Ua: NEGATIVE
Nitrite: NEGATIVE
Protein, ur: NEGATIVE mg/dL
Specific Gravity, Urine: 1.01 (ref 1.005–1.030)
pH: 8 (ref 5.0–8.0)

## 2019-09-29 LAB — PROTEIN / CREATININE RATIO, URINE
Creatinine, Urine: 52.33 mg/dL
Protein Creatinine Ratio: 0.55 mg/mg{Cre} — ABNORMAL HIGH (ref 0.00–0.15)
Total Protein, Urine: 29 mg/dL

## 2019-09-29 MED ORDER — BETAMETHASONE SOD PHOS & ACET 6 (3-3) MG/ML IJ SUSP
12.0000 mg | Freq: Once | INTRAMUSCULAR | Status: AC
  Administered 2019-09-29: 12 mg via INTRAMUSCULAR
  Filled 2019-09-29: qty 5

## 2019-09-29 MED ORDER — BETAMETHASONE SOD PHOS & ACET 6 (3-3) MG/ML IJ SUSP
12.0000 mg | Freq: Once | INTRAMUSCULAR | Status: AC
  Administered 2019-10-01: 12 mg via INTRAMUSCULAR

## 2019-09-29 NOTE — [facility] Note (Signed)
Julia Potter is a 29 y.o. at [redacted]w[redacted]d here in [facility] reporting: was told she has pre eclampsia and was told to come in if it's high. Today it was 161/100. Denies headache and RUQ pain. Is having some vision changes.  Onset of complaint: ongoing  Pain score: 0/10  Vitals:   09/29/19 1550  BP: (!) 148/99  Pulse: (!) 105  Resp: 16  Temp: 98.4 F (36.9 C)  SpO2: 99%     FHT: +FM, 136  Lab orders placed from triage: [facility]

## 2019-09-29 NOTE — MAU Provider Note (Addendum)
Patient Julia Potter is a 29 y.o. [id number]  at [redacted]w[redacted]d here after having elevated BPs at home.  She was diagnosed with pre-e without severe features due to elevated protein creatine in her urine on 08/17. She says that she has had elevated blood pressure outside of pregnancy but it resolved when she lost a lot of weight. She has been having intermittment floating spots that stay on for 10 seconds at a time and then goes away. This has been going on for at least 6 weeks.    She has a history of pre-eclampsia with severe features that required delivery at 36 weeks (NSVD).  She has been on and off again blood pressure medicine; has been on labetelol and procardia. She took herself off her BP medicines in the past by losing weight and eating healthy.    History     CSN: [id number]  Arrival date and time: 09/29/19 1537   First Provider Initiated Contact with Patient 09/29/19 1609      Chief Complaint  Patient presents with   vision changes   Hypertension   Hypertension This is a new problem. Episode onset: off and on throughout her life time. Pertinent negatives include no anxiety, blurred vision, chest pain or shortness of breath. Treatments tried: she has a history of pre-e.    She reports that her BP was as high as 161/100; other values today were 158's/60s (which she reports as high for her). Her normal BP is 130/140 over 60s.  OB History    Gravida  5   Para  1   Term  0   Preterm  1   AB  3   Living  1     SAB  3   TAB  0   Ectopic  0   Multiple  0   Live Births  1        Obstetric Comments  G1P0101 Admitted to [facility] for PIH, IOL. Delivered a viable female at 36 weeks        Past Medical History:  Diagnosis Date   Abnormal uterine bleeding (AUB) 01/20/2016   ALLERGIC RHINITIS    Anxiety    no meds   Chlamydia    Depression    doing ok, in therapy - no meds   Herpes    HSV infection    Pregnancy induced hypertension 2010   resolved after  pregnancy   Rh negative state in antepartum period 05/10/2017   [ ] Rhophylac   Seasonal allergies     Past Surgical History:  Procedure Laterality Date   DILATION AND EVACUATION N/A 05/23/2017   Procedure: DILATATION AND EVACUATION;  Surgeon: 05/25/2017, MD;  Location: [facility];  Service: Gynecology;  Laterality: N/A;  needs Opp [employer]   INTRAUTERINE DEVICE INSERTION  09/2010   IUD REMOVAL      Family History  Problem Relation Age of Onset   Hypertension Mother    Diabetes Maternal Grandmother    Asthma Daughter     Social History   Tobacco Use   Smoking status: Never Smoker   Smokeless tobacco: Never Used  Vaping Use   Vaping Use: Never used  Substance Use Topics   Alcohol use: Not Currently    Comment: socially   Drug use: No    Allergies: No Known Allergies  Medications Prior to Admission  Medication Sig Dispense Refill Last Dose   aspirin EC 81 MG tablet Take 1 tablet (81 mg total) by mouth daily. Take after  12 weeks for prevention of preeclampsia later in pregnancy 300 tablet 2    diphenhydrAMINE (BENADRYL) 25 MG tablet Take 25 mg by mouth daily as needed for allergies.      famotidine (PEPCID) 20 MG tablet Take 1 tablet (20 mg total) by mouth 2 (two) times daily. 60 tablet 3    Prenatal Vit-Fe Fumarate-FA (MULTIVITAMIN-PRENATAL) 27-0.8 MG TABS tablet Take 1 tablet by mouth daily at 12 noon.      promethazine (PHENERGAN) 25 MG tablet Take 1 tablet (25 mg total) by mouth every 6 (six) hours as needed for nausea or vomiting. May take one half of tablet instead of whole tablet if desired. 30 tablet 0    valACYclovir (VALTREX) 1000 MG tablet Take 1 tablet (1,000 mg total) by mouth daily. Start at [redacted] weeks gestation and continue until time of delivery. 30 tablet 1     Review of Systems  Constitutional: Negative.   HENT: Negative.   Eyes: Negative for blurred vision.  Respiratory: Negative.  Negative for shortness of breath.   Cardiovascular: Negative.   Negative for chest pain.  Gastrointestinal: Negative.  Negative for abdominal pain.  Genitourinary: Negative for vaginal bleeding and vaginal discharge.  Neurological: Negative.        Reports floating spots on and off-again but none right now.   Hematological: Negative.   Psychiatric/Behavioral: Negative.    Physical Exam   Blood pressure 135/86, pulse 97, temperature 98.4 F (36.9 C), temperature source Oral, resp. rate 16, height 5\' 2"  (1.575 m), weight 107.6 kg, last menstrual period 01/31/2019, SpO2 99 %.  Physical Exam Constitutional:      Appearance: Normal appearance.  HENT:     Head: Normocephalic.  Pulmonary:     Effort: Pulmonary effort is normal.  Abdominal:     General: Abdomen is flat.  Musculoskeletal:        General: Normal range of motion.  Skin:    General: Skin is warm.  Neurological:     Mental Status: She is alert.     MAU Course  Procedures  MDM -NST: 135 mod var, present acel, no decels, uterine irratability --CBC is normal  -CMP normal -PCR now 0.55, increase from 0.32 on 09/17/2019.  -Patient received dose of BMZ while in MAU on 09/29/2019; she continue to be asymptomatic. Review of patients charts show elevated BPs outside of pregnancy and in first trimester, patient most likely CHTN.  -Patient had MFM 10/01/2019 on 09/12/2019; normal growth.  -Reviewd patient's vitals, symtoms and presentation with Dr. 11/12/2019, who agrees that patient stable for discharge with BMZ today and at appt on Tuesday morning.  Patient Vitals for the past 24 hrs:  BP Temp Temp src Pulse Resp SpO2 Height Weight  09/29/19 1801 135/86 -- -- 97 -- -- -- --  09/29/19 1746 136/86 -- -- 99 -- -- -- --  09/29/19 1731 136/85 -- -- 99 -- -- -- --  09/29/19 1716 (!) 138/92 -- -- (!) 102 -- -- -- --  09/29/19 1701 137/88 -- -- 98 -- -- -- --  09/29/19 1646 (!) 143/89 -- -- 98 -- -- -- --  09/29/19 1631 (!) 143/87 -- -- 100 -- -- -- --  09/29/19 1616 (!) 143/88 -- -- 97 -- -- -- --   09/29/19 1601 (!) 150/93 -- -- (!) 104 -- -- -- --  09/29/19 1550 (!) 148/99 98.4 F (36.9 C) Oral (!) 105 16 99 % -- --  09/29/19 1545 -- -- -- -- -- --  5\' 2"  (1.575 m) 107.6 kg     Assessment and Plan   1. Chronic hypertension with superimposed preeclampsia   2. Supervision of high risk pregnancy, antepartum   3. History of pregnancy induced hypertension   4. Obesity in pregnancy   5. Rh negative state in antepartum period   6. Herpes   7. History of multiple miscarriages   --start taking Valtrex now (patient had RX at home) -message sent to clinic to add BPP or NST/AFI x 2 for patient this week due to new diagnosis on 8/29 of super imposed pre-e without severe features. Message also sent to provider to request she receive 2nd dose of BMZ on Tuesday morning at her appointment.  -patient denies HA, blurry vision, floating spots, decreased fetal movements, all over body swelling, RUQ pain.  -Reviewed warning signs and when to return to MAU. All questions answered.    Sunday Dainel Arcidiacono 09/29/2019, 7:31 PM

## 2019-09-29 NOTE — Discharge Instructions (Signed)
Preeclampsia and Eclampsia Preeclampsia is a serious condition that may develop during pregnancy. This condition causes high blood pressure and increased protein in your urine along with other symptoms, such as headaches and vision changes. These symptoms may develop as the condition gets worse. Preeclampsia may occur at 20 weeks of pregnancy or later. Diagnosing and treating preeclampsia early is very important. If not treated early, it can cause serious problems for you and your baby. One problem it can lead to is eclampsia. Eclampsia is a condition that causes muscle jerking or shaking (convulsions or seizures) and other serious problems for the mother. During pregnancy, delivering your baby may be the best treatment for preeclampsia or eclampsia. For most women, preeclampsia and eclampsia symptoms go away after giving birth. In rare cases, a woman may develop preeclampsia after giving birth (postpartum preeclampsia). This usually occurs within 48 hours after childbirth but may occur up to 6 weeks after giving birth. What are the causes? The cause of preeclampsia is not known. What increases the risk? The following risk factors make you more likely to develop preeclampsia:  Being pregnant for the first time.  Having had preeclampsia during a past pregnancy.  Having a family history of preeclampsia.  Having high blood pressure.  Being pregnant with more than one baby.  Being 35 or older.  Being African-American.  Having kidney disease or diabetes.  Having medical conditions such as lupus or blood diseases.  Being very overweight (obese). What are the signs or symptoms? The most common symptoms are:  Severe headaches.  Vision problems, such as blurred or double vision.  Abdominal pain, especially upper abdominal pain. Other symptoms that may develop as the condition gets worse include:  Sudden weight gain.  Sudden swelling of the hands, face, legs, and feet.  Severe nausea  and vomiting.  Numbness in the face, arms, legs, and feet.  Dizziness.  Urinating less than usual.  Slurred speech.  Convulsions or seizures. How is this diagnosed? There are no screening tests for preeclampsia. Your health care provider will ask you about symptoms and check for signs of preeclampsia during your prenatal visits. You may also have tests that include:  Checking your blood pressure.  Urine tests to check for protein. Your health care provider will check for this at every prenatal visit.  Blood tests.  Monitoring your baby's heart rate.  Ultrasound. How is this treated? You and your health care provider will determine the treatment approach that is best for you. Treatment may include:  Having more frequent prenatal exams to check for signs of preeclampsia, if you have an increased risk for preeclampsia.  Medicine to lower your blood pressure.  Staying in the hospital, if your condition is severe. There, treatment will focus on controlling your blood pressure and the amount of fluids in your body (fluid retention).  Taking medicine (magnesium sulfate) to prevent seizures. This may be given as an injection or through an IV.  Taking a low-dose aspirin during your pregnancy.  Delivering your baby early. You may have your labor started with medicine (induced), or you may have a cesarean delivery. Follow these instructions at home: Eating and drinking   Drink enough fluid to keep your urine pale yellow.  Avoid caffeine. Lifestyle  Do not use any products that contain nicotine or tobacco, such as cigarettes and e-cigarettes. If you need help quitting, ask your health care provider.  Do not use alcohol or drugs.  Avoid stress as much as possible. Rest and get   plenty of sleep. General instructions  Take over-the-counter and prescription medicines only as told by your health care provider.  When lying down, lie on your left side. This keeps pressure off your  major blood vessels.  When sitting or lying down, raise (elevate) your feet. Try putting some pillows underneath your lower legs.  Exercise regularly. Ask your health care provider what kinds of exercise are best for you.  Keep all follow-up and prenatal visits as told by your health care provider. This is important. How is this prevented? There is no known way of preventing preeclampsia or eclampsia from developing. However, to lower your risk of complications and detect problems early:  Get regular prenatal care. Your health care provider may be able to diagnose and treat the condition early.  Maintain a healthy weight. Ask your health care provider for help managing weight gain during pregnancy.  Work with your health care provider to manage any long-term (chronic) health conditions you have, such as diabetes or kidney problems.  You may have tests of your blood pressure and kidney function after giving birth.  Your health care provider may have you take low-dose aspirin during your next pregnancy. Contact a health care provider if:  You have symptoms that your health care provider told you may require more treatment or monitoring, such as: ? Headaches. ? Nausea or vomiting. ? Abdominal pain. ? Dizziness. ? Light-headedness. Get help right away if:  You have severe: ? Abdominal pain. ? Headaches that do not get better. ? Dizziness. ? Vision problems. ? Confusion. ? Nausea or vomiting.  You have any of the following: ? A seizure. ? Sudden, rapid weight gain. ? Sudden swelling in your hands, ankles, or face. ? Trouble moving any part of your body. ? Numbness in any part of your body. ? Trouble speaking. ? Abnormal bleeding.  You faint. Summary  Preeclampsia is a serious condition that may develop during pregnancy.  This condition causes high blood pressure and increased protein in your urine along with other symptoms, such as headaches and vision  changes.  Diagnosing and treating preeclampsia early is very important. If not treated early, it can cause serious problems for you and your baby.  Get help right away if you have symptoms that your health care provider told you to watch for. This information is not intended to replace advice given to you by your health care provider. Make sure you discuss any questions you have with your health care provider. Document Revised: 09/19/2017 Document Reviewed: 08/24/2015 [employer] Patient Education  2020 [employer]  

## 2019-09-30 ENCOUNTER — Other Ambulatory Visit: Payer: Self-pay | Admitting: General Practice

## 2019-09-30 DIAGNOSIS — O0990 Supervision of high risk pregnancy, unspecified, unspecified trimester: Secondary | ICD-10-CM

## 2019-10-01 ENCOUNTER — Ambulatory Visit: Payer: Medicaid Other | Admitting: *Deleted

## 2019-10-01 ENCOUNTER — Encounter: Payer: Self-pay | Admitting: *Deleted

## 2019-10-01 ENCOUNTER — Ambulatory Visit (INDEPENDENT_AMBULATORY_CARE_PROVIDER_SITE_OTHER): Payer: Medicaid Other | Admitting: Advanced Practice Midwife

## 2019-10-01 ENCOUNTER — Other Ambulatory Visit: Payer: Self-pay | Admitting: *Deleted

## 2019-10-01 ENCOUNTER — Other Ambulatory Visit: Payer: Self-pay

## 2019-10-01 ENCOUNTER — Encounter: Payer: Self-pay | Admitting: Advanced Practice Midwife

## 2019-10-01 ENCOUNTER — Ambulatory Visit: Payer: Medicaid Other | Attending: Student

## 2019-10-01 VITALS — BP 142/91 | HR 107 | Wt 235.7 lb

## 2019-10-01 DIAGNOSIS — O10013 Pre-existing essential hypertension complicating pregnancy, third trimester: Secondary | ICD-10-CM | POA: Diagnosis not present

## 2019-10-01 DIAGNOSIS — O0990 Supervision of high risk pregnancy, unspecified, unspecified trimester: Secondary | ICD-10-CM

## 2019-10-01 DIAGNOSIS — O09219 Supervision of pregnancy with history of pre-term labor, unspecified trimester: Secondary | ICD-10-CM

## 2019-10-01 DIAGNOSIS — O09293 Supervision of pregnancy with other poor reproductive or obstetric history, third trimester: Secondary | ICD-10-CM

## 2019-10-01 DIAGNOSIS — O09213 Supervision of pregnancy with history of pre-term labor, third trimester: Secondary | ICD-10-CM | POA: Diagnosis not present

## 2019-10-01 DIAGNOSIS — B009 Herpesviral infection, unspecified: Secondary | ICD-10-CM

## 2019-10-01 DIAGNOSIS — Z6838 Body mass index (BMI) 38.0-38.9, adult: Secondary | ICD-10-CM

## 2019-10-01 DIAGNOSIS — O360130 Maternal care for anti-D [Rh] antibodies, third trimester, not applicable or unspecified: Secondary | ICD-10-CM | POA: Diagnosis not present

## 2019-10-01 DIAGNOSIS — Z6791 Unspecified blood type, Rh negative: Secondary | ICD-10-CM | POA: Insufficient documentation

## 2019-10-01 DIAGNOSIS — N96 Recurrent pregnancy loss: Secondary | ICD-10-CM | POA: Diagnosis present

## 2019-10-01 DIAGNOSIS — Z8759 Personal history of other complications of pregnancy, childbirth and the puerperium: Secondary | ICD-10-CM

## 2019-10-01 DIAGNOSIS — O119 Pre-existing hypertension with pre-eclampsia, unspecified trimester: Secondary | ICD-10-CM

## 2019-10-01 DIAGNOSIS — O99213 Obesity complicating pregnancy, third trimester: Secondary | ICD-10-CM | POA: Diagnosis not present

## 2019-10-01 DIAGNOSIS — E669 Obesity, unspecified: Secondary | ICD-10-CM | POA: Diagnosis not present

## 2019-10-01 DIAGNOSIS — O26899 Other specified pregnancy related conditions, unspecified trimester: Secondary | ICD-10-CM | POA: Diagnosis present

## 2019-10-01 DIAGNOSIS — O99210 Obesity complicating pregnancy, unspecified trimester: Secondary | ICD-10-CM | POA: Diagnosis present

## 2019-10-01 DIAGNOSIS — Z3A34 34 weeks gestation of pregnancy: Secondary | ICD-10-CM

## 2019-10-01 DIAGNOSIS — O1493 Unspecified pre-eclampsia, third trimester: Secondary | ICD-10-CM

## 2019-10-01 MED ORDER — BETAMETHASONE SOD PHOS & ACET 6 (3-3) MG/ML IJ SUSP
12.0000 mg | Freq: Once | INTRAMUSCULAR | Status: DC
Start: 2019-10-01 — End: 2019-10-01

## 2019-10-01 NOTE — Patient Instructions (Addendum)
Preeclampsia and Eclampsia Preeclampsia is a serious condition that may develop during pregnancy. This condition causes high blood pressure and increased protein in your urine along with other symptoms, such as headaches and vision changes. These symptoms may develop as the condition gets worse. Preeclampsia may occur at 20 weeks of pregnancy or later. Diagnosing and treating preeclampsia early is very important. If not treated early, it can cause serious problems for you and your baby. One problem it can lead to is eclampsia. Eclampsia is a condition that causes muscle jerking or shaking (convulsions or seizures) and other serious problems for the mother. During pregnancy, delivering your baby may be the best treatment for preeclampsia or eclampsia. For most women, preeclampsia and eclampsia symptoms go away after giving birth. In rare cases, a woman may develop preeclampsia after giving birth (postpartum preeclampsia). This usually occurs within 48 hours after childbirth but may occur up to 6 weeks after giving birth. What are the causes? The cause of preeclampsia is not known. What increases the risk? The following risk factors make you more likely to develop preeclampsia:  Being pregnant for the first time.  Having had preeclampsia during a past pregnancy.  Having a family history of preeclampsia.  Having high blood pressure.  Being pregnant with more than one baby.  Being 35 or older.  Being African-American.  Having kidney disease or diabetes.  Having medical conditions such as lupus or blood diseases.  Being very overweight (obese). What are the signs or symptoms? The most common symptoms are:  Severe headaches.  Vision problems, such as blurred or double vision.  Abdominal pain, especially upper abdominal pain. Other symptoms that may develop as the condition gets worse include:  Sudden weight gain.  Sudden swelling of the hands, face, legs, and feet.  Severe nausea  and vomiting.  Numbness in the face, arms, legs, and feet.  Dizziness.  Urinating less than usual.  Slurred speech.  Convulsions or seizures. How is this diagnosed? There are no screening tests for preeclampsia. Your health care provider will ask you about symptoms and check for signs of preeclampsia during your prenatal visits. You may also have tests that include:  Checking your blood pressure.  Urine tests to check for protein. Your health care provider will check for this at every prenatal visit.  Blood tests.  Monitoring your baby's heart rate.  Ultrasound. How is this treated? You and your health care provider will determine the treatment approach that is best for you. Treatment may include:  Having more frequent prenatal exams to check for signs of preeclampsia, if you have an increased risk for preeclampsia.  Medicine to lower your blood pressure.  Staying in the hospital, if your condition is severe. There, treatment will focus on controlling your blood pressure and the amount of fluids in your body (fluid retention).  Taking medicine (magnesium sulfate) to prevent seizures. This may be given as an injection or through an IV.  Taking a low-dose aspirin during your pregnancy.  Delivering your baby early. You may have your labor started with medicine (induced), or you may have a cesarean delivery. Follow these instructions at home: Eating and drinking   Drink enough fluid to keep your urine pale yellow.  Avoid caffeine. Lifestyle  Do not use any products that contain nicotine or tobacco, such as cigarettes and e-cigarettes. If you need help quitting, ask your health care provider.  Do not use alcohol or drugs.  Avoid stress as much as possible. Rest and get   plenty of sleep. General instructions  Take over-the-counter and prescription medicines only as told by your health care provider.  When lying down, lie on your left side. This keeps pressure off your  major blood vessels.  When sitting or lying down, raise (elevate) your feet. Try putting some pillows underneath your lower legs.  Exercise regularly. Ask your health care provider what kinds of exercise are best for you.  Keep all follow-up and prenatal visits as told by your health care provider. This is important. How is this prevented? There is no known way of preventing preeclampsia or eclampsia from developing. However, to lower your risk of complications and detect problems early:  Get regular prenatal care. Your health care provider may be able to diagnose and treat the condition early.  Maintain a healthy weight. Ask your health care provider for help managing weight gain during pregnancy.  Work with your health care provider to manage any long-term (chronic) health conditions you have, such as diabetes or kidney problems.  You may have tests of your blood pressure and kidney function after giving birth.  Your health care provider may have you take low-dose aspirin during your next pregnancy. Contact a health care provider if:  You have symptoms that your health care provider told you may require more treatment or monitoring, such as: ? Headaches. ? Nausea or vomiting. ? Abdominal pain. ? Dizziness. ? Light-headedness. Get help right away if:  You have severe: ? Abdominal pain. ? Headaches that do not get better. ? Dizziness. ? Vision problems. ? Confusion. ? Nausea or vomiting.  You have any of the following: ? A seizure. ? Sudden, rapid weight gain. ? Sudden swelling in your hands, ankles, or face. ? Trouble moving any part of your body. ? Numbness in any part of your body. ? Trouble speaking. ? Abnormal bleeding.  You faint. Summary  Preeclampsia is a serious condition that may develop during pregnancy.  This condition causes high blood pressure and increased protein in your urine along with other symptoms, such as headaches and vision  changes.  Diagnosing and treating preeclampsia early is very important. If not treated early, it can cause serious problems for you and your baby.  Get help right away if you have symptoms that your health care provider told you to watch for. This information is not intended to replace advice given to you by your health care provider. Make sure you discuss any questions you have with your health care provider. Document Revised: 09/19/2017 Document Reviewed: 08/24/2015 Elsevier Patient Education  2020 [employer].   COVID-19 Vaccination if [place] Are Pregnant or Breastfeeding  The Society for Maternal-Fetal Medicine (SMFM) and other pregnancy experts recommend that pregnant and lactating people be vaccinated against COVID-19. The Centers for Disease Control and Prevention (CDC) also recommend vaccination for "all people aged 62 years and older, including people who are pregnant, breastfeeding, trying to get pregnant now, or might become pregnant in the future." Vaccination is the best way to reduce the risks of COVID-19 infection and COVID-related complications for both you and your baby.  Three vaccines are available to prevent COVID-19: . The two-dose Pfizer vaccine for people 12 years and older--APPROVED by the [place] Food and Drug Administration on September 23, 2019 . The two-dose Moderna vaccine for people 18 years and older--AUTHORIZED for emergency use . The one-dose [employer]user[employer] vaccine for people 18 years and older (you may also see this vaccine referred to as the "[employer] vaccine")--AUTHORIZED for emergency  use  For those receiving the Pfizer and Moderna vaccines, the second dose is given 21 days [employer]) and 28 days (Moderna) after the first dose. The [employer] vaccine is only one dose.  Information for Pregnant Individuals If you are pregnant or planning to become pregnant and are thinking about getting vaccinated, consider talking with your health care professional about the  vaccine.   To help with your decision, you should consider the following key points: Anyone can get the COVID vaccines free of charge regardless of immigration status or whether they have insurance. You may be asked for your social security number, but it is  NOT required to get vaccinated.  What are benefits of getting the COVID-19 vaccines during pregnancy?  . The vaccines can help protect you from getting COVID-19. With the two-dose vaccines, you must get both doses for maximum effectiveness. It's not yet known how long protection lasts.  . Another potential benefit is that getting the vaccine while pregnant may help you pass antiCOVID-19 antibodies to your baby. In numerous studies of vaccinated moms, antibodies were found in the umbilical cord blood of babies and in the mother's breastmilk.  . The CDC, along with other federal partners, are monitoring people who have been vaccinated for serious side effects. So far, more than 139,000 pregnant people have been vaccinated. No unexpected pregnancy or fetal problems have occurred. There have been no reports of any increased risk of pregnancy loss, growth problems, or birth defects.  . A safe vaccine is generally considered one in which the benefits of being vaccinated outweigh the risks. The current vaccines are not live vaccines. There is only a very small  chance that they cross the placenta, so it's unlikely that they even reach the fetus. Vaccines don't affect future fertility. The only people who should NOT get vaccinated are those who have had a severe allergic reaction to vaccines in the past or any vaccine ingredients.  . Side effects may occur in the first 3 days after getting vaccinated.1 These include mild to moderate fever, headache, and muscle aches. Side effects may be worse after the second dose of the [employer] and Moderna vaccines. Fever should be avoided during pregnancy,especially in the first trimester. Those who develop a fever  after vaccination can take acetaminophen (Tylenol). This medication is safe to use during pregnancy and does not  affect how the vaccine works.   What are the known risks of getting COVID-19 during pregnancy?  About 1 to 3 per 1,000 pregnant women with COVID-19 will develop severe disease. Compared with those who aren't pregnant, pregnant people infected by the COVID-19 virus: . Are 3 times more likely to need ICU care . Are 2 to 3 times more likely to need advanced life support and a breathing tube  . Have a small increased risk of dying due to COVID-19 They may also be at increased risk of stillbirth and preterm birth.  What is my risk of getting COVID-19?  Your risk of getting COVID-19 depends on the chance that you will come into contact with another infected person. The risk may be higher if you live in a community where there is a lot of COVID-19 infection or work in healthcare or another high-contact setting.   What is my risk for severe complications if I get COVID-19?  Data show that older pregnant women; those with preexisting health conditions, such as a body mass index higher than 35 kg/m2, diabetes, and heart disorders; and Black or  Latinx women have an especially increased risk of severe disease and death from COVID-19.   If you still have questions about the vaccines or need more information, ask your health care provider or go to the Centers for Disease Control and Prevention's COVID-19 vaccine webpage.   An Update on the [employer] Johnson Vaccine   In April 2021, the FDA and CDC called for a brief pause to use of the [employer] vaccine. They did so after reports of a severe side effect in a very small number of women younger than age 27 following vaccination. This side effect, called thrombosis with thrombocytopenia syndrome (TTS), causes blood clots (thrombosis) combined with low levels of platelets (thrombocytopenia).  TTS following the [employer]ch[employer] vaccine is  extremely rare. At the time of this update, it has occurred in only 7 people per 1 million [employer]chJohnson [employer] Johnson shots given. According to the CDC, being on hormonal birth control (the pill, patch, or ring), pregnancy, breastfeeding, or being recently pregnant does not make you more likely to develop TTS after getting the [employer] vaccine. The pause was lifted on May 24, 2019, after the FDA and CDC determined that the known benefits of the [employer] vaccine far outweigh the risks.   Health care professionals have been alerted to the possibility of this side effect in people who have received the [facility] vaccine. National organizations continue to recommend COVID-19 vaccination with any of the vaccines for pregnant women. All women younger than age 27 years, whether pregnant, breastfeeding, or not, should be aware of the very rare risk of TTS after getting the [employer]chJohnson [employer] vaccine. The Pfizer and Moderna vaccines don't have this risk. If you get the [employer]chJohnson & Johnson vaccine,  seek medical help right away if you develop any of the following symptoms within 3 weeks of getting your shot:  . Severe or persistent headaches or blurred vision . Shortness of breath . Chest pain . Leg swelling . Persistent abdominal pain . Easy bruising or tiny blood spots under the skin beyond the injection site  Experts continue to collect health and safety information from pregnant people who have been vaccinated. If you have questions about vaccination during pregnancy, visit the CDC website or talk to your health care professional. Information for Breastfeeding/Lactating Individuals The Society for Maternal-Fetal Medicine and other pregnancy experts recommend COVID-19 vaccination for people who are breastfeeding/lactating. You don't have to delay or stop  breastfeeding just because you get vaccinated.   Getting Vaccinated  You can get vaccinated at any time during pregnancy. The CDC is  committed to monitoring the vaccine's safety for all individuals. Your health professional or vaccine clinic may give you information about enrolling in the v-safe after vaccination health checker (see the box below).Even after you're fully vaccinated, it is important to follow the CDC's guidance for wearing a mask indoors in areas where there are substantial or high rates of COVID-19 infection.   What Happens When You Enroll in v-Safe?  The v-safe after vaccination health checker program lets the CDC check in with you after your vaccination. At sign-up, you can indicate that you are pregnant. Once you do that, expect the following: . Someone may call you from the v-safe program to ask initial questions and get more information. . You may be asked to enroll in the vaccine pregnancy registry, which is collecting information about any effects of the vaccine during pregnancy. This is a great way  to help scientists monitor the vaccine's safety and effectiveness.   References 1. [address], [place] Lo M, Wallace M, Curran KG, Chamberland M, et al. The Advisory Committee on [employer]' Interim Recommendation for Use of Pfizer-BioNTech  COVID-19 Vaccine -- [place], December 2020. MMWR Morbidity and Mortality Weekly Report 2020;69. 2. FDA Briefing Document. Janssen Ad26.COV2.S Vaccine for the Prevention of COVID-19. 2021. Accessed Apr 05, 2019; Available from: FemaleHumor.es 3. PFIZER-BIONTECH COVID-19 VACCINE [package insert] [place]: Pfizer and [place], [place]: [employer]ion[employer];2020. 4. FDA Briefing Document. Moderna COVID-19 Vaccine. 2020. Accessed 2020, Dec 18; Available from: DateTunes.nl  5. Bea Laura, Bordt EA, Atyeo C, Deriso E, Akinwunmi B, Young N, et al. COVID-19 vaccine response in pregnant and lactating women: a cohort study. Am J Obstet Gynecol 2021 Mar 24. 6. Panagiotakopoulos Elbert Ewings, Omer Jack, Lipkind HS, Caprice Renshaw  DS, et al. SARS-CoV-2 Infection Among Hospitalized Pregnant Women: Reasons for Admission and Pregnancy  Characteristics - [facility], March 1-Jun 30, 2018. MMWR Morb Baxter Flattery Rep 2020 Sep 23;69(38):1355-9. 7. Zambrano LD, [place], Strid P, [place], Baldo Ash [place], et al. Update: Characteristics of Symptomatic Women of Reproductive Age with Laboratory-Confirmed SARSCoV-2 Infection by Pregnancy Status - [place] States, January 22-November 03, 2018. MMWR Morb Baxter Flattery Rep 2020 Nov 6;69(44):1641-7. 8. Delahoy MJ, [place] Duff, Weston Settle PD, Blima Ledger, et al. Characteristics and Maternal and Birth Outcomes of Hospitalized Pregnant Women with Laboratory-Confirmed COVID-19 - COVID-NET, [place] States, March 1-September 22, 2018. MMWR Morb Baxter Flattery Rep 2020 Sep 25;69(38):1347-54.

## 2019-10-01 NOTE — Addendum Note (Signed)
Addended by: Mercy Moore on: 10/01/2019 11:55 AM   Modules accepted: Orders

## 2019-10-01 NOTE — Progress Notes (Signed)
   PRENATAL VISIT NOTE  Subjective:  Julia Potter is a 29 y.o. [id number] at [redacted]w[redacted]d being seen today for ongoing prenatal care.  She is currently monitored for the following issues for this high-risk pregnancy and has Psychiatric illness; Hirsutism; BMI 40.0-44.9, adult (HCC); Supervision of high risk pregnancy, antepartum; History of pregnancy induced hypertension; Obesity; Obesity in pregnancy; Herpes; History of multiple miscarriages; Rh negative state in antepartum period; History of preterm delivery; History of PCOS; Abnormal TSH; [redacted] weeks gestation of pregnancy; Elevated blood pressure affecting pregnancy in third trimester, antepartum; Excessive weight gain in pregnancy in third trimester; and Chronic hypertension in pregnancy on their problem list.  Patient reports no complaints.  Contractions: Not present. Vag. Bleeding: None.  Movement: Present. Denies leaking of fluid.   The following portions of the patient's history were reviewed and updated as appropriate: allergies, current medications, past family history, past medical history, past social history, past surgical history and problem list.   Objective:   Vitals:   10/01/19 0921  BP: (!) 142/91  Pulse: (!) 107  Weight: 235 lb 11.2 oz (106.9 kg)    Fetal Status: Fetal Heart Rate (bpm): NST   Movement: Present     General:  Alert, oriented and cooperative. Patient is in no acute distress.  Skin: Skin is warm and dry. No rash noted.   Cardiovascular: Normal heart rate noted  Respiratory: Normal respiratory effort, no problems with respiration noted  Abdomen: Soft, gravid, appropriate for gestational age.  Pain/Pressure: Absent     Pelvic: Cervical exam deferred        Extremities: Normal range of motion.  Edema: None  Mental Status: Normal mood and affect. Normal behavior. Normal judgment and thought content.    NST:  Baseline: 145 Variability: moderate Accels: 15x15 Decels: none Toco: none Reactive/Appropriate for  GA  Assessment and Plan:  Pregnancy: [id number] at [redacted]w[redacted]d 1. Supervision of high risk pregnancy, antepartum - Fetal nonstress test; Future  2. Chronic hypertension with superimposed pre-eclampsia - Fetal nonstress test; Future - BMZ #2 today - BPP with MFM and then weekly BPP/NST here - IOL scheduled for 9/16 (37 weeks) orders placed.   3. [redacted] weeks gestation of pregnancy - Covid vaccine recommended to patient today. SMFM handout provided  Preterm labor symptoms and general obstetric precautions including but not limited to vaginal bleeding, contractions, leaking of fluid and fetal movement were reviewed in detail with the patient. Please refer to After Visit Summary for other counseling recommendations.   Return in about 1 week (around 10/08/2019).  Future Appointments  Date Time Provider Department Center  10/01/2019 11:00 AM [facility] NURSE [facility]  10/01/2019 11:15 AM [facility] US2 [facility]  10/09/2019  9:15 AM [facility] NST [facility]  10/15/2019  9:15 AM [facility]  10/16/2019  9:15 AM [facility] NST [facility]  10/22/2019  9:15 AM Alric Seton, MD [facility]  10/29/2019  9:15 AM Marvetta Gibbons, Brand Males, NP [facility]    Thressa Sheller DNP, CNM  10/01/19  10:00 AM

## 2019-10-08 ENCOUNTER — Telehealth (HOSPITAL_COMMUNITY): Payer: Self-pay | Admitting: *Deleted

## 2019-10-08 ENCOUNTER — Encounter (HOSPITAL_COMMUNITY): Payer: Self-pay | Admitting: *Deleted

## 2019-10-08 NOTE — Telephone Encounter (Signed)
Preadmission screen  

## 2019-10-09 ENCOUNTER — Encounter (HOSPITAL_COMMUNITY): Payer: Self-pay | Admitting: Obstetrics & Gynecology

## 2019-10-09 ENCOUNTER — Inpatient Hospital Stay (HOSPITAL_COMMUNITY)
Admission: AD | Admit: 2019-10-09 | Discharge: 2019-10-12 | DRG: 806 | Disposition: A | Discharge status: Home / Self Care | Payer: Medicaid Other | Attending: Obstetrics and Gynecology | Admitting: Obstetrics and Gynecology

## 2019-10-09 ENCOUNTER — Other Ambulatory Visit: Payer: Self-pay

## 2019-10-09 ENCOUNTER — Ambulatory Visit: Payer: Self-pay

## 2019-10-09 ENCOUNTER — Ambulatory Visit (INDEPENDENT_AMBULATORY_CARE_PROVIDER_SITE_OTHER): Payer: Medicaid Other | Admitting: Obstetrics & Gynecology

## 2019-10-09 VITALS — BP 144/93 | HR 111 | Wt 237.0 lb

## 2019-10-09 DIAGNOSIS — O26893 Other specified pregnancy related conditions, third trimester: Secondary | ICD-10-CM | POA: Diagnosis present

## 2019-10-09 DIAGNOSIS — O119 Pre-existing hypertension with pre-eclampsia, unspecified trimester: Secondary | ICD-10-CM

## 2019-10-09 DIAGNOSIS — O0990 Supervision of high risk pregnancy, unspecified, unspecified trimester: Secondary | ICD-10-CM

## 2019-10-09 DIAGNOSIS — O10919 Unspecified pre-existing hypertension complicating pregnancy, unspecified trimester: Secondary | ICD-10-CM | POA: Diagnosis present

## 2019-10-09 DIAGNOSIS — O1002 Pre-existing essential hypertension complicating childbirth: Secondary | ICD-10-CM | POA: Diagnosis not present

## 2019-10-09 DIAGNOSIS — Z3A36 36 weeks gestation of pregnancy: Secondary | ICD-10-CM | POA: Diagnosis not present

## 2019-10-09 DIAGNOSIS — O9832 Other infections with a predominantly sexual mode of transmission complicating childbirth: Secondary | ICD-10-CM | POA: Diagnosis present

## 2019-10-09 DIAGNOSIS — D62 Acute posthemorrhagic anemia: Secondary | ICD-10-CM | POA: Diagnosis not present

## 2019-10-09 DIAGNOSIS — O99214 Obesity complicating childbirth: Secondary | ICD-10-CM | POA: Diagnosis not present

## 2019-10-09 DIAGNOSIS — Z23 Encounter for immunization: Secondary | ICD-10-CM | POA: Diagnosis not present

## 2019-10-09 DIAGNOSIS — O9081 Anemia of the puerperium: Secondary | ICD-10-CM | POA: Diagnosis not present

## 2019-10-09 DIAGNOSIS — O1414 Severe pre-eclampsia complicating childbirth: Secondary | ICD-10-CM | POA: Diagnosis not present

## 2019-10-09 DIAGNOSIS — Z6791 Unspecified blood type, Rh negative: Secondary | ICD-10-CM

## 2019-10-09 DIAGNOSIS — Z20822 Contact with and (suspected) exposure to covid-19: Secondary | ICD-10-CM | POA: Diagnosis not present

## 2019-10-09 DIAGNOSIS — A6000 Herpesviral infection of urogenital system, unspecified: Secondary | ICD-10-CM | POA: Diagnosis present

## 2019-10-09 DIAGNOSIS — O10913 Unspecified pre-existing hypertension complicating pregnancy, third trimester: Secondary | ICD-10-CM | POA: Diagnosis not present

## 2019-10-09 DIAGNOSIS — Z3A32 32 weeks gestation of pregnancy: Secondary | ICD-10-CM

## 2019-10-09 DIAGNOSIS — Z3689 Encounter for other specified antenatal screening: Secondary | ICD-10-CM

## 2019-10-09 DIAGNOSIS — Z3A35 35 weeks gestation of pregnancy: Secondary | ICD-10-CM | POA: Diagnosis not present

## 2019-10-09 DIAGNOSIS — O113 Pre-existing hypertension with pre-eclampsia, third trimester: Secondary | ICD-10-CM

## 2019-10-09 DIAGNOSIS — O163 Unspecified maternal hypertension, third trimester: Secondary | ICD-10-CM

## 2019-10-09 DIAGNOSIS — Z8759 Personal history of other complications of pregnancy, childbirth and the puerperium: Secondary | ICD-10-CM | POA: Diagnosis present

## 2019-10-09 DIAGNOSIS — O114 Pre-existing hypertension with pre-eclampsia, complicating childbirth: Secondary | ICD-10-CM | POA: Diagnosis not present

## 2019-10-09 HISTORY — DX: Pre-existing hypertension with pre-eclampsia, unspecified trimester: O11.9

## 2019-10-09 LAB — FETAL NONSTRESS TEST

## 2019-10-09 LAB — COMPREHENSIVE METABOLIC PANEL
ALT: 15 U/L (ref 0–44)
AST: 14 U/L — ABNORMAL LOW (ref 15–41)
Albumin: 2.7 g/dL — ABNORMAL LOW (ref 3.5–5.0)
Alkaline Phosphatase: 137 U/L — ABNORMAL HIGH (ref 38–126)
Anion gap: 10 (ref 5–15)
BUN: <5 mg/dL — ABNORMAL LOW (ref 6–20)
CO2: 21 mmol/L — ABNORMAL LOW (ref 22–32)
Calcium: 9 mg/dL (ref 8.9–10.3)
Chloride: 104 mmol/L (ref 98–111)
Creatinine, Ser: 0.48 mg/dL (ref 0.44–1.00)
GFR calc Af Amer: >60 mL/min (ref >60)
GFR calc non Af Amer: >60 mL/min (ref >60)
Glucose, Bld: 89 mg/dL (ref 70–99)
Potassium: 3.2 mmol/L — ABNORMAL LOW (ref 3.5–5.1)
Sodium: 135 mmol/L (ref 135–145)
Total Bilirubin: 0.7 mg/dL (ref 0.3–1.2)
Total Protein: 6.2 g/dL — ABNORMAL LOW (ref 6.5–8.1)

## 2019-10-09 LAB — CBC
HCT: 36.3 % (ref 36.0–46.0)
Hemoglobin: 11.9 g/dL — ABNORMAL LOW (ref 12.0–15.0)
MCH: 30.4 pg (ref 26.0–34.0)
MCHC: 32.8 g/dL (ref 30.0–36.0)
MCV: 92.6 fL (ref 80.0–100.0)
Platelets: 327 10*3/uL (ref 150–400)
RBC: 3.92 MIL/uL (ref 3.87–5.11)
RDW: 13.9 % (ref 11.5–15.5)
WBC: 8.4 10*3/uL (ref 4.0–10.5)
nRBC: 0.2 % (ref 0.0–0.2)

## 2019-10-09 LAB — PROTEIN / CREATININE RATIO, URINE
Creatinine, Urine: 114.83 mg/dL
Protein Creatinine Ratio: 0.41 mg/mg{Cre} — ABNORMAL HIGH (ref 0.00–0.15)
Total Protein, Urine: 47 mg/dL

## 2019-10-09 LAB — TYPE AND SCREEN
ABO/RH(D): A NEG
Antibody Screen: NEGATIVE

## 2019-10-09 LAB — SARS CORONAVIRUS 2 BY RT PCR (HOSPITAL ORDER, PERFORMED IN ~~LOC~~ HOSPITAL LAB): SARS Coronavirus 2: NEGATIVE

## 2019-10-09 MED ORDER — LACTATED RINGERS IV SOLN: INTRAVENOUS | Status: DC

## 2019-10-09 MED ORDER — MISOPROSTOL 50MCG HALF TABLET
50.0000 ug | ORAL_TABLET | ORAL | Status: DC | PRN
  Filled 2019-10-09: qty 1

## 2019-10-09 MED ORDER — FENTANYL CITRATE (PF) 100 MCG/2ML IJ SOLN
100.0000 ug | INTRAMUSCULAR | Status: DC | PRN
  Administered 2019-10-10 (×2): 100 ug via INTRAVENOUS
  Filled 2019-10-09 (×3): qty 2

## 2019-10-09 MED ORDER — TERBUTALINE SULFATE 1 MG/ML IJ SOLN: 0.2500 mg | Freq: Once | INTRAMUSCULAR | Status: DC | PRN

## 2019-10-09 MED ORDER — MAGNESIUM SULFATE BOLUS VIA INFUSION
4.0000 g | Freq: Once | INTRAVENOUS | Status: AC
  Administered 2019-10-09: 4 g via INTRAVENOUS
  Filled 2019-10-09: qty 1000

## 2019-10-09 MED ORDER — LIDOCAINE HCL (PF) 1 % IJ SOLN: 30.0000 mL | INTRAMUSCULAR | Status: DC | PRN

## 2019-10-09 MED ORDER — OXYTOCIN BOLUS FROM INFUSION
333.0000 mL | Freq: Once | INTRAVENOUS | Status: AC
  Administered 2019-10-10: 333 mL via INTRAVENOUS

## 2019-10-09 MED ORDER — LACTATED RINGERS IV SOLN
500.0000 mL | INTRAVENOUS | Status: DC | PRN
  Administered 2019-10-10: 500 mL via INTRAVENOUS

## 2019-10-09 MED ORDER — ONDANSETRON HCL 4 MG/2ML IJ SOLN
4.0000 mg | Freq: Four times a day (QID) | INTRAMUSCULAR | Status: DC | PRN
  Administered 2019-10-10: 4 mg via INTRAVENOUS
  Filled 2019-10-09: qty 2

## 2019-10-09 MED ORDER — ACETAMINOPHEN 325 MG PO TABS
650.0000 mg | ORAL_TABLET | ORAL | Status: DC | PRN
  Administered 2019-10-09: 650 mg via ORAL
  Filled 2019-10-09: qty 2

## 2019-10-09 MED ORDER — PENICILLIN G POT IN DEXTROSE 60000 UNIT/ML IV SOLN
3.0000 10*6.[IU] | INTRAVENOUS | Status: DC
  Administered 2019-10-09 – 2019-10-10 (×4): 3 10*6.[IU] via INTRAVENOUS
  Filled 2019-10-09 (×5): qty 50

## 2019-10-09 MED ORDER — LABETALOL HCL 5 MG/ML IV SOLN: 80.0000 mg | INTRAVENOUS | Status: DC | PRN

## 2019-10-09 MED ORDER — HYDRALAZINE HCL 20 MG/ML IJ SOLN: 10.0000 mg | INTRAMUSCULAR | Status: DC | PRN

## 2019-10-09 MED ORDER — LABETALOL HCL 5 MG/ML IV SOLN: 40.0000 mg | INTRAVENOUS | Status: DC | PRN

## 2019-10-09 MED ORDER — MAGNESIUM SULFATE 40 GM/1000ML IV SOLN
2.0000 g/h | INTRAVENOUS | Status: DC
  Administered 2019-10-10: 2 g/h via INTRAVENOUS
  Filled 2019-10-09 (×2): qty 1000

## 2019-10-09 MED ORDER — SODIUM CHLORIDE 0.9 % IV SOLN
5.0000 10*6.[IU] | Freq: Once | INTRAVENOUS | Status: AC
  Administered 2019-10-09: 5 10*6.[IU] via INTRAVENOUS
  Filled 2019-10-09: qty 5

## 2019-10-09 MED ORDER — OXYCODONE-ACETAMINOPHEN 5-325 MG PO TABS
1.0000 | ORAL_TABLET | ORAL | Status: DC | PRN
  Administered 2019-10-10: 1 via ORAL
  Filled 2019-10-09: qty 1

## 2019-10-09 MED ORDER — OXYTOCIN-SODIUM CHLORIDE 30-0.9 UT/500ML-% IV SOLN
1.0000 m[IU]/min | INTRAVENOUS | Status: DC
  Administered 2019-10-09: 2 m[IU]/min via INTRAVENOUS
  Filled 2019-10-09: qty 500

## 2019-10-09 MED ORDER — OXYTOCIN-SODIUM CHLORIDE 30-0.9 UT/500ML-% IV SOLN
1.0000 m[IU]/min | INTRAVENOUS | Status: DC
  Filled 2019-10-09: qty 500

## 2019-10-09 MED ORDER — LABETALOL HCL 5 MG/ML IV SOLN: 20.0000 mg | INTRAVENOUS | Status: DC | PRN

## 2019-10-09 MED ORDER — SOD CITRATE-CITRIC ACID 500-334 MG/5ML PO SOLN: 30.0000 mL | ORAL | Status: DC | PRN

## 2019-10-09 MED ORDER — OXYCODONE-ACETAMINOPHEN 5-325 MG PO TABS: 2.0000 | ORAL_TABLET | ORAL | Status: DC | PRN

## 2019-10-09 MED ORDER — OXYTOCIN-SODIUM CHLORIDE 30-0.9 UT/500ML-% IV SOLN: 2.5000 [IU]/h | INTRAVENOUS | Status: DC

## 2019-10-09 NOTE — Patient Instructions (Signed)
Preeclampsia and Eclampsia Preeclampsia is a serious condition that may develop during pregnancy. This condition causes high blood pressure and increased protein in your urine along with other symptoms, such as headaches and vision changes. These symptoms may develop as the condition gets worse. Preeclampsia may occur at 20 weeks of pregnancy or later. Diagnosing and treating preeclampsia early is very important. If not treated early, it can cause serious problems for you and your baby. One problem it can lead to is eclampsia. Eclampsia is a condition that causes muscle jerking or shaking (convulsions or seizures) and other serious problems for the mother. During pregnancy, delivering your baby may be the best treatment for preeclampsia or eclampsia. For most women, preeclampsia and eclampsia symptoms go away after giving birth. In rare cases, a woman may develop preeclampsia after giving birth (postpartum preeclampsia). This usually occurs within 48 hours after childbirth but may occur up to 6 weeks after giving birth. What are the causes? The cause of preeclampsia is not known. What increases the risk? The following risk factors make you more likely to develop preeclampsia:  Being pregnant for the first time.  Having had preeclampsia during a past pregnancy.  Having a family history of preeclampsia.  Having high blood pressure.  Being pregnant with more than one baby.  Being 35 or older.  Being African-American.  Having kidney disease or diabetes.  Having medical conditions such as lupus or blood diseases.  Being very overweight (obese). What are the signs or symptoms? The most common symptoms are:  Severe headaches.  Vision problems, such as blurred or double vision.  Abdominal pain, especially upper abdominal pain. Other symptoms that may develop as the condition gets worse include:  Sudden weight gain.  Sudden swelling of the hands, face, legs, and feet.  Severe nausea  and vomiting.  Numbness in the face, arms, legs, and feet.  Dizziness.  Urinating less than usual.  Slurred speech.  Convulsions or seizures. How is this diagnosed? There are no screening tests for preeclampsia. Your health care provider will ask you about symptoms and check for signs of preeclampsia during your prenatal visits. You may also have tests that include:  Checking your blood pressure.  Urine tests to check for protein. Your health care provider will check for this at every prenatal visit.  Blood tests.  Monitoring your baby's heart rate.  Ultrasound. How is this treated? You and your health care provider will determine the treatment approach that is best for you. Treatment may include:  Having more frequent prenatal exams to check for signs of preeclampsia, if you have an increased risk for preeclampsia.  Medicine to lower your blood pressure.  Staying in the hospital, if your condition is severe. There, treatment will focus on controlling your blood pressure and the amount of fluids in your body (fluid retention).  Taking medicine (magnesium sulfate) to prevent seizures. This may be given as an injection or through an IV.  Taking a low-dose aspirin during your pregnancy.  Delivering your baby early. You may have your labor started with medicine (induced), or you may have a cesarean delivery. Follow these instructions at home: Eating and drinking   Drink enough fluid to keep your urine pale yellow.  Avoid caffeine. Lifestyle  Do not use any products that contain nicotine or tobacco, such as cigarettes and e-cigarettes. If you need help quitting, ask your health care provider.  Do not use alcohol or drugs.  Avoid stress as much as possible. Rest and get   plenty of sleep. General instructions  Take over-the-counter and prescription medicines only as told by your health care provider.  When lying down, lie on your left side. This keeps pressure off your  major blood vessels.  When sitting or lying down, raise (elevate) your feet. Try putting some pillows underneath your lower legs.  Exercise regularly. Ask your health care provider what kinds of exercise are best for you.  Keep all follow-up and prenatal visits as told by your health care provider. This is important. How is this prevented? There is no known way of preventing preeclampsia or eclampsia from developing. However, to lower your risk of complications and detect problems early:  Get regular prenatal care. Your health care provider may be able to diagnose and treat the condition early.  Maintain a healthy weight. Ask your health care provider for help managing weight gain during pregnancy.  Work with your health care provider to manage any long-term (chronic) health conditions you have, such as diabetes or kidney problems.  You may have tests of your blood pressure and kidney function after giving birth.  Your health care provider may have you take low-dose aspirin during your next pregnancy. Contact a health care provider if:  You have symptoms that your health care provider told you may require more treatment or monitoring, such as: ? Headaches. ? Nausea or vomiting. ? Abdominal pain. ? Dizziness. ? Light-headedness. Get help right away if:  You have severe: ? Abdominal pain. ? Headaches that do not get better. ? Dizziness. ? Vision problems. ? Confusion. ? Nausea or vomiting.  You have any of the following: ? A seizure. ? Sudden, rapid weight gain. ? Sudden swelling in your hands, ankles, or face. ? Trouble moving any part of your body. ? Numbness in any part of your body. ? Trouble speaking. ? Abnormal bleeding.  You faint. Summary  Preeclampsia is a serious condition that may develop during pregnancy.  This condition causes high blood pressure and increased protein in your urine along with other symptoms, such as headaches and vision  changes.  Diagnosing and treating preeclampsia early is very important. If not treated early, it can cause serious problems for you and your baby.  Get help right away if you have symptoms that your health care provider told you to watch for. This information is not intended to replace advice given to you by your health care provider. Make sure you discuss any questions you have with your health care provider. Document Revised: 09/19/2017 Document Reviewed: 08/24/2015 [employer] Patient Education  2020 [employer]  

## 2019-10-09 NOTE — MAU Provider Note (Signed)
History     CSN: [id number]  Arrival date and time: 10/09/19 1147   First Provider Initiated Contact with Patient 10/09/19 1303      Chief Complaint  Patient presents with  . Headache  . Hypertension  . seeing spots   Julia Potter is a 29 y.o. [id number] at [redacted]w[redacted]d who presents to MAU for preeclampsia evaluation after her blood pressure was elevated at the clinic. Patient also endorses a headache that started this morning that she rates as 5/10 and has not taken anything for treatment. Patient reports HA has been worsening since this morning. Patient also endorses intermittent headaches for the past week, which resolve with rest. Patient also endorses seeing spots that are intermittent, unsure if they are bilateral and describes them as black, floating spots. Patient also endorses intermittent sharp pains in RUQ, which also started in the last week. Patient has a history of PEC that necessitated delivery at 36 weeks.  Pt reports she was told she has a hx of cHTN, but has not been on medication for it this pregnancy, but was on medication up until she found out she was pregnant.  Pt denies blurry vision, N/V, epigastric pain, swelling in face and hands, sudden weight gain. Pt denies chest pain and SOB.  Pt denies constipation, diarrhea, or urinary problems. Pt denies fever, chills, fatigue, sweating or changes in appetite. Pt denies dizziness, light-headedness, weakness.  Pt denies VB, ctx, LOF and reports good FM.  Current pregnancy problems? Hx PEC and PEC this pregnancy, RH negative, hx HSV Blood Type? A NEGATIVE Allergies? NKDA Current medications? Low dose ASA, Valtrex, Benadryl, PNVs Current PNC & next appt? [facility], 10/16/2019   OB History    Gravida  5   Para  1   Term  0   Preterm  1   AB  3   Living  1     SAB  3   TAB  0   Ectopic  0   Multiple  0   Live Births  1        Obstetric Comments  G1P0101 Admitted to [facility] for PIH, IOL. Delivered a  viable female at 34 weeks        Past Medical History:  Diagnosis Date  . Abnormal uterine bleeding (AUB) 01/20/2016  . ALLERGIC RHINITIS   . Anxiety    no meds  . Chlamydia   . Depression    doing ok, in therapy - no meds  . Herpes   . HSV infection   . Pregnancy induced hypertension 2010   resolved after pregnancy  . Rh negative state in antepartum period 05/10/2017   [ ] Rhophylac  . Seasonal allergies     Past Surgical History:  Procedure Laterality Date  . DILATION AND EVACUATION N/A 05/23/2017   Procedure: DILATATION AND EVACUATION;  Surgeon: 05/25/2017, MD;  Location: [facility];  Service: Gynecology;  Laterality: N/A;  needs Cisne Bing  . INTRAUTERINE DEVICE INSERTION  09/2010  . IUD REMOVAL      Family History  Problem Relation Age of Onset  . Hypertension Mother   . Diabetes Maternal Grandmother   . Asthma Daughter     Social History   Tobacco Use  . Smoking status: Never Smoker  . Smokeless tobacco: Never Used  Vaping Use  . Vaping Use: Never used  Substance Use Topics  . Alcohol use: Not Currently    Comment: socially  . Drug use: No    Allergies: No  Known Allergies  Medications Prior to Admission  Medication Sig Dispense Refill Last Dose  . aspirin EC 81 MG tablet Take 1 tablet (81 mg total) by mouth daily. Take after 12 weeks for prevention of preeclampsia later in pregnancy 300 tablet 2 10/09/2019 at Unknown time  . Prenatal Vit-Fe Fumarate-FA (MULTIVITAMIN-PRENATAL) 27-0.8 MG TABS tablet Take 1 tablet by mouth daily at 12 noon.   10/09/2019 at Unknown time  . valACYclovir (VALTREX) 1000 MG tablet Take 1 tablet (1,000 mg total) by mouth daily. Start at [redacted] weeks gestation and continue until time of delivery. 30 tablet 1 10/09/2019 at Unknown time  . diphenhydrAMINE (BENADRYL) 25 MG tablet Take 25 mg by mouth daily as needed for allergies. (Patient not taking: Reported on 10/09/2019)       Review of Systems  Constitutional: Negative for chills, diaphoresis,  fatigue and fever.  Eyes: Positive for visual disturbance.  Respiratory: Negative for shortness of breath.   Cardiovascular: Negative for chest pain.  Gastrointestinal: Positive for abdominal pain. Negative for constipation, diarrhea, nausea and vomiting.  Genitourinary: Negative for dysuria, flank pain, frequency, pelvic pain, urgency, vaginal bleeding and vaginal discharge.  Neurological: Positive for headaches. Negative for dizziness, weakness and light-headedness.   Physical Exam   Blood pressure (!) 146/93, pulse (!) 114, temperature 98.6 F (37 C), temperature source Oral, resp. rate 18, height  (1.575 m), weight 107.5 kg, last menstrual period 01/31/2019, SpO2 100 %.  Patient Vitals for the past 24 hrs:  BP Temp Temp src Pulse Resp SpO2 Height Weight  10/09/19 1315 (!) 146/93 -- -- (!) 114 -- -- -- --  10/09/19 1251 (!) 154/91 -- -- (!) 114 -- -- -- --  10/09/19 1209 (!) 146/93 98.6 F (37 C) Oral (!) 123 18 100 %  (1.575 m) 107.5 kg  10/09/19 1207 -- -- -- -- -- 100 % -- --   Physical Exam Vitals and nursing note reviewed.  Constitutional:      General: She is not in acute distress.    Appearance: Normal appearance. She is not ill-appearing, toxic-appearing or diaphoretic.  HENT:     Head: Normocephalic and atraumatic.  Pulmonary:     Effort: Pulmonary effort is normal.  Neurological:     Mental Status: She is alert and oriented to person, place, and time.  Psychiatric:        Mood and Affect: Mood normal.        Behavior: Behavior normal.        Thought Content: Thought content normal.        Judgment: Judgment normal.    No results found for this or any previous visit (from the past 24 hour(s)).  [place] MFM FETAL BPP WO NON STRESS  Result Date: 10/01/2019 ----------------------------------------------------------------------  OBSTETRICS REPORT                       (Signed Final 10/01/2019 12:55 pm)  ---------------------------------------------------------------------- Patient Info  ID #:       [id number]                          D.O.B.:  Dec 04, 1990 (29 yrs)  Name:       Julia Potter                Visit Date: 10/01/2019 10:23 am ---------------------------------------------------------------------- Performed By  Attending:        Ma Rings MD  Secondary Phy.:   [facility]                                                             [facility]  Performed By:     Aundra Millet            Address:          [address]                    [place]                                                             [place], [place]                                                             [address]  Referred By:      Hermina Staggers        Location:         [facility]                    MD                                       [facility]                                                             [facility]                                                             [facility]  Ref. Address:     [address]                    [place], [place]                    [address] ---------------------------------------------------------------------- Orders  #  Description                           Code        Ordered By  1  [place] MFM FETAL BPP WO NON               09811.91    Luna Kitchens  STRESS ----------------------------------------------------------------------  #  Order #                     Accession #                Episode #  1  [id number]                   [id number]                 [id number] ---------------------------------------------------------------------- Indications  Hypertension - Chronic/Pre-existing(No         O10.019  Meds)  Obesity complicating pregnancy, second         O99.212  trimester (pregravid BMI 38)  Rh negative state in antepartum                O36.0190  Poor obstetric history: Previous               O09.299  preeclampsia / eclampsia/gestational HTN  Low  Risk NIPS (Negative AFP)(Negative  Horizon)  [redacted] weeks gestation of pregnancy                Z3A.34  Poor obstetric history: Previous preterm       O09.219  delivery, antepartum (34 wks pre-e) ---------------------------------------------------------------------- Vital Signs  Weight (lb): 235                               Height:        5'2"  BMI:         42.98 ---------------------------------------------------------------------- Fetal Evaluation  Num Of Fetuses:         1  Fetal Heart Rate(bpm):  145  Cardiac Activity:       Observed  Presentation:           Cephalic  Placenta:               Posterior  P. Cord Insertion:      Previously Visualized  Amniotic Fluid  AFI FV:      Within normal limits  AFI Sum(cm)     %Tile       Largest Pocket(cm)  9.96            20          3.84  RUQ(cm)       RLQ(cm)       LUQ(cm)        LLQ(cm)  3.2           1.94          3.84           0.98 ---------------------------------------------------------------------- Biophysical Evaluation  Amniotic F.V:   Pocket => 2 cm             F. Tone:        Observed  F. Movement:    Observed                   Score:          8/8  F. Breathing:   Observed ---------------------------------------------------------------------- OB History  Gravidity:    5         Term:   1        Prem:   0        SAB:   3  TOP:          0       Ectopic:  0        Living: 1 ---------------------------------------------------------------------- Gestational Age  LMP:           34w 5d        Date:  01/31/19                 EDD:   11/07/19  Best:          34w 5d     Det. By:  LMP  (01/31/19)          EDD:   11/07/19 ---------------------------------------------------------------------- Anatomy  Stomach:               Visualized             Bladder:                Visualized ---------------------------------------------------------------------- Cervix Uterus Adnexa  Cervix  Not visualized (advanced GA >24wks)  ---------------------------------------------------------------------- Comments  This patient was seen for a biophysical profile as she reports  that she was diagnosed with preeclampsia about 2 weeks  ago.  She reports that she was hospitalized last week due to  preeclampsia during which time she received a complete  course of antenatal corticosteroids.  Her blood pressure in  our office today was 131/80.  She is not currently treated with  any antihypertensive medications.  A biophysical profile performed today was 8 out of 8.  There  was normal amniotic fluid noted today.  Due to preeclampsia, she already has a delivery scheduled at  around 37 weeks on October 17, 2019.  We will continue to follow her with weekly biophysical profiles  until delivery.  Preeclampsia precautions were reviewed  today.  She will return in 1 week for another biophysical profile. ----------------------------------------------------------------------                   Ma RingsVictor Fang, MD Electronically Signed Final Report   10/01/2019 12:55 pm ----------------------------------------------------------------------  US MFM OB FOLLOW UP  Result Date: 09/12/2019 ----------------------------------------------------------------------  OBSTETRICS REPORT                       (Signed Final 09/12/2019 08:26 am) ---------------------------------------------------------------------- Patient Info  ID #:       [id number]                          D.O.B.:  [birth year] (29 yrs)  Name:       Julia Potter                Visit Date: 09/12/2019 07:46 am ---------------------------------------------------------------------- Performed By  Attending:        Noralee Spaceavi Shankar MD        Secondary Phy.:   [facility]                                                             for [facility]  Performed By:     Sandi MealyJovancia Adrien        Address:          [address]                    [place]  [place], [place]                                                              [address]  Referred By:      Hermina Staggers        Location:         [facility]                    MD                                       [facility]                                                             [facility]                                                             [facility]  Ref. Address:     [address]                    [place], [place]                    [address] ---------------------------------------------------------------------- Orders  #  Description                           Code        Ordered By  1  [place] MFM OB FOLLOW UP                   09811.91    Noralee Space ----------------------------------------------------------------------  #  Order #                     Accession #                Episode #  1  [id number]                   [id number]                 [id number] ---------------------------------------------------------------------- Indications  Encounter for other antenatal screening        Z36.2  follow-up  Obesity complicating pregnancy, second         O99.212  trimester (pregravid BMI 38)  [redacted] weeks gestation of pregnancy                Z3A.32  Rh negative state in antepartum                O36.0190  Poor obstetric history: Previous  O09.299  preeclampsia / eclampsia/gestational HTN  Low Risk NIPS (Negative AFP)(Negative  Horizon)  Poor obstetric history: Previous preterm       O09.219  delivery, antepartum (34 wks pre-e) ---------------------------------------------------------------------- Vital Signs                                                 Height:        5'2" ---------------------------------------------------------------------- Fetal Evaluation  Num Of Fetuses:         1  Fetal Heart Rate(bpm):  146  Cardiac Activity:       Observed  Presentation:           Cephalic  Placenta:               Posterior  P. Cord Insertion:      Previously Visualized  Amniotic Fluid  AFI FV:       Within normal limits  AFI Sum(cm)     %Tile       Largest Pocket(cm)  14.53           51          4.4  RUQ(cm)       RLQ(cm)       LUQ(cm)        LLQ(cm)  4.4           4.16          2.97           3 ---------------------------------------------------------------------- Biometry  BPD:      82.9  mm     G. Age:  33w 2d         80  %    CI:         78.1   %    70 - 86                                                          FL/HC:      21.1   %    19.1 - 21.3  HC:      296.8  mm     G. Age:  32w 6d         34  %    HC/AC:      1.07        0.96 - 1.17  AC:      277.4  mm     G. Age:  31w 6d         42  %    FL/BPD:     75.4   %    71 - 87  FL:       62.5  mm     G. Age:  32w 3d         47  %    FL/AC:      22.5   %    20 - 24  HUM:      56.5  mm     G. Age:  32w 6d         67  %  LV:        4.1  mm  Est. FW:  1924  gm      4 lb 4 oz     45  % ---------------------------------------------------------------------- OB History  Gravidity:    5         Term:   1        Prem:   0        SAB:   3  TOP:          0       Ectopic:  0        Living: 1 ---------------------------------------------------------------------- Gestational Age  LMP:           32w 0d        Date:  01/31/19                 EDD:   11/07/19  U/S Today:     32w 4d                                        EDD:   11/03/19  Best:          32w 0d     Det. By:  LMP  (01/31/19)          EDD:   11/07/19 ---------------------------------------------------------------------- Anatomy  Cranium:               Appears normal         Aortic Arch:            Previously seen  Cavum:                 Appears normal         Ductal Arch:            Previously seen  Ventricles:            Appears normal         Diaphragm:              Appears normal  Choroid Plexus:        Previously seen        Stomach:                Appears normal, left                                                                        sided  Cerebellum:            Previously seen        Abdomen:                 Appears normal  Posterior Fossa:       Previously seen        Abdominal Wall:         Previously seen  Nuchal Fold:           Not applicable (>20    Cord Vessels:           Previously seen                         wks GA)  Face:  Orbits and profile     Kidneys:                Appear normal                         previously seen  Lips:                  Previously seen        Bladder:                Appears normal  Thoracic:              Appears normal         Spine:                  Previously seen  Heart:                 Appears normal         Upper Extremities:      Previously seen                         (4CH, axis, and                         situs)  RVOT:                  Previously seen        Lower Extremities:      Previously seen  LVOT:                  Previously seen  Other:  Heels visualized. Nasal bone visualized. ---------------------------------------------------------------------- Impression  Maternal obesity.  Patient return for fetal growth assessment.  Obstetric history is significant for a preterm vaginal delivery at  [redacted] weeks gestation.  Her pregnancy was complicated by  preeclampsia.  Blood pressure today at our office is 139/90  mmHg.  Patient does not have symptoms of headache or  visual disturbances or upper abdominal pain.  Patient takes low-dose of aspirin prophylaxis.  Amniotic fluid is normal and good fetal activity is seen .Fetal  growth is appropriate for gestational age .  Cephalic  presentation.  We reassured the patient of the findings.  She has blood  pressure cuff at home and I encouraged her to check her  blood pressures twice weekly.  I discussed normal blood  pressure parameters. ---------------------------------------------------------------------- Recommendations  Follow-up scans as clinically indicated. ----------------------------------------------------------------------                  Noralee Space, MD Electronically Signed Final Report   09/12/2019  08:26 am ----------------------------------------------------------------------   MAU Course  Procedures  MDM -consulted with Dr. Macon Large, given patient's preexisting dx of PEC on 09/17/2019 by PCr and severe features today, will admit to L&D for delivery -V. Katrinka Blazing, CNM notified -EFM: non-reactive d/t short time on monitor in MAU       -baseline: 145 (previous tachycardia on strip)       -variability: moderate       -accels: absent       -decels: absent       -TOCO: quiet -admit to L&D for delivery  Orders Placed This Encounter  Procedures  . CBC    Standing Status:   Standing    Number of Occurrences:   1  . Comprehensive metabolic panel    Standing Status:  Standing    Number of Occurrences:   1  . Protein / creatinine ratio, urine    Standing Status:   Standing    Number of Occurrences:   1  . RPR    Standing Status:   Standing    Number of Occurrences:   1  . Order Rapid HIV per protocol if no results on chart    Standing Status:   Standing    Number of Occurrences:   1  . Type and screen         Standing Status:   Standing    Number of Occurrences:   1  . Insert and maintain IV Line    Standing Status:   Standing    Number of Occurrences:   1   Meds ordered this encounter  Medications  . lactated ringers infusion    Assessment and Plan   1. Chronic hypertension with superimposed pre-eclampsia   2. [redacted] weeks gestation of pregnancy   3. NST (non-stress test) reactive    -admit to L&D for delivery  Odie Sera Avril Busser 10/09/2019, 1:52 PM

## 2019-10-09 NOTE — MAU Note (Signed)
Has pre-eclampsia, sent from clinic for further eval.  Has a headache, seeing floaters, denies epigastric pain or increase in swelling.  Denies vag bleeding or LOF, reports +FM

## 2019-10-09 NOTE — Progress Notes (Signed)
Labor Progress Note Nadiah I Kirkwood is a 29 y.o. [id number] at [redacted]w[redacted]d presented for IOL for pre-eclampsia with SF  S: Patient is feeling some pressure but no pain. Does not feel her contractions very much. Previously received Tylenol for a headache but no complaints at this time.   O:  BP (!) 141/85   Pulse (!) 111   Temp 98.7 F (37.1 C) (Oral)   Resp 18   Ht 5\' 2"  (1.575 m)   Wt 107.5 kg   LMP 01/31/2019   SpO2 100%   BMI 43.35 kg/m  EFM: baseline 135/moderate variability/+accels, no decels  CVE: Dilation: 3 Effacement (%): 70 Station: -1 Presentation: Vertex Exam by:: Dr. [email or web address] RN   A&P: 29 y.o. 34 [redacted]w[redacted]d here for IOL for pre-eclampsia with SF #Labor: Progressing well. On Pit at 83mL/hr. Continue to titrate Pit up as appropriate. Could consider stopping Pit and giving cytotec if still not progressing at next check. #Pain: Per patient request #FWB: Cat I #GBS Uknown, on PCN d/t pre-term #Pre-E with severe features: On Mg. Continues to have elevated pressures to 150's systolic. Monitor BP's, treat >160/110.  #cHTN: Not on medications. 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 Patient denies recent outbreak, will proceed with VD  9m, DO 8:00 PM

## 2019-10-09 NOTE — Progress Notes (Signed)
Pt states she has headaches 2 times per week. She has been seeing spots daily and denies blurry vision or dizziness. She endorses having intermittent RUQ pain and soreness.

## 2019-10-09 NOTE — H&P (Addendum)
OBSTETRIC ADMISSION HISTORY AND PHYSICAL  Julia Potter is a 29 y.o. female [phone] with IUP at [redacted]w[redacted]d by LMP presenting for IOL for pre-eclampsia with severe features (headache, seeing black spots, RUQ pain). She reports +FMs, No LOF, no VB, no blurry vision, or peripheral edema.  She plans on breast and bottle feeding. She is unsure for birth control. 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 Abnormal uterine bleeding (AUB) 01/20/2016  . ALLERGIC RHINITIS   . Anxiety    no meds  . Chlamydia   . Depression    doing ok, in therapy - no meds  . Herpes   . HSV infection   . Pregnancy induced hypertension 2010   resolved after pregnancy  . Rh negative state in antepartum period 05/10/2017   [ ] Rhophylac  . Seasonal allergies     Past Surgical History: Past Surgical History:  Procedure Laterality Date  . DILATION AND EVACUATION N/A 05/23/2017   Procedure: DILATATION AND EVACUATION;  Surgeon: , MD;  Location: [facility];  Service: Gynecology;  Laterality: N/A;  needs 05/25/2017  . INTRAUTERINE DEVICE INSERTION  09/2010  . IUD REMOVAL      Obstetrical History: OB History     Gravida  5   Para  1   Term  0   Preterm  1   AB  3   Living  1      SAB  3   TAB  0   Ectopic  0   Multiple  0   Live Births  1        Obstetric Comments  G1P0101 Admitted to [facility] for PIH, IOL. Delivered a viable female at 58 weeks         Social History Social History   Socioeconomic History  . Marital status: Single    Spouse name: Not on file  . Number of children: Not on file  . Years of education: Not on file  . Highest education  level: Not on file  Occupational History  . Not on file  Tobacco Use  . Smoking status: Never Smoker  . Smokeless tobacco: Never Used  Vaping Use  . Vaping Use: Never used  Substance and Sexual Activity  . Alcohol use: Not Currently    Comment: socially  . Drug use: No  . Sexual activity: Yes    Birth control/protection: None  Other Topics Concern  . Not on file  Social History Narrative   Lives with mom, sister and brother   Has one daughter born in [place]   Wants to go to law school   Social Determinants of Health   Financial Resource Strain:   . Difficulty of Paying Living Expenses: Not on file  Food Insecurity: No Food Insecurity  . Worried About [place]: Never true  . Ran Out of Food in the Last [place]: Never true  Transportation Needs: No Transportation Needs  . Lack of Transportation (Medical): No  . Lack of Transportation (Non-Medical): No  Physical Activity:   . Days of Exercise per Week: Not on file  . Minutes  of Exercise per Session: Not on file  Stress:   . Feeling of Stress : Not on file  Social Connections:   . Frequency of Communication with Friends and Family: Not on file  . Frequency of Social Gatherings with Friends and Family: Not on file  . Attends Religious Services: Not on file  . Active Member of Clubs or Organizations: Not on file  . Attends Banker Meetings: Not on file  . Marital Status: Not on file    Family History: Family History  Problem Relation Age of Onset  . Hypertension Mother   . Diabetes Maternal Grandmother   . Asthma Daughter     Allergies: No Known Allergies  Medications Prior to Admission  Medication Sig Dispense Refill Last Dose  . aspirin EC 81 MG tablet Take 1 tablet (81 mg total) by mouth daily. Take after 12 weeks for prevention of preeclampsia later in pregnancy 300 tablet 2 10/09/2019 at Unknown time  . Prenatal Vit-Fe Fumarate-FA (MULTIVITAMIN-PRENATAL) 27-0.8  MG TABS tablet Take 1 tablet by mouth daily at 12 noon.   10/09/2019 at Unknown time  . valACYclovir (VALTREX) 1000 MG tablet Take 1 tablet (1,000 mg total) by mouth daily. Start at [redacted] weeks gestation and continue until time of delivery. 30 tablet 1 10/09/2019 at Unknown time  . diphenhydrAMINE (BENADRYL) 25 MG tablet Take 25 mg by mouth daily as needed for allergies. (Patient not taking: Reported on 10/09/2019)        Review of Systems   All systems reviewed and negative except as stated in HPI  Blood pressure 134/81, pulse (!) 111, temperature 98.7 F (37.1 C), temperature source Oral, resp. rate 17, height 5\' 2"  (1.575 m), weight 107.5 kg, last menstrual period 01/31/2019, SpO2 100 %. General appearance: alert, cooperative and no distress Lungs: clear to auscultation bilaterally Heart: regular rate and rhythm Abdomen: soft, non-tender; bowel sounds normal Pelvic: deferred Extremities: Homans sign is negative, no sign of DVT Presentation: cephalic Fetal monitoringBaseline: 135 bpm, Variability: Good {> 6 bpm), Accelerations: Reactive and Decelerations: Absent Uterine activity: Not able to visualize good contractions on monitor, patient denies feeling contractions Dilation: 3 Effacement (%): 70 Station: -1 Exam by:: [email or web address] MD   Prenatal labs: ABO, Rh: --/--/A NEG (09/08 1402) Antibody: PENDING (09/08 1402) Rubella: <0.90 (03/25 1200) RPR: Non Reactive (07/16 0840)  HBsAg: Negative (03/25 1200)  HIV: Non Reactive (07/16 0840)  GBS:   Unknown. Pre-term, will start PCN 2 hr GTT: 72/132/91 Genetic screening: Low-risk NIPS Anatomy 06-24-1976: normal  Prenatal Transfer Tool  Maternal Diabetes: No Genetic Screening: Normal Maternal Ultrasounds/Referrals: Normal Fetal Ultrasounds or other Referrals:  Referred to Materal Fetal Medicine  Maternal Substance Abuse:  No Significant Maternal Medications:  None Significant Maternal Lab Results: Other: GBS unknown, pre-term- will treat  Results  for orders placed or performed during the hospital encounter of 10/09/19 (from the past 24 hour(s))  Type and screen   Collection Time: 10/09/19  2:02 PM  Result Value Ref Range   ABO/RH(D) A NEG    Antibody Screen PENDING    Sample Expiration      10/12/2019,2359 Performed at [facility], [address], [place], [place]   Protein / creatinine ratio, urine   Collection Time: 10/09/19  2:15 PM  Result Value Ref Range   Creatinine, Urine 114.83 mg/dL   Total Protein, Urine 47 mg/dL   Protein Creatinine Ratio 0.41 (H) 0.00 - 0.15 mg/mg[Cre]  CBC   Collection Time:  10/09/19  2:16 PM  Result Value Ref Range   WBC 8.4 4.0 - 10.5 K/uL   RBC 3.92 3.87 - 5.11 MIL/uL   Hemoglobin 11.9 (L) 12.0 - 15.0 g/dL   HCT 91.4 36 - 46 %   MCV 92.6 80.0 - 100.0 fL   MCH 30.4 26.0 - 34.0 pg   MCHC 32.8 30.0 - 36.0 g/dL   RDW 78.2 95.6 - 21.3 %   Platelets 327 150 - 400 K/uL   nRBC 0.2 0.0 - 0.2 %  Comprehensive metabolic panel   Collection Time: 10/09/19  2:16 PM  Result Value Ref Range   Sodium 135 135 - 145 mmol/L   Potassium 3.2 (L) 3.5 - 5.1 mmol/L   Chloride 104 98 - 111 mmol/L   CO2 21 (L) 22 - 32 mmol/L   Glucose, Bld 89 70 - 99 mg/dL   BUN <5 (L) 6 - 20 mg/dL   Creatinine, Ser 0.86 0.44 - 1.00 mg/dL   Calcium 9.0 8.9 - 57.8 mg/dL   Total Protein 6.2 (L) 6.5 - 8.1 g/dL   Albumin 2.7 (L) 3.5 - 5.0 g/dL   AST 14 (L) 15 - 41 U/L   ALT 15 0 - 44 U/L   Alkaline Phosphatase 137 (H) 38 - 126 U/L   Total Bilirubin 0.7 0.3 - 1.2 mg/dL   GFR calc non Af Amer >60 >60 mL/min   GFR calc Af Amer >60 >60 mL/min   Anion gap 10 5 - 15  SARS Coronavirus 2 by RT PCR (hospital order, performed in [facility] hospital lab) Nasopharyngeal Nasopharyngeal Swab   Collection Time: 10/09/19  2:17 PM   Specimen: Nasopharyngeal Swab  Result Value Ref Range   SARS Coronavirus 2 NEGATIVE NEGATIVE    Patient Active Problem List   Diagnosis Date Noted  . Chronic hypertension with  superimposed preeclampsia 10/09/2019  . Chronic hypertension in pregnancy 09/29/2019  . [redacted] weeks gestation of pregnancy 09/17/2019  . Elevated blood pressure affecting pregnancy in third trimester, antepartum 09/17/2019  . Excessive weight gain in pregnancy in third trimester 09/17/2019  . Abnormal TSH 05/08/2019  . History of preterm delivery 04/25/2019  . History of PCOS 04/25/2019  . Supervision of high risk pregnancy, antepartum 04/18/2019  . History of pregnancy induced hypertension 04/18/2019  . Obesity 04/18/2019  . Obesity in pregnancy 04/18/2019  . Rh negative state in antepartum period 04/18/2019  . Herpes   . History of multiple miscarriages   . BMI 40.0-44.9, adult (HCC) 06/12/2017  . Hirsutism 09/14/2015  . Psychiatric illness 11/01/2013    Assessment/Plan:  SARAN LAVIOLETTE is a 29 y.o. [id number] at [redacted]w[redacted]d here for IOL for pre-eclampsia with severe features.   #Labor: Patient presented for IOL for pre-eclampsia with severe features. Will begin induction with Pitocin due to favorable cervix. Plan for reassess in 4h or sooner if indicated  #Pain: Per patient request #FWB: Cat I #ID:  GBS unknown, will treat with PCN given pre-term #MOF: breast and bottle #MOC: Unsure, will plan to reassess after delivery  #Circ:  N/A #Pre-E with severe features: SF are headaches, vision changes, RUQ pain. Start Mg. Protein creatinine ratio 0.41 today, platelets, AST/ALT WNL. Elevated BPs in MAU to 154/91. Monitor BP's, treat >160/110.  #cHTN: Not on medications. 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 Patient denies recent outbreak, will proceed with VD  Sabino Dick, DO  10/09/2019, 4:01 PM  OB FELLOW HISTORY AND PHYSICAL ATTESTATION  I have seen and examined this patient; I agree with above documentation in the resident's note.   Marcy Sirenatherine Mckinleigh Schuchart, D.O. OB Fellow  10/09/2019, 7:08 PM

## 2019-10-09 NOTE — Progress Notes (Signed)
   PRENATAL VISIT NOTE  Subjective:  Julia Potter is a 29 y.o. [id number] at [redacted]w[redacted]d being seen today for ongoing prenatal care.  She is currently monitored for the following issues for this high-risk pregnancy and has Psychiatric illness; Hirsutism; BMI 40.0-44.9, adult (HCC); Supervision of high risk pregnancy, antepartum; History of pregnancy induced hypertension; Obesity; Obesity in pregnancy; Herpes; History of multiple miscarriages; Rh negative state in antepartum period; History of preterm delivery; History of PCOS; Abnormal TSH; [redacted] weeks gestation of pregnancy; Elevated blood pressure affecting pregnancy in third trimester, antepartum; Excessive weight gain in pregnancy in third trimester; and Chronic hypertension in pregnancy on their problem list.  Patient reports headache and RUQ pain.  Contractions: Irregular. Vag. Bleeding: None.  Movement: Present. Denies leaking of fluid.   The following portions of the patient's history were reviewed and updated as appropriate: allergies, current medications, past family history, past medical history, past social history, past surgical history and problem list.   Objective:   Vitals:   10/09/19 0954  BP: (!) 141/94  Pulse: (!) 111  Weight: 237 lb (107.5 kg)    Fetal Status: Fetal Heart Rate (bpm): NST   Movement: Present  Presentation: Vertex  General:  Alert, oriented and cooperative. Patient is in no acute distress.  Skin: Skin is warm and dry. No rash noted.   Cardiovascular: Normal heart rate noted  Respiratory: Normal respiratory effort, no problems with respiration noted  Abdomen: Soft, gravid, appropriate for gestational age.  Pain/Pressure: Present     Pelvic: Cervical exam deferred        Extremities: Normal range of motion.  Edema: None  Mental Status: Normal mood and affect. Normal behavior. Normal judgment and thought content.   Assessment and Plan:  Pregnancy: G5P0131 at [redacted]w[redacted]d 1. Chronic hypertension with superimposed  pre-eclampsia reactive - US FETAL BPP W/NONSTRESS; Future  2. Chronic hypertension in pregnancy Elevated BP, preeclampsia, possible severe features  3. [redacted] weeks gestation of pregnancy *[redacted]w[redacted]d   4. Elevated blood pressure affecting pregnancy in third trimester, antepartum   5. Supervision of high risk pregnancy, antepartum To MAU for preE w/u  Preterm labor symptoms and general obstetric precautions including but not limited to vaginal bleeding, contractions, leaking of fluid and fetal movement were reviewed in detail with the patient. Please refer to After Visit Summary for other counseling recommendations.   Return in about 1 week (around 10/16/2019).  Future Appointments  Date Time Provider Department Center  10/10/2019 12:45 PM [facility] NURSE [facility]  10/10/2019  1:00 PM [facility] US1 [facility]  10/15/2019  9:15 AM [facility]  10/15/2019 10:30 AM [facility] NURSE [facility]  10/15/2019 10:45 AM [facility] US5 [facility]  10/15/2019 12:30 PM MC-SCREENING [facility]-SDSC None  10/16/2019  9:15 AM [facility] NST [facility]  10/17/2019  6:30 AM MC-LD SCHED ROOM MC-INDC None    Scheryl Darter, MD

## 2019-10-09 NOTE — Progress Notes (Signed)
Patient ID: Julia Potter, female   DOB: 1990-08-28, 29 y.o.   MRN: [id number] Doing well Not having much pain yet  Vitals:   10/09/19 2000 10/09/19 2030 10/09/19 2100 10/09/19 2130  BP: (!) 145/83 (!) 141/87 (!) 145/80 136/85  Pulse: (!) 108 (!) 111 (!) 110 (!) 108  Resp:  17 18 18   Temp:  98.9 F (37.2 C)    TempSrc:  Oral    SpO2:      Weight:      Height:       FHR reassuring UCs irregular  Cervix exam deferred  Continue plan of care

## 2019-10-10 ENCOUNTER — Encounter: Payer: Self-pay | Admitting: General Practice

## 2019-10-10 ENCOUNTER — Ambulatory Visit: Payer: Medicaid Other

## 2019-10-10 ENCOUNTER — Encounter (HOSPITAL_COMMUNITY): Payer: Self-pay | Admitting: Family Medicine

## 2019-10-10 ENCOUNTER — Inpatient Hospital Stay (HOSPITAL_COMMUNITY): Payer: Medicaid Other | Admitting: Anesthesiology

## 2019-10-10 DIAGNOSIS — O164 Unspecified maternal hypertension, complicating childbirth: Secondary | ICD-10-CM | POA: Diagnosis not present

## 2019-10-10 DIAGNOSIS — D649 Anemia, unspecified: Secondary | ICD-10-CM | POA: Diagnosis not present

## 2019-10-10 DIAGNOSIS — O1414 Severe pre-eclampsia complicating childbirth: Secondary | ICD-10-CM

## 2019-10-10 DIAGNOSIS — O9902 Anemia complicating childbirth: Secondary | ICD-10-CM | POA: Diagnosis not present

## 2019-10-10 DIAGNOSIS — Z8759 Personal history of other complications of pregnancy, childbirth and the puerperium: Secondary | ICD-10-CM | POA: Diagnosis not present

## 2019-10-10 DIAGNOSIS — Z3A36 36 weeks gestation of pregnancy: Secondary | ICD-10-CM

## 2019-10-10 LAB — CBC
HCT: 35.4 % — ABNORMAL LOW (ref 36.0–46.0)
HCT: 34.1 % — ABNORMAL LOW (ref 36.0–46.0)
Hemoglobin: 11.7 g/dL — ABNORMAL LOW (ref 12.0–15.0)
Hemoglobin: 11.4 g/dL — ABNORMAL LOW (ref 12.0–15.0)
MCH: 30.5 pg (ref 26.0–34.0)
MCH: 31.1 pg (ref 26.0–34.0)
MCHC: 33.1 g/dL (ref 30.0–36.0)
MCHC: 33.4 g/dL (ref 30.0–36.0)
MCV: 92.2 fL (ref 80.0–100.0)
MCV: 92.9 fL (ref 80.0–100.0)
Platelets: 299 10*3/uL (ref 150–400)
Platelets: 298 10*3/uL (ref 150–400)
RBC: 3.84 MIL/uL — ABNORMAL LOW (ref 3.87–5.11)
RBC: 3.67 MIL/uL — ABNORMAL LOW (ref 3.87–5.11)
RDW: 14 % (ref 11.5–15.5)
RDW: 14 % (ref 11.5–15.5)
WBC: 10.3 10*3/uL (ref 4.0–10.5)
WBC: 11.7 10*3/uL — ABNORMAL HIGH (ref 4.0–10.5)
nRBC: 0.2 % (ref 0.0–0.2)
nRBC: 0 % (ref 0.0–0.2)

## 2019-10-10 LAB — PROTIME-INR
INR: 1.1 (ref 0.8–1.2)
Prothrombin Time: 13.5 seconds (ref 11.4–15.2)

## 2019-10-10 LAB — FIBRINOGEN: Fibrinogen: 344 mg/dL (ref 210–475)

## 2019-10-10 LAB — SAVE SMEAR(SSMR), FOR PROVIDER SLIDE REVIEW

## 2019-10-10 LAB — APTT: aPTT: 33 seconds (ref 24–36)

## 2019-10-10 LAB — RPR: RPR Ser Ql: NONREACTIVE

## 2019-10-10 MED ORDER — MISOPROSTOL 200 MCG PO TABS
ORAL_TABLET | ORAL | Status: AC
  Filled 2019-10-10: qty 4

## 2019-10-10 MED ORDER — COCONUT OIL OIL: 1.0000 "application " | TOPICAL_OIL | Status: DC | PRN

## 2019-10-10 MED ORDER — LACTATED RINGERS IV SOLN
500.0000 mL | Freq: Once | INTRAVENOUS | Status: AC
  Administered 2019-10-10: 500 mL via INTRAVENOUS

## 2019-10-10 MED ORDER — DIBUCAINE (PERIANAL) 1 % EX OINT: 1.0000 "application " | TOPICAL_OINTMENT | CUTANEOUS | Status: DC | PRN

## 2019-10-10 MED ORDER — ACETAMINOPHEN 325 MG PO TABS: 650.0000 mg | ORAL_TABLET | ORAL | Status: DC | PRN

## 2019-10-10 MED ORDER — FENTANYL CITRATE (PF) 2500 MCG/50ML IJ SOLN
INTRAMUSCULAR | Status: DC | PRN
Start: 2019-10-10 — End: 2019-10-10
  Administered 2019-10-10: 12 mL/h via EPIDURAL

## 2019-10-10 MED ORDER — PRENATAL MULTIVITAMIN CH
1.0000 | ORAL_TABLET | Freq: Every day | ORAL | Status: DC
  Administered 2019-10-11: 1 via ORAL
  Filled 2019-10-10: qty 1

## 2019-10-10 MED ORDER — EPHEDRINE 5 MG/ML INJ: 10.0000 mg | INTRAVENOUS | Status: DC | PRN

## 2019-10-10 MED ORDER — FENTANYL CITRATE (PF) 100 MCG/2ML IJ SOLN
INTRAMUSCULAR | Status: DC | PRN
Start: 2019-10-10 — End: 2019-10-10
  Administered 2019-10-10: 100 ug via EPIDURAL

## 2019-10-10 MED ORDER — TRANEXAMIC ACID-NACL 1000-0.7 MG/100ML-% IV SOLN
INTRAVENOUS | Status: AC
  Administered 2019-10-10: 1000 mg
  Filled 2019-10-10: qty 100

## 2019-10-10 MED ORDER — MISOPROSTOL 200 MCG PO TABS
800.0000 ug | ORAL_TABLET | Freq: Once | ORAL | Status: AC
  Administered 2019-10-10: 800 ug via RECTAL

## 2019-10-10 MED ORDER — TRANEXAMIC ACID-NACL 1000-0.7 MG/100ML-% IV SOLN: 1000.0000 mg | INTRAVENOUS | Status: DC

## 2019-10-10 MED ORDER — SENNOSIDES-DOCUSATE SODIUM 8.6-50 MG PO TABS
2.0000 | ORAL_TABLET | ORAL | Status: DC
  Administered 2019-10-11 (×2): 2 via ORAL
  Filled 2019-10-10 (×2): qty 2

## 2019-10-10 MED ORDER — CARBOPROST TROMETHAMINE 250 MCG/ML IM SOLN
INTRAMUSCULAR | Status: AC
  Administered 2019-10-10: 250 ug
  Filled 2019-10-10: qty 1

## 2019-10-10 MED ORDER — FENTANYL-BUPIVACAINE-NACL 0.5-0.125-0.9 MG/250ML-% EP SOLN
12.0000 mL/h | EPIDURAL | Status: DC | PRN
  Filled 2019-10-10: qty 250

## 2019-10-10 MED ORDER — ZOLPIDEM TARTRATE 5 MG PO TABS: 5.0000 mg | ORAL_TABLET | Freq: Every evening | ORAL | Status: DC | PRN

## 2019-10-10 MED ORDER — MISOPROSTOL 200 MCG PO TABS
ORAL_TABLET | ORAL | Status: AC
  Filled 2019-10-10: qty 1

## 2019-10-10 MED ORDER — DIPHENOXYLATE-ATROPINE 2.5-0.025 MG PO TABS
2.0000 | ORAL_TABLET | Freq: Once | ORAL | Status: AC
  Administered 2019-10-10: 2 via ORAL
  Filled 2019-10-10: qty 2

## 2019-10-10 MED ORDER — LIDOCAINE-EPINEPHRINE (PF) 2 %-1:200000 IJ SOLN
INTRAMUSCULAR | Status: DC | PRN
  Administered 2019-10-10: 10 mL via EPIDURAL

## 2019-10-10 MED ORDER — DIPHENHYDRAMINE HCL 25 MG PO CAPS: 25.0000 mg | ORAL_CAPSULE | Freq: Four times a day (QID) | ORAL | Status: DC | PRN

## 2019-10-10 MED ORDER — ONDANSETRON HCL 4 MG/2ML IJ SOLN: 4.0000 mg | INTRAMUSCULAR | Status: DC | PRN

## 2019-10-10 MED ORDER — ONDANSETRON HCL 4 MG PO TABS: 4.0000 mg | ORAL_TABLET | ORAL | Status: DC | PRN

## 2019-10-10 MED ORDER — LACTATED RINGERS IV SOLN: INTRAVENOUS | Status: DC

## 2019-10-10 MED ORDER — DIPHENHYDRAMINE HCL 50 MG/ML IJ SOLN: 12.5000 mg | INTRAMUSCULAR | Status: DC | PRN

## 2019-10-10 MED ORDER — MISOPROSTOL 200 MCG PO TABS: 200.0000 ug | ORAL_TABLET | Freq: Once | ORAL | Status: DC

## 2019-10-10 MED ORDER — METHYLERGONOVINE MALEATE 0.2 MG/ML IJ SOLN
INTRAMUSCULAR | Status: AC
  Administered 2019-10-10: 0.2 mg
  Filled 2019-10-10: qty 1

## 2019-10-10 MED ORDER — PHENYLEPHRINE 40 MCG/ML (10ML) SYRINGE FOR IV PUSH (FOR BLOOD PRESSURE SUPPORT): 80.0000 ug | PREFILLED_SYRINGE | INTRAVENOUS | Status: DC | PRN

## 2019-10-10 MED ORDER — DOCUSATE SODIUM 100 MG PO CAPS
100.0000 mg | ORAL_CAPSULE | Freq: Two times a day (BID) | ORAL | Status: DC
  Administered 2019-10-10 – 2019-10-12 (×4): 100 mg via ORAL
  Filled 2019-10-10 (×4): qty 1

## 2019-10-10 MED ORDER — IBUPROFEN 600 MG PO TABS
600.0000 mg | ORAL_TABLET | Freq: Four times a day (QID) | ORAL | Status: DC
  Administered 2019-10-10 – 2019-10-12 (×6): 600 mg via ORAL
  Filled 2019-10-10 (×7): qty 1

## 2019-10-10 MED ORDER — CARBOPROST TROMETHAMINE 250 MCG/ML IM SOLN: 250.0000 ug | Freq: Once | INTRAMUSCULAR | Status: DC

## 2019-10-10 MED ORDER — TETANUS-DIPHTH-ACELL PERTUSSIS 5-2.5-18.5 LF-MCG/0.5 IM SUSP: 0.5000 mL | Freq: Once | INTRAMUSCULAR | Status: DC

## 2019-10-10 MED ORDER — MEASLES, MUMPS & RUBELLA VAC IJ SOLR
0.5000 mL | Freq: Once | INTRAMUSCULAR | Status: AC
  Administered 2019-10-12: 0.5 mL via SUBCUTANEOUS
  Filled 2019-10-10: qty 0.5

## 2019-10-10 MED ORDER — WITCH HAZEL-GLYCERIN EX PADS: 1.0000 "application " | MEDICATED_PAD | CUTANEOUS | Status: DC | PRN

## 2019-10-10 MED ORDER — LIDOCAINE HCL (PF) 1 % IJ SOLN
INTRAMUSCULAR | Status: DC | PRN
  Administered 2019-10-10: 8 mL via EPIDURAL

## 2019-10-10 MED ORDER — METHYLERGONOVINE MALEATE 0.2 MG/ML IJ SOLN: 0.2000 mg | Freq: Once | INTRAMUSCULAR | Status: DC

## 2019-10-10 MED ORDER — EPHEDRINE 5 MG/ML INJ
10.0000 mg | INTRAVENOUS | Status: DC | PRN
  Administered 2019-10-10: 10 mg via INTRAVENOUS
  Filled 2019-10-10: qty 10

## 2019-10-10 MED ORDER — SIMETHICONE 80 MG PO CHEW: 80.0000 mg | CHEWABLE_TABLET | ORAL | Status: DC | PRN

## 2019-10-10 MED ORDER — BENZOCAINE-MENTHOL 20-0.5 % EX AERO: 1.0000 "application " | INHALATION_SPRAY | CUTANEOUS | Status: DC | PRN

## 2019-10-10 NOTE — Anesthesia Preprocedure Evaluation (Signed)
Anesthesia Evaluation  Patient identified by MRN, date of birth, ID band  Reviewed: Allergy & Precautions, NPO status , Patient's Chart, lab work & pertinent test results  Airway Mallampati: III  TM Distance: >3 FB Neck ROM: Full    Dental no notable dental hx.    Pulmonary neg pulmonary ROS,    Pulmonary exam normal breath sounds clear to auscultation       Cardiovascular hypertension (preeclampsia on Mag), Pt. on medications Normal cardiovascular exam Rhythm:Regular Rate:Normal     Neuro/Psych PSYCHIATRIC DISORDERS Anxiety Depression negative neurological ROS     GI/Hepatic negative GI ROS, Neg liver ROS,   Endo/Other  Morbid obesity  Renal/GU negative Renal ROS  negative genitourinary   Musculoskeletal negative musculoskeletal ROS (+)   Abdominal (+) + obese,   Peds  Hematology  (+) anemia ,   Anesthesia Other Findings   Reproductive/Obstetrics (+) Pregnancy                             Anesthesia Physical Anesthesia Plan  ASA: III  Anesthesia Plan: Epidural   Post-op Pain Management:    Induction:   PONV Risk Score and Plan:   Airway Management Planned:   Additional Equipment:   Intra-op Plan:   Post-operative Plan:   Informed Consent: I have reviewed the patients History and Physical, chart, labs and discussed the procedure including the risks, benefits and alternatives for the proposed anesthesia with the patient or authorized representative who has indicated his/her understanding and acceptance.       Plan Discussed with:   Anesthesia Plan Comments:         Anesthesia Quick Evaluation

## 2019-10-10 NOTE — Progress Notes (Signed)
Patient ID: Synetta Fail, female   DOB: [birth year], 29 y.o.   MRN: [id number] Doing better with more regular contractions  Vitals:   10/10/19 0300 10/10/19 0330 10/10/19 0407 10/10/19 0430  BP: 140/88 (!) 142/81 (!) 152/92 (!) 132/92  Pulse: 98 (!) 115 (!) 106 97  Resp: 18 19 19 18   Temp:    98.5 F (36.9 C)  TempSrc:    Oral  SpO2:      Weight:      Height:       FHR reassuring UCs every 2 min  Dilation: 4 Effacement (%): 80 Station: -1 Presentation: Vertex Exam by:: [email or web address] RN

## 2019-10-10 NOTE — Progress Notes (Signed)
Patient ID: Julia Potter, female   DOB: 1990/09/12, 29 y.o.   MRN: [id number] Doing well but UCs have spaced out Pitocin is at 75mu/min  Vitals:   10/10/19 0131 10/10/19 0200 10/10/19 0230 10/10/19 0300  BP: 137/87 (!) 144/89 140/83 140/88  Pulse: (!) 101 96 99 98  Resp:  17 18 18   Temp:      TempSrc:      SpO2:      Weight:      Height:       FHR reactive UCs spaced out  AROM clear fluid IUPC inserted

## 2019-10-10 NOTE — Progress Notes (Signed)
SIGNIFICANT EVENT  Called to bedside due to large clots expressed on fundal massage by RN.   VSS with patient, patient talking and responding appropriately.  Evaluated patient, with manual evacuation of moderate amounts of clots and blood. Fundus firm and 2+ below umbilicus. Patient had already received of cytotec PR after delivery and 1g TXA after delivery as well.   Initiated IM 0.25mg  Hemabate and buccally of cytotec. Foley catheter placed.  Repeated bimanual exam with minimal clots and minimal bleeding. Fundus firm, 2+ below umbilicus.   Lomotil also given to patient due to cramping and likely diarrhea from hemabate being given. BP is mildly elevated, patient already has preeclampsia and is on magnesium.   EBL at delivery: 300cc EBL after PPH (including delivery EBL): 1191cc  Vitals:   10/10/19 1050 10/10/19 1054  BP: 136/71 (!) 142/82  Pulse: (!) 112 (!) 117  Resp:    Temp:    SpO2: 97%     Methergine series started.   Dr. Alysia Penna is aware and available if continues to have bleeding.    Jen Mow, DO OB [facility] for [employer], [facility] 10/10/2019  11:04 AM

## 2019-10-10 NOTE — Anesthesia Procedure Notes (Signed)
Epidural Patient location during procedure: OB Start time: 10/10/2019 5:49 AM End time: 10/10/2019 5:55 AM  Staffing Anesthesiologist: Mellody Dance, MD Performed: anesthesiologist   Preanesthetic Checklist Completed: patient identified, IV checked, site marked, risks and benefits discussed, monitors and equipment checked, pre-op evaluation and timeout performed  Epidural Patient position: sitting Prep: DuraPrep Patient monitoring: heart rate, cardiac monitor, continuous pulse ox and blood pressure Approach: midline Location: L2-L3 Injection technique: LOR saline  Needle:  Needle type: Tuohy  Needle gauge: 17 G Needle length: 9 cm Needle insertion depth: 8 cm Catheter size: 20 Guage Catheter at skin depth: 14 cm Test dose: negative and Other  Assessment Events: blood not aspirated, injection not painful, no injection resistance and negative IV test  Additional Notes Informed consent obtained prior to proceeding including risk of failure, 1% risk of PDPH, risk of minor discomfort and bruising.  Discussed rare but serious complications including epidural abscess, permanent nerve injury, epidural hematoma.  Discussed alternatives to epidural analgesia and patient desires to proceed.  Timeout performed pre-procedure verifying patient name, procedure, and platelet count.  Patient tolerated procedure well.

## 2019-10-10 NOTE — Discharge Summary (Signed)
Postpartum Discharge Summary     Patient Name: Julia Potter DOB: 15-Nov-1990 MRN: [id number]  Date of admission: 10/09/2019 Delivery date:10/10/2019  Delivering provider: Isaias Sakai  Date of discharge: 10/12/2019  Admitting diagnosis: Chronic hypertension with superimposed preeclampsia [O11.9] Intrauterine pregnancy: [redacted]w[redacted]d    Secondary diagnosis:  Principal Problem:   Severe preeclampsia, delivered Active Problems:   Preterm delivery, delivered   Chronic hypertension in pregnancy   Chronic hypertension with superimposed severe preeclampsia   Delay postpartum hemorrhage   Anemia, postpartum   Anemia due to acute blood loss  Additional problems: Postpartum hemorrhage   Discharge diagnosis: Preterm Pregnancy Delivered, Preeclampsia (severe) and CHTN with superimposed preeclampsia; Anemia due to postpartum hemorrhage                                              Post partum procedures:rhogam Augmentation: AROM and Pitocin Complications: None  Hospital course: Induction of Labor With Vaginal Delivery   29y.o. yo [phone] [id number] admitted to the hospital 10/09/2019 for induction of labor.  Indication for induction: CHTN with superimposed preeclampsia with severe features.  Patient had an uncomplicated labor course as follows: Membrane Rupture Time/Date: 3:07 AM ,10/10/2019   Delivery Method:Vaginal, Vacuum (Extractor)  Episiotomy: None  Lacerations:  None  Details of delivery can be found in separate delivery note. Patient encountered a delayed postpartum hemorrhage, with EBL [id number] with uterotonics.  Hemoglobin 8.1 from 11.4, asymptomatic and started on oral iron therapy.  She received magnesium sulfate intrapartum and postpartum for eclampsia prophylaxis, was started on nifedipine XR 30 mg daily for BP control.   Patient otherwise had a routine postpartum course. Patient is discharged home 10/12/19.  Newborn Data: Birth date:10/10/2019  Birth time:9:08 AM   Gender:Female  Living status:Living  Apgars:9 ,9  Weight:2534 g   Magnesium Sulfate received: Yes: Seizure prophylaxis BMZ received: Yes Rhophylac:Yes MMR:Yes T-DaP:Given prenatally Flu: N/A  COVID: Declined Transfusion:No  Physical exam  Vitals:   10/11/19 1925 10/11/19 2332 10/12/19 0319 10/12/19 0822  BP: (!) 146/87 133/75 (!) 144/90 (!) 142/94  Pulse: (!) 103 97 (!) 101 (!) 107  Resp: _0 Temp: 98.1 F (36.7 C) 98 F (36.7 C) 98.1 F (36.7 C) 97.9 F (36.6 C)  TempSrc: Oral Oral Oral Oral  SpO2: 100% 99% 98% 100%  Weight:      Height:       General: alert, cooperative and no distress Lochia: appropriate Uterine Fundus: firm, NT Incision: N/A DVT Evaluation: No evidence of DVT seen on physical exam. Negative Homan's sign. No cords or calf tenderness. Labs: Lab Results  Component Value Date   WBC 11.1 (H) 10/11/2019   HGB 8.1 (L) 10/11/2019   HCT 24.7 (L) 10/11/2019   MCV 92.5 10/11/2019   PLT 240 10/11/2019   CMP Latest Ref Rng & Units 10/11/2019  Glucose 70 - 99 mg/dL 81  BUN 6 - 20 mg/dL <5(L)  Creatinine 0.44 - 1.00 mg/dL 0.54  Sodium 135 - 145 mmol/L 137  Potassium 3.5 - 5.1 mmol/L 3.6  Chloride 98 - 111 mmol/L 107  CO2 22 - 32 mmol/L 21(L)  Calcium 8.9 - 10.3 mg/dL 7.5(L)  Total Protein 6.5 - 8.1 g/dL 4.9(L)  Total Bilirubin 0.3 - 1.2 mg/dL 0.4  Alkaline Phos 38 - 126 U/L 81  AST 15 - 41 U/L  17  ALT 0 - 44 U/L 12   Edinburgh Score: Edinburgh Postnatal Depression Scale Screening Tool 10/10/2019  I have been able to laugh and see the funny side of things. 0  I have looked forward with enjoyment to things. 0  I have blamed myself unnecessarily when things went wrong. 0  I have been anxious or worried for no good reason. 1  I have felt scared or panicky for no good reason. 0  Things have been getting on top of me. 0  I have been so unhappy that I have had difficulty sleeping. 0  I have felt sad or miserable. 0  I have been so unhappy  that I have been crying. 0  The thought of harming myself has occurred to me. 0  Edinburgh Postnatal Depression Scale Total 1     After visit meds:  Allergies as of 10/12/2019   No Known Allergies     Medication List    STOP taking these medications   valACYclovir 1000 MG tablet Commonly known as: Valtrex     TAKE these medications   aspirin EC 81 MG tablet Take 1 tablet (81 mg total) by mouth daily. Take after 12 weeks for prevention of preeclampsia later in pregnancy   docusate sodium 100 MG capsule Commonly known as: COLACE Take 1 capsule (100 mg total) by mouth 2 (two) times daily as needed for mild constipation or moderate constipation.   ferrous sulfate 325 (65 FE) MG tablet Take 1 tablet (325 mg total) by mouth 2 (two) times daily with a meal.   ibuprofen 600 MG tablet Commonly known as: ADVIL Take 1 tablet (600 mg total) by mouth every 6 (six) hours.   NIFEdipine 30 MG 24 hr tablet Commonly known as: ADALAT CC Take 1 tablet (30 mg total) by mouth daily.        Discharge home in stable condition Infant Feeding: Breast Infant Disposition:home with mother Discharge instruction: per After Visit Summary and Postpartum booklet. Activity: Advance as tolerated. Pelvic rest for 6 weeks.  Diet: routine diet  Future Appointments  Date Time Provider [place]  10/17/2019  2:00 PM [facility] NURSE [facility]  11/12/2019 10:15 AM Tresea Mall, CNM [facility]   Please schedule this patient for a In person postpartum visit in 6 weeks with the following provider: Any provider. Additional Postpartum F/U:BP check 1 week  High risk pregnancy complicated by: HTN and superimposed preeclampsia with severe features Delivery mode:  Vaginal, Vacuum Neurosurgeon)  Anticipated Birth Control:  Unsure at discharge   10/12/2019 Verita Schneiders, MD

## 2019-10-11 LAB — COMPREHENSIVE METABOLIC PANEL
ALT: 12 U/L (ref 0–44)
AST: 17 U/L (ref 15–41)
Albumin: 2 g/dL — ABNORMAL LOW (ref 3.5–5.0)
Alkaline Phosphatase: 81 U/L (ref 38–126)
Anion gap: 9 (ref 5–15)
BUN: <5 mg/dL — ABNORMAL LOW (ref 6–20)
CO2: 21 mmol/L — ABNORMAL LOW (ref 22–32)
Calcium: 7.5 mg/dL — ABNORMAL LOW (ref 8.9–10.3)
Chloride: 107 mmol/L (ref 98–111)
Creatinine, Ser: 0.54 mg/dL (ref 0.44–1.00)
GFR calc Af Amer: >60 mL/min (ref >60)
GFR calc non Af Amer: >60 mL/min (ref >60)
Glucose, Bld: 81 mg/dL (ref 70–99)
Potassium: 3.6 mmol/L (ref 3.5–5.1)
Sodium: 137 mmol/L (ref 135–145)
Total Bilirubin: 0.4 mg/dL (ref 0.3–1.2)
Total Protein: 4.9 g/dL — ABNORMAL LOW (ref 6.5–8.1)

## 2019-10-11 LAB — CBC
HCT: 24.7 % — ABNORMAL LOW (ref 36.0–46.0)
Hemoglobin: 8.1 g/dL — ABNORMAL LOW (ref 12.0–15.0)
MCH: 30.3 pg (ref 26.0–34.0)
MCHC: 32.8 g/dL (ref 30.0–36.0)
MCV: 92.5 fL (ref 80.0–100.0)
Platelets: 240 10*3/uL (ref 150–400)
RBC: 2.67 MIL/uL — ABNORMAL LOW (ref 3.87–5.11)
RDW: 14.1 % (ref 11.5–15.5)
WBC: 11.1 10*3/uL — ABNORMAL HIGH (ref 4.0–10.5)
nRBC: 0 % (ref 0.0–0.2)

## 2019-10-11 MED ORDER — METHYLERGONOVINE MALEATE 0.2 MG PO TABS: 0.2000 mg | ORAL_TABLET | ORAL | Status: DC | PRN

## 2019-10-11 MED ORDER — METHYLERGONOVINE MALEATE 0.2 MG/ML IJ SOLN: 0.2000 mg | INTRAMUSCULAR | Status: DC | PRN

## 2019-10-11 MED ORDER — MAGNESIUM SULFATE 40 GM/1000ML IV SOLN
2.0000 g/h | INTRAVENOUS | Status: AC
  Administered 2019-10-11: 2 g/h via INTRAVENOUS
  Filled 2019-10-11: qty 1000

## 2019-10-11 MED ORDER — RHO D IMMUNE GLOBULIN 1500 UNIT/2ML IJ SOSY
300.0000 ug | PREFILLED_SYRINGE | Freq: Once | INTRAMUSCULAR | Status: AC
  Administered 2019-10-11: 300 ug via INTRAVENOUS
  Filled 2019-10-11: qty 2

## 2019-10-11 MED ORDER — FERROUS SULFATE 325 (65 FE) MG PO TABS
325.0000 mg | ORAL_TABLET | Freq: Three times a day (TID) | ORAL | Status: DC
  Administered 2019-10-11 – 2019-10-12 (×4): 325 mg via ORAL
  Filled 2019-10-11 (×4): qty 1

## 2019-10-11 NOTE — Lactation Note (Signed)
This note was copied from a baby's chart. Lactation Consultation Note Baby 17 hrs old. Mom BF baby for 20 minutes.  Mom has long pendulous breast w/everted short shaft nipples at the bottom end of breast. Very compressible.  Hand expression w/colostrum noted. Rt. Breast larger than Lt. Encouraged mom to wear bra in am.  LPI newborn feeding habits, STS, I&O, supplementing, breast massage, milk storage, supply and demand discussed. LPI information sheet given and reviewed. Reviewed pumping and the importance of it.  Reviewed cleaning pump parts. LC cleaned them.  Hand expressed colostrum gave to baby as supplement. Mom had 2 ml in ref. LC expressed 5 ml. Stressed importance of supplementing.  Encouraged mom to call for assistance or questions. Lactation brochure given.  Patient Name: Julia Potter [id number] Date: 10/11/2019 Reason for consult: Initial assessment;Late-preterm 34-36.6wks;Infant < 6lbs;1st time breastfeeding   Maternal Data Has patient been taught Hand Expression?: Yes Does the patient have breastfeeding experience prior to this delivery?: Yes  Feeding Feeding Type: Breast Milk Nipple Type: Slow - flow  LATCH Score       Type of Nipple: Everted at rest and after stimulation  Comfort (Breast/Nipple): Soft / non-tender        Interventions Interventions: Breast feeding basics reviewed;Breast compression;Skin to skin;Breast massage;DEBP;Position options;Hand express;Pre-pump if needed;Expressed milk  Lactation Tools Discussed/Used Tools: Pump Breast pump type: Double-Electric Breast Pump WIC Program: Yes Pump Review: Setup, frequency, and cleaning;Milk Storage Initiated by:: RN Date initiated:: 10/10/19   Consult Status Consult Status: Follow-up Date: 10/11/19 Follow-up type: In-patient    Charyl Dancer 10/11/2019, 2:38 AM

## 2019-10-11 NOTE — Anesthesia Postprocedure Evaluation (Signed)
Anesthesia Post Note  Patient: Julia Potter  Procedure(s) Performed: AN AD HOC LABOR EPIDURAL     Patient location during evaluation: Mother Baby Anesthesia Type: Epidural Level of consciousness: awake and alert and oriented Pain management: satisfactory to patient Vital Signs Assessment: post-procedure vital signs reviewed and stable Respiratory status: respiratory function stable Cardiovascular status: stable Postop Assessment: no headache, no backache, epidural receding, patient able to bend at knees, no signs of nausea or vomiting, adequate PO intake and able to ambulate Anesthetic complications: no   No complications documented.  Last Vitals:  Vitals:   10/11/19 0319 10/11/19 0754  BP: 130/67 140/73  Pulse: 97 97  Resp: 20   Temp: 37.1 C   SpO2: 96%     Last Pain:  Vitals:   10/11/19 0754  TempSrc:   PainSc: 2    Pain Goal:                Epidural/Spinal Function Cutaneous sensation: Normal sensation (10/11/19 0754), Patient able to flex knees: Yes (10/11/19 0754), Patient able to lift hips off bed: Yes (10/11/19 0754), Back pain beyond tenderness at insertion site: No (10/11/19 0754), Progressively worsening motor and/or sensory loss: No (10/11/19 0754), Bowel and/or bladder incontinence post epidural: No (10/11/19 0754)  Karleen Dolphin

## 2019-10-11 NOTE — Lactation Note (Signed)
This note was copied from a baby's chart. Lactation Consultation Note  Patient Name: Julia Potter [id number] Date: 10/11/2019 Reason for consult: Follow-up assessment;Infant < 6lbs;Late-preterm 34-36.6wks CHTN MgSO4  LC in to visit with P2 Mom of LPTI at 80 hrs old. Baby at 3.6% weight loss today. 5 voids and 4 stools first 24 hrs.   Baby positioned in cross cradle hold.  Offered assistance as baby was latched onto nipple.  Reviewed importance of depth on the latch.  Baby able to open and latch deeper.  Mom provided with pillow support under baby and under her supporting arm.  Taught Mom to use gentle alternate breast compression to increase milk transfer.  Baby rhythmic with her sucks and swallowing identified.  Mom feeling uterine cramping.    Mom has a pump set up at bedside and states she has pumped.  Talked about importance of regular pumping due to baby's prematurity. Mom states she has a DEBP at home.   Plan- 1- Keep baby STS as much as possible 2- Offer breast with cues, goal of 8-12 feedings per 24 hrs. 3- Supplement per LPTI guidelines using EBM+/formula. 4- Pump both breasts 15 mins on initiation setting.  Praised Mom and FOB for their commitment to the breast.  Mom to ask for help prn.  Mom aware of OP lactation support and states she desires referral to OP lactation consultant on discharge.  Feeding Feeding Type: Breast Fed  LATCH Score Latch: Grasps breast easily, tongue down, lips flanged, rhythmical sucking.  Audible Swallowing: Spontaneous and intermittent  Type of Nipple: Everted at rest and after stimulation  Comfort (Breast/Nipple): Soft / non-tender  Hold (Positioning): Assistance needed to correctly position infant at breast and maintain latch.  LATCH Score: 9  Interventions Interventions: Breast feeding basics reviewed;Assisted with latch;Skin to skin;Breast massage;Hand express;Breast compression;Adjust position;DEBP;Support pillows;Position  options  Lactation Tools Discussed/Used Tools: Pump;Bottle Breast pump type: Double-Electric Breast Pump   Consult Status Consult Status: Follow-up Date: 10/12/19 Follow-up type: In-patient    Julia Potter 10/11/2019, 9:42 AM

## 2019-10-11 NOTE — Progress Notes (Signed)
Post Partum Day 1 Subjective: Patient is doing well and is without any complaints. She denies HA, visual changes, RUQ/epigastric pain, nausea or emesis. She denies chest pain, SOB, lightheadedness/dizziness  Objective: Blood pressure 140/73, pulse 97, temperature 98.7 F (37.1 C), temperature source Oral, resp. rate 20, height 5\' 2"  (1.575 m), weight 107.5 kg, last menstrual period 01/31/2019, SpO2 96 %, unknown if currently breastfeeding.  Physical Exam:  General: alert, cooperative and no distress Lochia: appropriate Uterine Fundus: firm DVT Evaluation: No evidence of DVT seen on physical exam.  Recent Labs    10/10/19 1034 10/11/19 0649  HGB 11.4* 8.1*  HCT 34.1* 24.7*    Assessment/Plan: Patient doing well PPD#1 with preeclampsia - Continue magnesium sulfate for seizure prophylaxis - Patient asymptomatic with anemia. Will start oral iron supplement - Continue routine postpartum care    LOS: 2 days   Julia Potter 10/11/2019, 7:56 AM

## 2019-10-11 NOTE — Progress Notes (Signed)
MOB was referred for history of depression/anxiety. * Referral screened out by Clinical Social Worker because none of the following criteria appear to apply: ~ History of anxiety/depression during this pregnancy, or of post-partum depression. ~ Diagnosis of anxiety and/or depression within last 3 years OR * MOB's symptoms currently being treated with medication and/or therapy. Patient has an established relationship with therapist Jamie with [facility].   Please contact the Clinical Social Worker if needs arise, or if MOB requests.  Sharicka Pogorzelski Boyd-Gilyard, MSW, LCSW Clinical Social Work [phone] 

## 2019-10-12 DIAGNOSIS — O1414 Severe pre-eclampsia complicating childbirth: Secondary | ICD-10-CM | POA: Diagnosis present

## 2019-10-12 DIAGNOSIS — Z8759 Personal history of other complications of pregnancy, childbirth and the puerperium: Secondary | ICD-10-CM | POA: Diagnosis present

## 2019-10-12 DIAGNOSIS — D62 Acute posthemorrhagic anemia: Secondary | ICD-10-CM

## 2019-10-12 DIAGNOSIS — O9081 Anemia of the puerperium: Secondary | ICD-10-CM | POA: Diagnosis not present

## 2019-10-12 HISTORY — DX: Acute posthemorrhagic anemia: D62

## 2019-10-12 HISTORY — DX: Anemia of the puerperium: O90.81

## 2019-10-12 LAB — RH IG WORKUP (INCLUDES ABO/RH)
ABO/RH(D): A NEG
Fetal Screen: NEGATIVE
Gestational Age(Wks): 36
Unit division: 0

## 2019-10-12 MED ORDER — DOCUSATE SODIUM 100 MG PO CAPS: 100.0000 mg | ORAL_CAPSULE | Freq: Two times a day (BID) | ORAL | 2 refills | Status: DC | PRN

## 2019-10-12 MED ORDER — FERROUS SULFATE 325 (65 FE) MG PO TABS
325.0000 mg | ORAL_TABLET | Freq: Two times a day (BID) | ORAL | 3 refills | Status: DC
Start: 2019-10-12 — End: 2022-08-08

## 2019-10-12 MED ORDER — FERROUS SULFATE 325 (65 FE) MG PO TABS: 325.0000 mg | ORAL_TABLET | Freq: Two times a day (BID) | ORAL | Status: DC

## 2019-10-12 MED ORDER — NIFEDIPINE ER OSMOTIC RELEASE 30 MG PO TB24
30.0000 mg | ORAL_TABLET | Freq: Every day | ORAL | Status: DC
  Administered 2019-10-12: 30 mg via ORAL
  Filled 2019-10-12: qty 1

## 2019-10-12 MED ORDER — IBUPROFEN 600 MG PO TABS
600.0000 mg | ORAL_TABLET | Freq: Four times a day (QID) | ORAL | 0 refills | Status: DC
Start: 2019-10-12 — End: 2022-10-04

## 2019-10-12 MED ORDER — NIFEDIPINE ER 30 MG PO TB24: 30.0000 mg | ORAL_TABLET | Freq: Every day | ORAL | 2 refills | Status: DC

## 2019-10-12 NOTE — Discharge Instructions (Signed)
Postpartum Care After Vaginal Delivery This sheet gives you information about how to care for yourself from the time you deliver your baby to up to 6-12 weeks after delivery (postpartum period). Your health care provider may also give you more specific instructions. If you have problems or questions, contact your health care provider. Follow these instructions at home: Vaginal bleeding  It is normal to have vaginal bleeding (lochia) after delivery. Wear a sanitary pad for vaginal bleeding and discharge. ? During the first week after delivery, the amount and appearance of lochia is often similar to a menstrual period. ? Over the next few weeks, it will gradually decrease to a dry, yellow-brown discharge. ? For most women, lochia stops completely by 4-6 weeks after delivery. Vaginal bleeding can vary from woman to woman.  Change your sanitary pads frequently. Watch for any changes in your flow, such as: ? A sudden increase in volume. ? A change in color. ? Large blood clots.  If you pass a blood clot from your vagina, save it and call your health care provider to discuss. Do not flush blood clots down the toilet before talking with your health care provider.  Do not use tampons or douches until your health care provider says this is safe.  If you are not breastfeeding, your period should return 6-8 weeks after delivery. If you are feeding your child breast milk only (exclusive breastfeeding), your period may not return until you stop breastfeeding. Perineal care  Keep the area between the vagina and the anus (perineum) clean and dry as told by your health care provider. Use medicated pads and pain-relieving sprays and creams as directed.  If you had a cut in the perineum (episiotomy) or a tear in the vagina, check the area for signs of infection until you are healed. Check for: ? More redness, swelling, or pain. ? Fluid or blood coming from the cut or tear. ? Warmth. ? Pus or a bad  smell.  You may be given a squirt bottle to use instead of wiping to clean the perineum area after you go to the bathroom. As you start healing, you may use the squirt bottle before wiping yourself. Make sure to wipe gently.  To relieve pain caused by an episiotomy, a tear in the vagina, or swollen veins in the anus (hemorrhoids), try taking a warm sitz bath 2-3 times a day. A sitz bath is a warm water bath that is taken while you are sitting down. The water should only come up to your hips and should cover your buttocks. Breast care  Within the first few days after delivery, your breasts may feel heavy, full, and uncomfortable (breast engorgement). Milk may also leak from your breasts. Your health care provider can suggest ways to help relieve the discomfort. Breast engorgement should go away within a few days.  If you are breastfeeding: ? Wear a bra that supports your breasts and fits you well. ? Keep your nipples clean and dry. Apply creams and ointments as told by your health care provider. ? You may need to use breast pads to absorb milk that leaks from your breasts. ? You may have uterine contractions every time you breastfeed for up to several weeks after delivery. Uterine contractions help your uterus return to its normal size. ? If you have any problems with breastfeeding, work with your health care provider or lactation consultant.  If you are not breastfeeding: ? Avoid touching your breasts a lot. Doing this can make   your breasts produce more milk. ? Wear a good-fitting bra and use cold packs to help with swelling. ? Do not squeeze out (express) milk. This causes you to make more milk. Intimacy and sexuality  Ask your health care provider when you can engage in sexual activity. This may depend on: ? Your risk of infection. ? How fast you are healing. ? Your comfort and desire to engage in sexual activity.  You are able to get pregnant after delivery, even if you have not had  your period. If desired, talk with your health care provider about methods of birth control (contraception). Medicines  Take over-the-counter and prescription medicines only as told by your health care provider.  If you were prescribed an antibiotic medicine, take it as told by your health care provider. Do not stop taking the antibiotic even if you start to feel better. Activity  Gradually return to your normal activities as told by your health care provider. Ask your health care provider what activities are safe for you.  Rest as much as possible. Try to rest or take a nap while your baby is sleeping. Eating and drinking   Drink enough fluid to keep your urine pale yellow.  Eat high-fiber foods every day. These may help prevent or relieve constipation. High-fiber foods include: ? Whole grain cereals and breads. ? Brown rice. ? Beans. ? Fresh fruits and vegetables.  Do not try to lose weight quickly by cutting back on calories.  Take your prenatal vitamins until your postpartum checkup or until your health care provider tells you it is okay to stop. Lifestyle  Do not use any products that contain nicotine or tobacco, such as cigarettes and e-cigarettes. If you need help quitting, ask your health care provider.  Do not drink alcohol, especially if you are breastfeeding. General instructions  Keep all follow-up visits for you and your baby as told by your health care provider. Most women visit their health care provider for a postpartum checkup within the first 3-6 weeks after delivery. Contact a health care provider if:  You feel unable to cope with the changes that your child brings to your life, and these feelings do not go away.  You feel unusually sad or worried.  Your breasts become red, painful, or hard.  You have a fever.  You have trouble holding urine or keeping urine from leaking.  You have little or no interest in activities you used to enjoy.  You have not  breastfed at all and you have not had a menstrual period for 12 weeks after delivery.  You have stopped breastfeeding and you have not had a menstrual period for 12 weeks after you stopped breastfeeding.  You have questions about caring for yourself or your baby.  You pass a blood clot from your vagina. Get help right away if:  You have chest pain.  You have difficulty breathing.  You have sudden, severe leg pain.  You have severe pain or cramping in your lower abdomen.  You bleed from your vagina so much that you fill more than one sanitary pad in one hour. Bleeding should not be heavier than your heaviest period.  You develop a severe headache.  You faint.  You have blurred vision or spots in your vision.  You have bad-smelling vaginal discharge.  You have thoughts about hurting yourself or your baby. If you ever feel like you may hurt yourself or others, or have thoughts about taking your own life, get help   right away. You can go to the nearest emergency department or call:  Your local emergency services (911 in the [place]  A suicide crisis helpline, such as the National Suicide Prevention Lifeline at 1-800-273-8255. This is open 24 hours a day. Summary  The period of time right after you deliver your newborn up to 6-12 weeks after delivery is called the postpartum period.  Gradually return to your normal activities as told by your health care provider.  Keep all follow-up visits for you and your baby as told by your health care provider. This information is not intended to replace advice given to you by your health care provider. Make sure you discuss any questions you have with your health care provider. Document Revised: 01/20/2017 Document Reviewed: 10/31/2016 Elsevier Patient Education  2020 Elsevier Inc. Postpartum Hypertension Postpartum hypertension is high blood pressure that remains higher than normal after childbirth. You may not realize that you have  postpartum hypertension if your blood pressure is not being checked regularly. In most cases, postpartum hypertension will go away on its own, usually within a week of delivery. However, for some women, medical treatment is required to prevent serious complications, such as seizures or stroke. What are the causes? This condition may be caused by one or more of the following:  Hypertension that existed before pregnancy (chronic hypertension).  Hypertension that comes on as a result of pregnancy (gestational hypertension).  Hypertensive disorders during pregnancy (preeclampsia) or seizures in women who have high blood pressure during pregnancy (eclampsia).  A condition in which the liver, platelets, and red blood cells are damaged during pregnancy (HELLP syndrome).  A condition in which the thyroid produces too much hormones (hyperthyroidism).  Other rare problems of the nerves (neurological disorders) or blood disorders. In some cases, the cause may not be known. What increases the risk? The following factors may make you more likely to develop this condition:  Chronic hypertension. In some cases, this may not have been diagnosed before pregnancy.  Obesity.  Type 2 diabetes.  Kidney disease.  History of preeclampsia or eclampsia.  Other medical conditions that change the level of hormones in the body (hormonal imbalance). What are the signs or symptoms? As with all types of hypertension, postpartum hypertension may not have any symptoms. Depending on how high your blood pressure is, you may experience:  Headaches. These may be mild, moderate, or severe. They may also be steady, constant, or sudden in onset (thunderclap headache).  Changes in your ability to see (visual changes).  Dizziness.  Shortness of breath.  Swelling of your hands, feet, lower legs, or face. In some cases, you may have swelling in more than one of these locations.  Heart palpitations or a racing  heartbeat.  Difficulty breathing while lying down.  Decrease in the amount of urine that you pass. Other rare signs and symptoms may include:  Sweating more than usual. This lasts longer than a few days after delivery.  Chest pain.  Sudden dizziness when you get up from sitting or lying down.  Seizures.  Nausea or vomiting.  Abdominal pain. How is this diagnosed? This condition may be diagnosed based on the results of a physical exam, blood pressure measurements, and blood and urine tests. You may also have other tests, such as a CT scan or an MRI, to check for other problems of postpartum hypertension. How is this treated? If blood pressure is high enough to require treatment, your options may include:  Medicines to reduce blood pressure (  antihypertensives). Tell your health care provider if you are breastfeeding or if you plan to breastfeed. There are many antihypertensive medicines that are safe to take while breastfeeding.  Stopping medicines that may be causing hypertension.  Treating medical conditions that are causing hypertension.  Treating the complications of hypertension, such as seizures, stroke, or kidney problems. Your health care provider will also continue to monitor your blood pressure closely until it is within a safe range for you. Follow these instructions at home:  Take over-the-counter and prescription medicines only as told by your health care provider.  Return to your normal activities as told by your health care provider. Ask your health care provider what activities are safe for you.  Do not use any products that contain nicotine or tobacco, such as cigarettes and e-cigarettes. If you need help quitting, ask your health care provider.  Keep all follow-up visits as told by your health care provider. This is important. Contact a health care provider if:  Your symptoms get worse.  You have new symptoms, such as: ? A headache that does not get  better. ? Dizziness. ? Visual changes. Get help right away if:  You suddenly develop swelling in your hands, ankles, or face.  You have sudden, rapid weight gain.  You develop difficulty breathing, chest pain, racing heartbeat, or heart palpitations.  You develop severe pain in your abdomen.  You have any symptoms of a stroke. "BE FAST" is an easy way to remember the main warning signs of a stroke: ? B - Balance. Signs are dizziness, sudden trouble walking, or loss of balance. ? E - Eyes. Signs are trouble seeing or a sudden change in vision. ? F - Face. Signs are sudden weakness or numbness of the face, or the face or eyelid drooping on one side. ? A - Arms. Signs are weakness or numbness in an arm. This happens suddenly and usually on one side of the body. ? S - Speech. Signs are sudden trouble speaking, slurred speech, or trouble understanding what people say. ? T - Time. Time to call emergency services. Write down what time symptoms started.  You have other signs of a stroke, such as: ? A sudden, severe headache with no known cause. ? Nausea or vomiting. ? Seizure. These symptoms may represent a serious problem that is an emergency. Do not wait to see if the symptoms will go away. Get medical help right away. Call your local emergency services (911 in the [place] Do not drive yourself to the hospital. Summary  Postpartum hypertension is high blood pressure that remains higher than normal after childbirth.  In most cases, postpartum hypertension will go away on its own, usually within a week of delivery.  For some women, medical treatment is required to prevent serious complications, such as seizures or stroke. This information is not intended to replace advice given to you by your health care provider. Make sure you discuss any questions you have with your health care provider. Document Revised: 02/23/2018 Document Reviewed: 11/07/2016 Elsevier Patient Education  2020 Elsevier  Inc.  

## 2019-10-12 NOTE — Lactation Note (Signed)
This note was copied from a baby's chart. Lactation Consultation Note  Patient Name: Julia Potter [id number] Date: 10/12/2019 Reason for consult: Follow-up assessment;Preterm <34wks  0937 - I followed up with Ms. Sarinana and conducted discharge education. Her support person had just fed baby "[name]" 60 mls of formula by bottle (self-reported). I reviewed the LPI guidelines and the recommended supplementation amounts for day 3.  Ms. Youtz states that baby is latching to the breast. She feels that her milk has not come to volume. I reviewed the timeline of when to expect mature milk to come in.  I proposed that she always breast feed baby first on demand and then supplement following. While her support person supplements, she can use her DEBP and pump both breasts for 15-20 minutes.  Ms. Gilkey has ordered a pump from [employer]. She has [facility]. She is aware that they have options for loaner pumps. She prefers to purchase her own pump.  Ms. Westerhoff states that they will follow up tomorrow (Sunday) with [facility]. I encouraged her to seek follow up lactation OP support there. I also shared our breast feeding brochure and reviewed our support resources.  Finally, I showed Ms. Sainato how to use the manual pump that comes with her DEBP kit. I also demonstrated paced bottle feeding and encouraged her support person to slow the feedings down a bit.   They verbalized understanding . No additional questions at this time.   Feeding Feeding Type: Bottle Fed - Formula Nipple Type: Slow - flow   Interventions Interventions: Breast feeding basics reviewed;DEBP  Lactation Tools Discussed/Used Tools: Pump Breast pump type: Double-Electric Breast Pump WIC Program: Yes Pump Review: Setup, frequency, and cleaning;Milk Storage   Consult Status Consult Status: Complete Date: 10/12/19 Follow-up type: In-patient    Lenore Manner 10/12/2019, 10:37 AM

## 2019-10-12 NOTE — Progress Notes (Signed)
Accidentally put monitor

## 2019-10-14 ENCOUNTER — Telehealth: Payer: Self-pay | Admitting: *Deleted

## 2019-10-14 LAB — SURGICAL PATHOLOGY

## 2019-10-14 NOTE — Telephone Encounter (Signed)
Contacted patient to complete transition of care assessment: Transition Care Management Follow-up Telephone Call  Date of discharge and from where: 10/12/19, [facility]  How have you been since you were released from the hospital? "I'm ok"   Any questions or concerns? No  Items Reviewed:  Did the pt receive and understand the discharge instructions provided? Yes   Medications obtained and verified? Yes   Any new allergies since your discharge? No   Dietary orders reviewed? YES  Do you have support at home? Yes family  Functional Questionnaire: (I = Independent and D = Dependent) ADLs: I  Bathing/Dressing- I  Meal Prep- I  Eating- I  Maintaining continence- I  Transferring/Ambulation- I  Managing Meds- I  Follow up appointments reviewed:   PCP Hospital f/u appt confirmed? No Patient would like to be assigned a PCPSpecialist Hospital f/u appt confirmed? Yes  Scheduled to see [facility] on 10/17/19 @ 1400 and Thressa Sheller, [place], on 11/12/19 at 1015.  Are transportation arrangements needed? No   If their condition worsens, is the pt aware to call PCP or go to the Emergency Dept.? YES  Was the patient provided with contact information for the PCP's office or ED? YES  NWas to pt encouraged to call back with questions or concerns? YES  Burnard Bunting, RN, BSN, CCRN Patient Engagement Center [phone]

## 2019-10-15 ENCOUNTER — Ambulatory Visit: Payer: Medicaid Other

## 2019-10-15 ENCOUNTER — Other Ambulatory Visit (HOSPITAL_COMMUNITY): Payer: Medicaid Other

## 2019-10-15 ENCOUNTER — Encounter: Payer: Self-pay | Admitting: Certified Nurse Midwife

## 2019-10-16 ENCOUNTER — Other Ambulatory Visit: Payer: Self-pay

## 2019-10-17 ENCOUNTER — Inpatient Hospital Stay (HOSPITAL_COMMUNITY): Payer: Medicaid Other

## 2019-10-17 ENCOUNTER — Other Ambulatory Visit: Payer: Self-pay

## 2019-10-17 ENCOUNTER — Ambulatory Visit (INDEPENDENT_AMBULATORY_CARE_PROVIDER_SITE_OTHER): Payer: Medicaid Other

## 2019-10-17 ENCOUNTER — Other Ambulatory Visit: Payer: Self-pay | Admitting: Obstetrics and Gynecology

## 2019-10-17 ENCOUNTER — Inpatient Hospital Stay (HOSPITAL_COMMUNITY)
Admission: AD | Admit: 2019-10-17 | Payer: Medicaid Other | Source: Home / Self Care | Admitting: Obstetrics and Gynecology

## 2019-10-17 VITALS — BP 141/93 | HR 111 | Ht 62.0 in | Wt 221.0 lb

## 2019-10-17 DIAGNOSIS — O165 Unspecified maternal hypertension, complicating the puerperium: Secondary | ICD-10-CM

## 2019-10-17 DIAGNOSIS — I10 Essential (primary) hypertension: Secondary | ICD-10-CM

## 2019-10-17 MED ORDER — METHYLDOPA-HYDROCHLOROTHIAZIDE 250-25 MG PO TABS: 1.0000 | ORAL_TABLET | Freq: Every day | ORAL | 1 refills | Status: DC

## 2019-10-17 MED ORDER — HYDROCHLOROTHIAZIDE 25 MG PO TABS: 25.0000 mg | ORAL_TABLET | Freq: Every day | ORAL | 1 refills | Status: DC

## 2019-10-17 NOTE — Progress Notes (Addendum)
Pt here for BP check: Pt currently taking Nifedipine 30 mg.  BP:  L-133/98 R-141/93  Discussed with Dr. Donavan Foil, advised Pt to start Hydrochlorothiazide 25mg , & come back in 1 week. Pt verbalized understanding.

## 2019-10-17 NOTE — Progress Notes (Signed)
HCTZ added to procardia XL to address continued hypertension

## 2019-10-22 ENCOUNTER — Encounter: Payer: Self-pay | Admitting: Family Medicine

## 2019-10-24 ENCOUNTER — Other Ambulatory Visit: Payer: Self-pay

## 2019-10-24 ENCOUNTER — Telehealth (INDEPENDENT_AMBULATORY_CARE_PROVIDER_SITE_OTHER): Payer: Medicaid Other | Admitting: *Deleted

## 2019-10-24 DIAGNOSIS — Z5320 Procedure and treatment not carried out because of patient's decision for unspecified reasons: Secondary | ICD-10-CM

## 2019-10-24 DIAGNOSIS — Z0130 Encounter for examination of blood pressure without abnormal findings: Secondary | ICD-10-CM

## 2019-10-24 IMAGING — US US PELVIS COMPLETE WITH TRANSVAGINAL
1 series · 15 of 25 positions shown · non-contrast
Comparison: 01/03/2018

CLINICAL DATA: Recurrent miscarriage



[Series 1: us pelvis complete with transvaginal · 49 acquisitions, 15 frames shown]
[im 1/49]
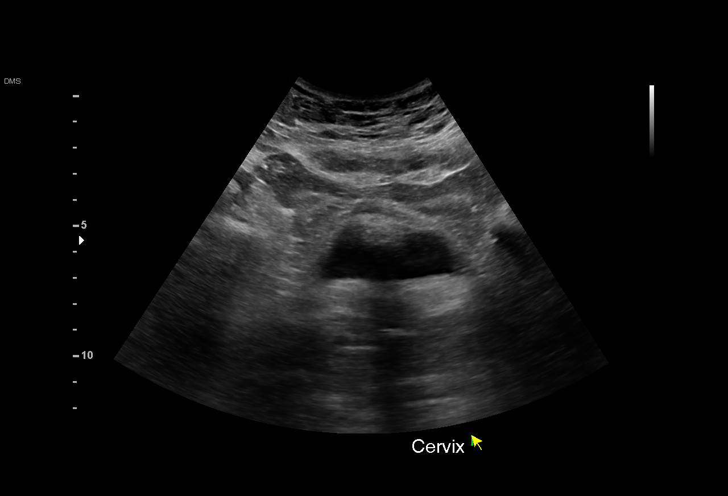
[im 5/49]
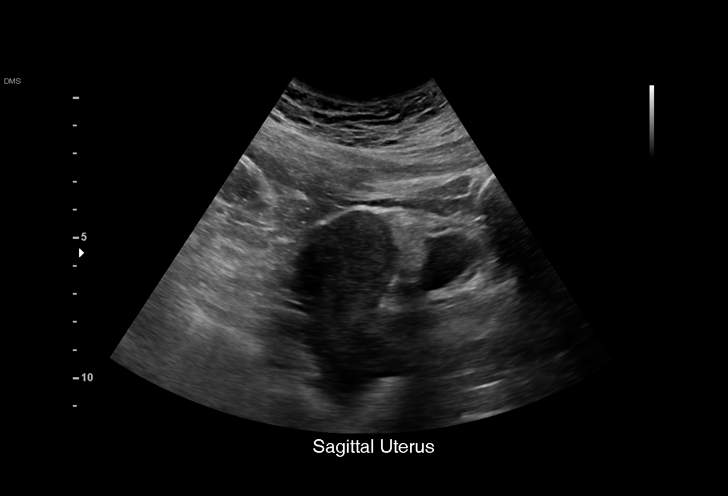
[im 9/49]
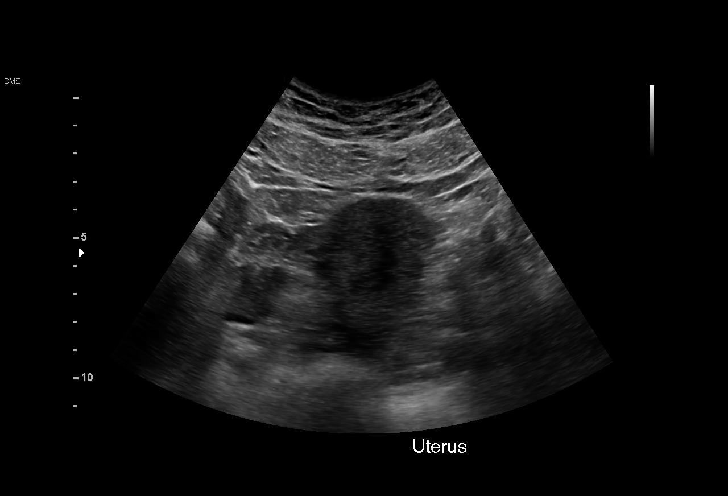
[im 11/49]
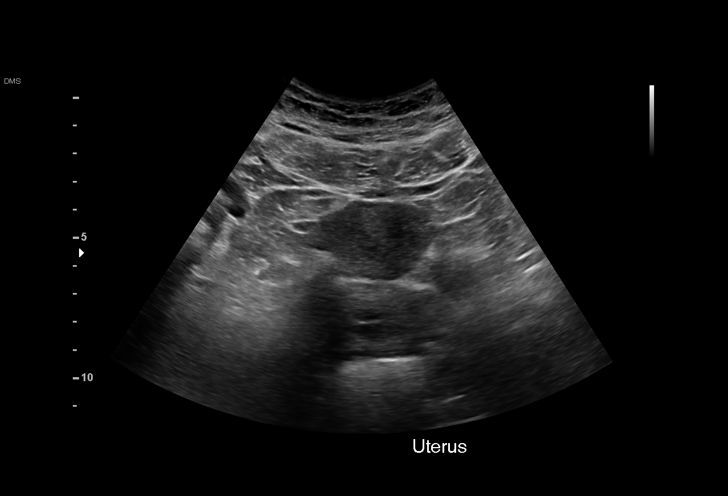
[im 15/49]
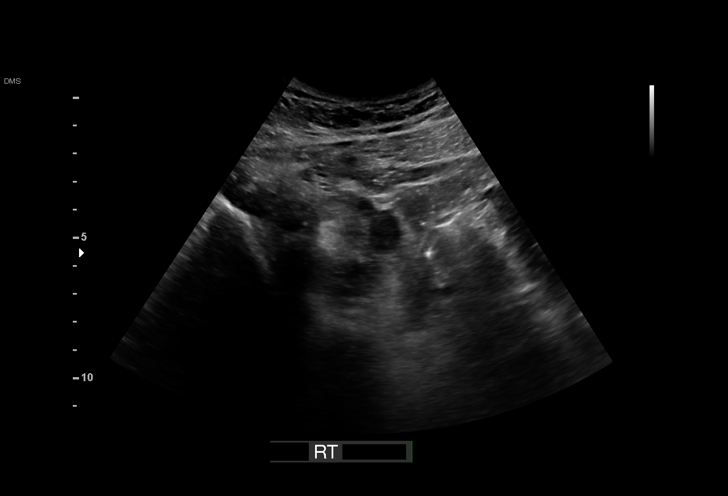
[im 19/49]
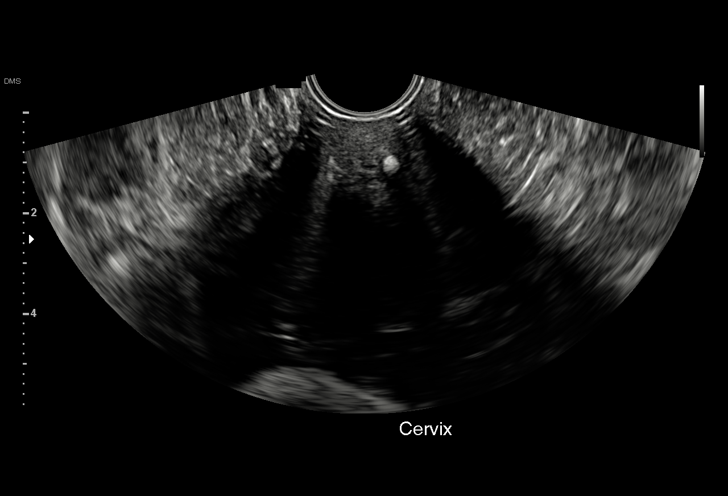
[im 21/49]
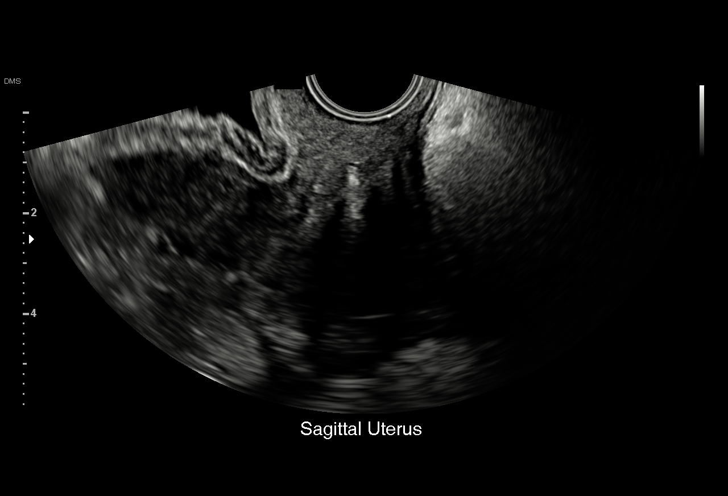
[im 25/49]
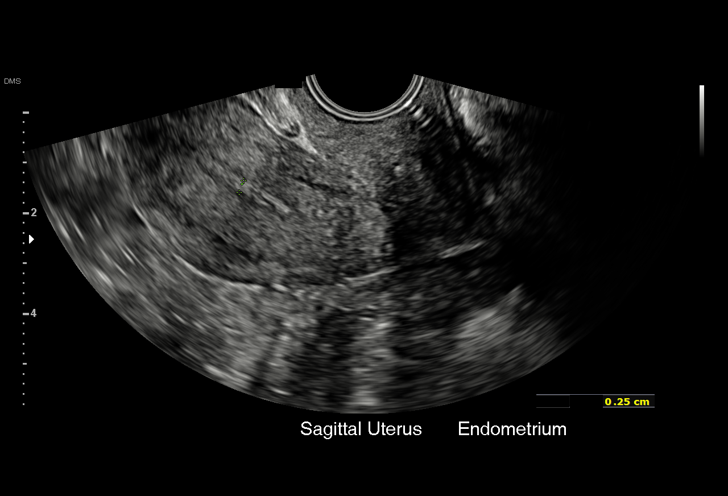
[im 29/49]
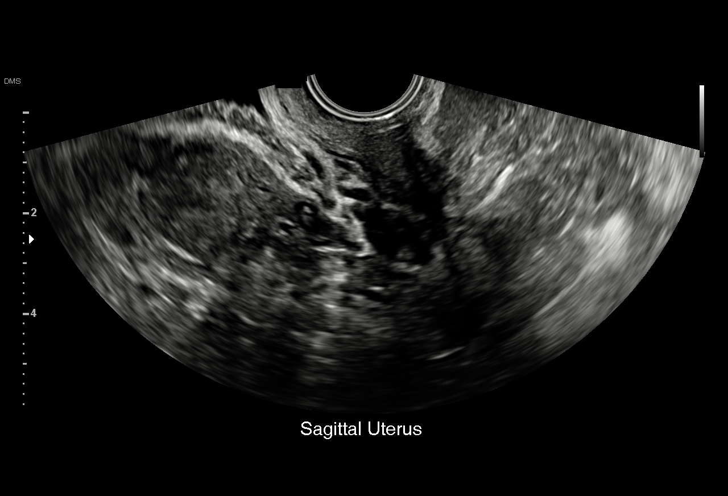
[im 31/49]
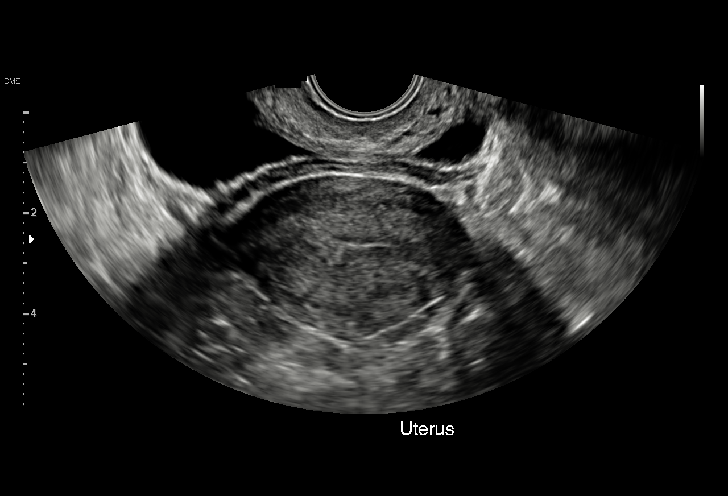
[im 35/49]
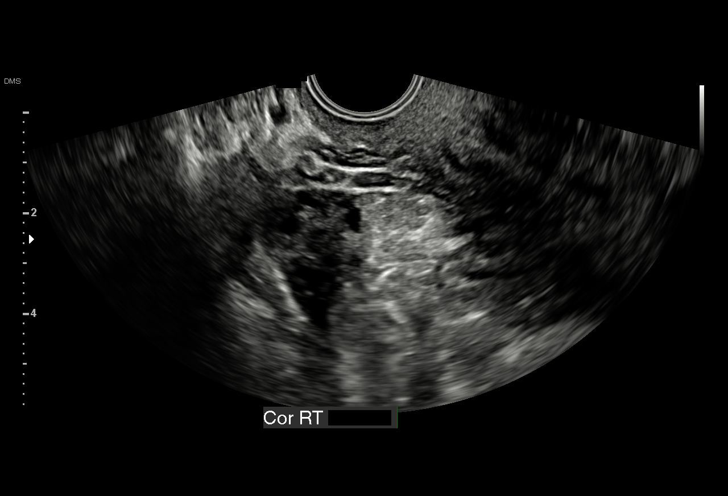
[im 39/49]
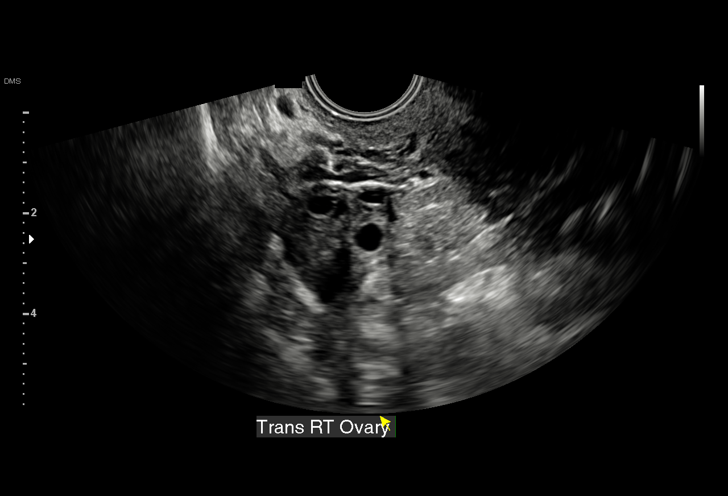
[im 41/49]
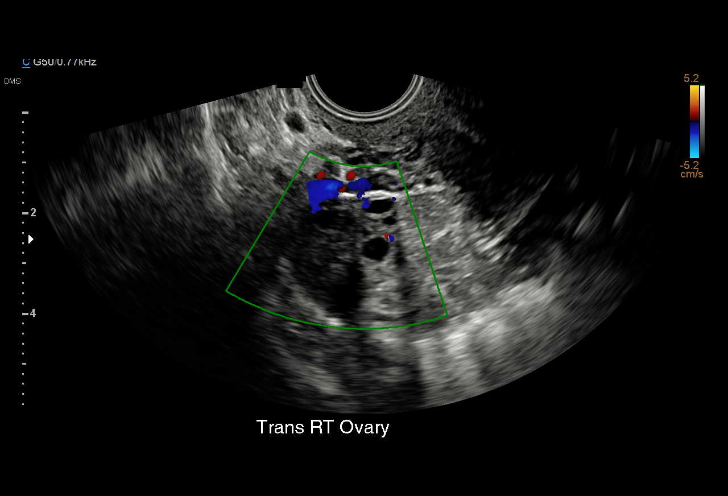
[im 45/49]
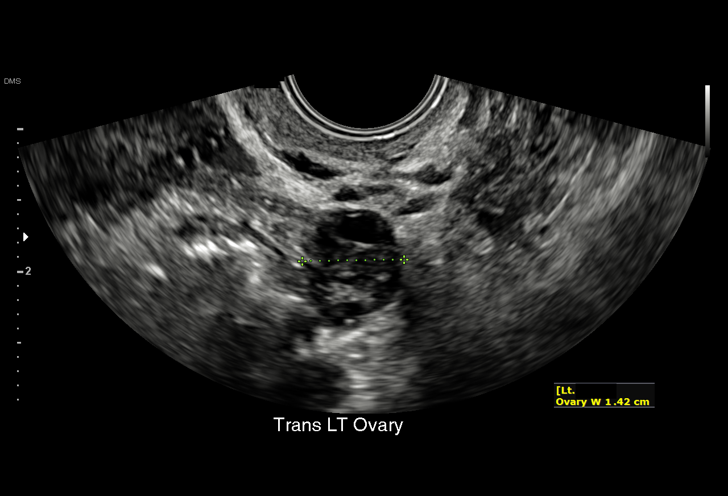
[im 49/49]
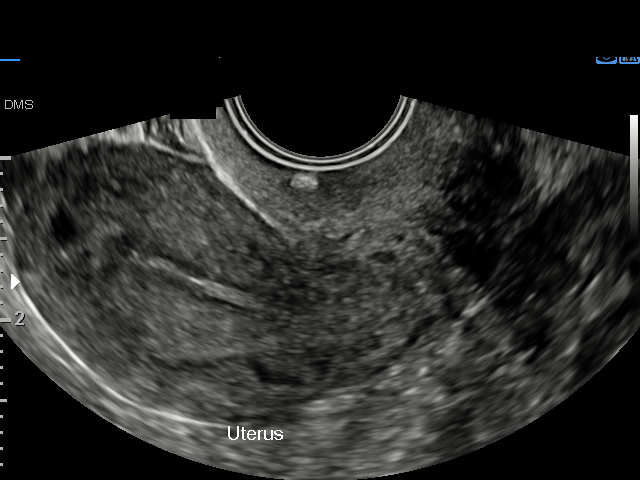

[15 of 25 positions shown; findings below may reference images not displayed]

FINDINGS: Uterus

Measurements: 6.8 x 3.2 x 4.9 cm = volume: 57 mL. No fibroids or
other mass visualized.

Endometrium

Thickness: Normal thickness, 3 mm.  No focal abnormality visualized.

Right ovary

Measurements: 3.2 x 2.0 x 2.4 cm = volume: 7.7 mL. Normal
appearance/no adnexal mass.

Left ovary

Measurements: 2.2 x 1.5 x 1.4 cm = volume: 2.4 mL. Normal
appearance/no adnexal mass.

Other findings

No abnormal free fluid.
IMPRESSION: Normal study.

## 2019-10-24 NOTE — Progress Notes (Signed)
10:40 Patient not available for virtual visit for patient requested nurse visit for bp check .  I called and left a message I am calling for her virtual visit and did not see you online. Please connect online or answer when I call back in a moment. Bonita Quin, RN  10:45 Patient still not connected virtually. I called again and left a message since I have not been able to connect virtually or by phone she will need to call to reschedule if she needs an appointment. Legrand Como

## 2019-10-28 ENCOUNTER — Ambulatory Visit: Payer: Medicaid Other

## 2019-10-29 ENCOUNTER — Encounter: Payer: Self-pay | Admitting: Nurse Practitioner

## 2019-11-05 ENCOUNTER — Telehealth: Payer: Self-pay

## 2019-11-05 NOTE — Telephone Encounter (Signed)
Prior authorization request received from [facility] via fax for methyldopa-hydrochlorothiazide. Per chart review, medication has been discontinued by provider due to entry error. Called pharmacy to explain this has been discontinued and to request medication be removed.

## 2019-11-12 ENCOUNTER — Other Ambulatory Visit: Payer: Self-pay

## 2019-11-12 ENCOUNTER — Encounter: Payer: Self-pay | Admitting: Advanced Practice Midwife

## 2019-11-12 ENCOUNTER — Ambulatory Visit (INDEPENDENT_AMBULATORY_CARE_PROVIDER_SITE_OTHER): Payer: Medicaid Other | Admitting: Advanced Practice Midwife

## 2019-11-12 NOTE — Progress Notes (Signed)
Post Partum Visit Note  Julia Potter is a 29 y.o. [id number] female who presents for a postpartum visit. She is 4 weeks postpartum following a SVD.  I have fully reviewed the prenatal and intrapartum course. The delivery was at 36 gestational weeks.  Anesthesia: CSE. Postpartum course has been unremarkable. Baby is doing well. Baby is feeding by bottle - Similac Neosure. Bleeding thick, heavy lochia. Bowel function is normal. Bladder function is normal. Patient is sexually active. Contraception method is none. Postpartum depression screening: Negative.  The pregnancy intention screening data noted above was reviewed. Potential methods of contraception were discussed. The patient elected to proceed with Female Condom.    Edinburgh Postnatal Depression Scale - 11/12/19 1015      Edinburgh Postnatal Depression Scale:  In the Past 7 Days   I have been able to laugh and see the funny side of things. 0    I have looked forward with enjoyment to things. 0    I have blamed myself unnecessarily when things went wrong. 0    I have been anxious or worried for no good reason. 1    I have felt scared or panicky for no good reason. 0    Things have been getting on top of me. 0    I have been so unhappy that I have had difficulty sleeping. 0    I have felt sad or miserable. 0    I have been so unhappy that I have been crying. 0    The thought of harming myself has occurred to me. 0    Edinburgh Postnatal Depression Scale Total 1            The following portions of the patient's history were reviewed and updated as appropriate: allergies, current medications, past family history, past medical history, past social history, past surgical history and problem list.  Review of Systems Pertinent items are noted in HPI.    Objective:  Blood pressure 134/88, pulse 83, weight 225 lb 14.4 oz (102.5 kg), last menstrual period 11/11/2019, unknown if currently breastfeeding.  Physical Exam Vitals and  nursing note reviewed.  Constitutional:      General: She is not in acute distress. HENT:     Head: Normocephalic.  Cardiovascular:     Rate and Rhythm: Normal rate.  Pulmonary:     Effort: Pulmonary effort is normal.  Abdominal:     Palpations: Abdomen is soft.     Tenderness: There is no abdominal tenderness.  Skin:    General: Skin is warm and dry.  Neurological:     Mental Status: She is alert and oriented to person, place, and time.  Psychiatric:        Mood and Affect: Mood normal.        Behavior: Behavior normal.      Assessment:    normal postpartum exam. Pap smear 04/25/2019  Plan:   Essential components of care per ACOG recommendations:  1.  Mood and well being: Patient with negative depression screening today. Reviewed local resources for support.  - Patient does not use tobacco. NA If using tobacco we discussed reduction and for recently cessation risk of relapse - hx of drug use? No  NA If yes, discussed support systems  2. Infant care and feeding:  -Patient currently breastmilk feeding? No NA If breastmilk feeding discussed return to work and pumping. If needed, patient was provided letter for work to allow for every 2-3 hr pumping breaks,  and to be granted a private location to express breastmilk and refrigerated area to store breastmilk. Reviewed importance of draining breast regularly to support lactation. -Social determinants of health (SDOH) reviewed in EPIC. No concerns  3. Sexuality, contraception and birth spacing - Patient does not want a pregnancy in the next year.  Desired family size is 4 children.  - Reviewed forms of contraception in tiered fashion. Patient desired condoms today.   - Discussed birth spacing of 18 months  4. Sleep and fatigue -Encouraged family/partner/community support of 4 hrs of uninterrupted sleep to help with mood and fatigue  5. Physical Recovery  - Discussed patients delivery and complications - Patient had a NA  degree laceration, perineal healing reviewed. Patient expressed understanding - Patient has urinary incontinence? No - Patient is safe to resume physical and sexual activity  6.  Health Maintenance - Last pap smear done 04/2019 and was normal with negative HPV. NA Mammogram  7. Chronic Disease - PCP follow up  Thressa Sheller DNP, CNM  11/12/19  10:38 AM

## 2020-01-03 ENCOUNTER — Encounter: Payer: Self-pay | Admitting: *Deleted

## 2020-05-21 DIAGNOSIS — Z20822 Contact with and (suspected) exposure to covid-19: Secondary | ICD-10-CM | POA: Diagnosis not present

## 2020-11-26 IMAGING — US US OB TRANSVAGINAL
2 series · 15 of 28 positions shown · non-contrast
Comparison: March 05, 2019

CLINICAL DATA: Recent obstetrical ultrasound which did not
demonstrate fetal pole

EXAM:
TRANSVAGINAL OB ULTRASOUND
TECHNIQUE: Transvaginal ultrasound was performed for complete evaluation of the
gestation as well as the maternal uterus, adnexal regions, and
pelvic cul-de-sac.

[Series 1: us ob transvaginal · 14 of 31 slices shown (1 of 2)]
[im 1/31]
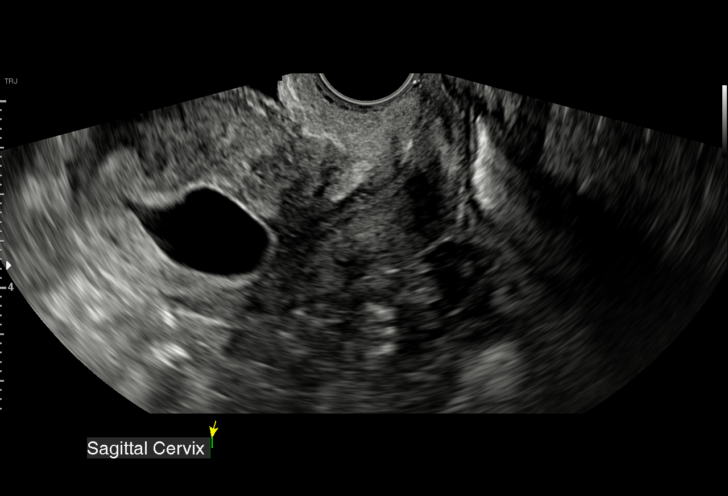
[im 3/31]
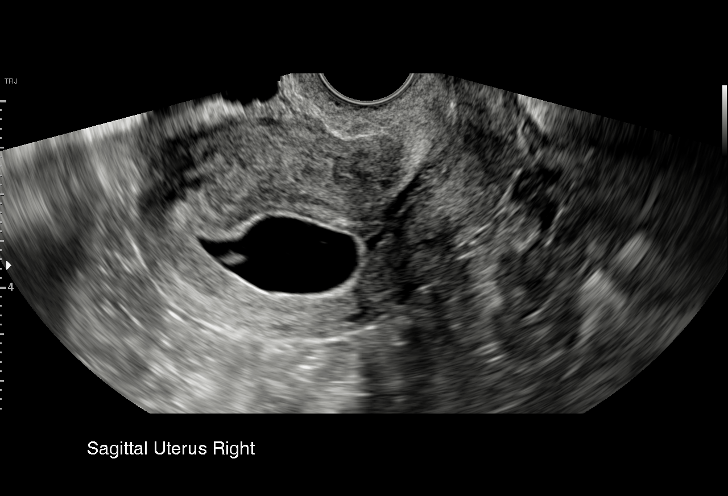
[im 5/31]
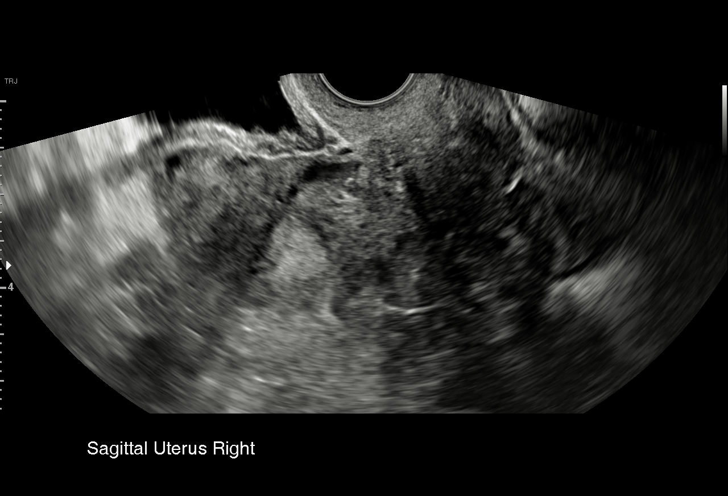
[im 8/31]
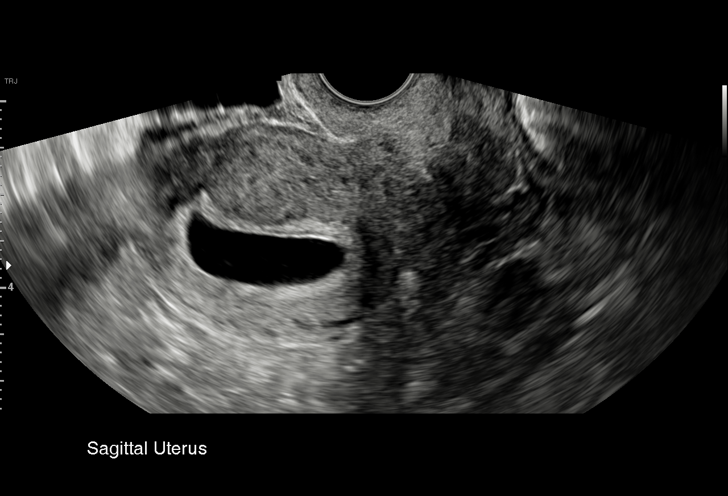
[im 10/31]
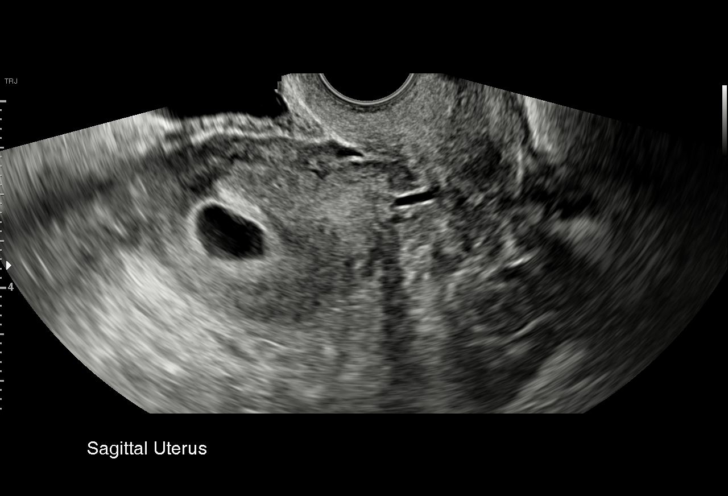
[im 13/31]
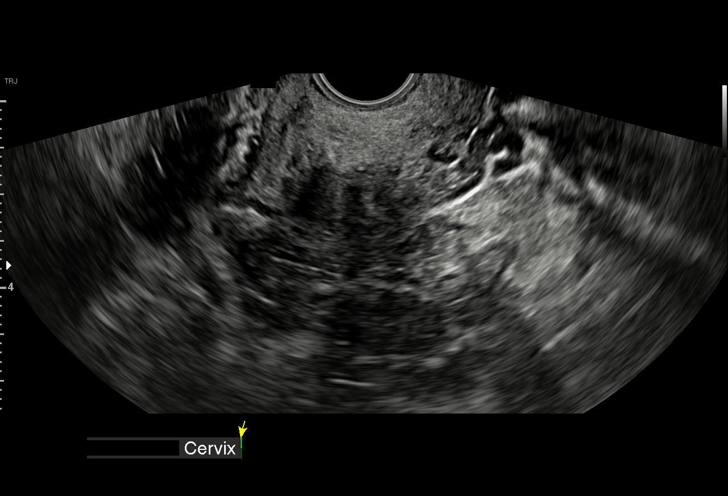
[im 15/31]
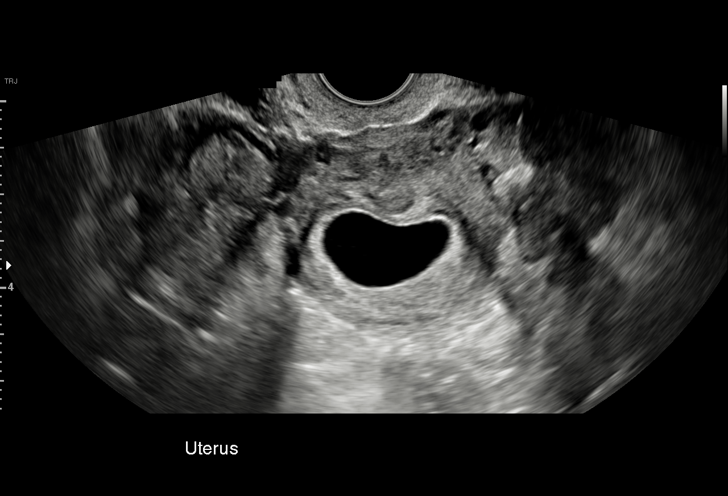
[im 17/31]
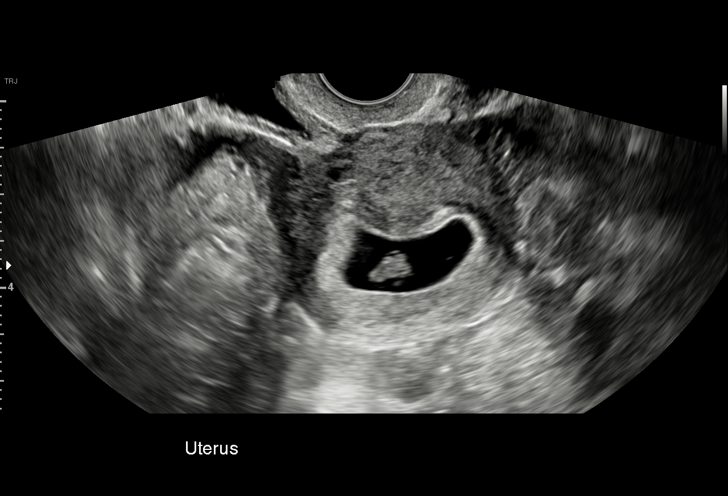
[im 19/31]
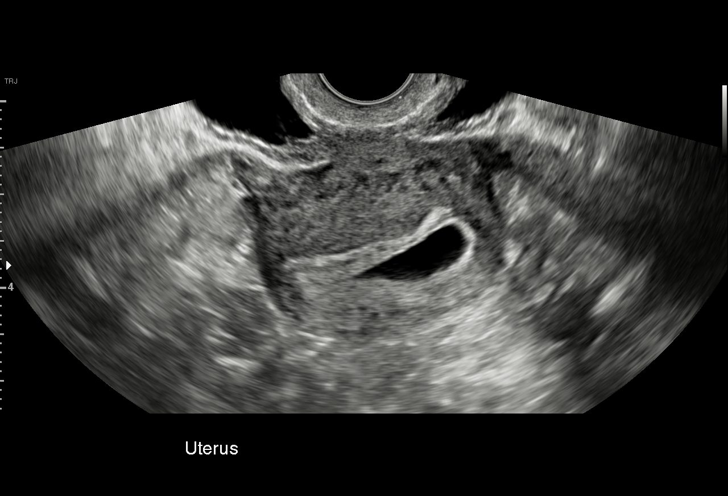
[im 21/31]
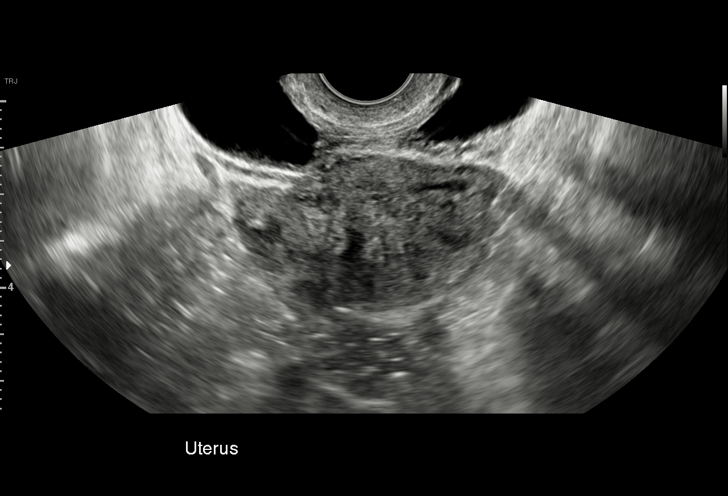
[im 23/31]
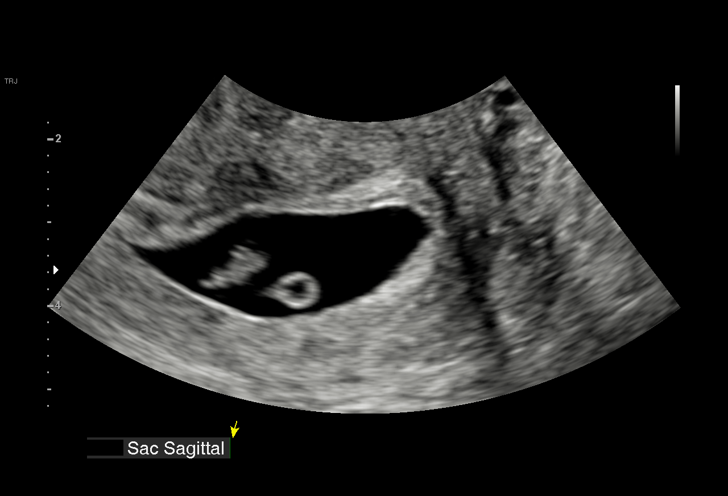
[im 26/31]
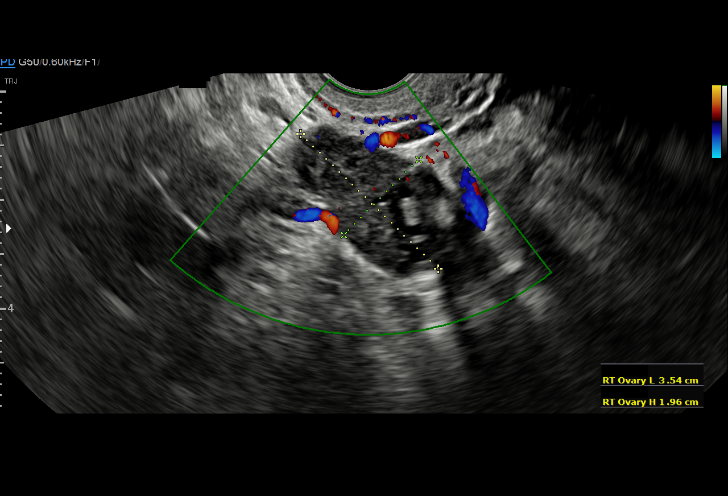
[im 28/31]
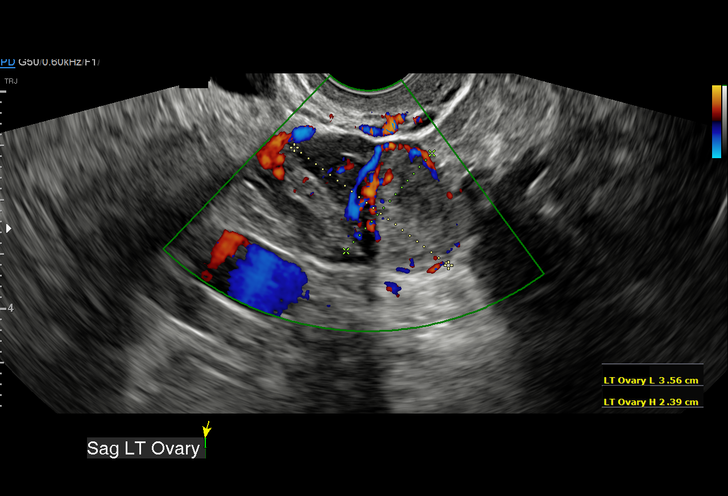
[im 31/31]
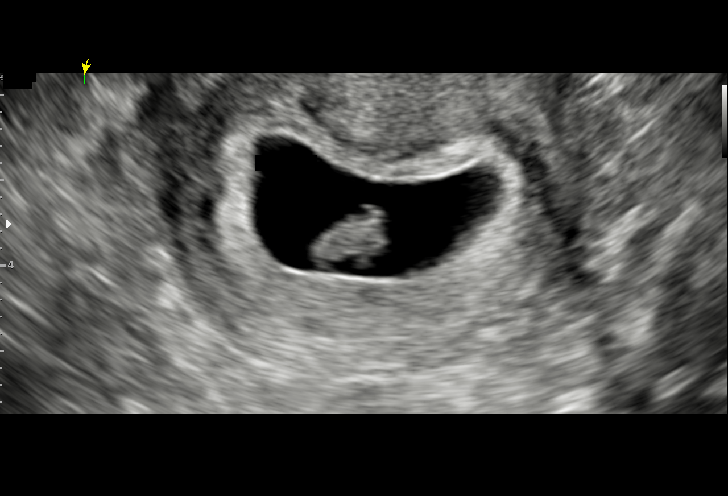

[Series 2: us ob transvaginal · 1 of 3 slices shown (2 of 2)]
[im 3/3]
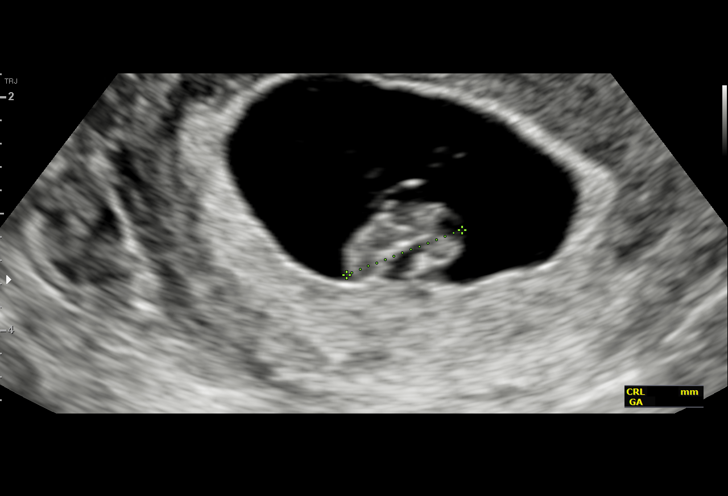

[15 of 28 positions shown; findings below may reference images not displayed]

FINDINGS: Intrauterine gestational sac: Visualized

Yolk sac:  Visualized

Embryo:  Visualized

Cardiac Activity: Visualized

Heart Rate: 146 bpm

CRL:   11 mm   7 w 1 d                  US EDC: November 05, 2019

Subchorionic hemorrhage:  None visualized.

Maternal uterus/adnexae: Cervical os is closed. Right ovary measures
3.5 x 2.0 x 1.7 cm. Left ovary measures 3.6 x 2.4 x 2.1 cm. There is
no extrauterine pelvic mass or fluid.
IMPRESSION: Single live intrauterine gestation with estimated gestational age of
approximately 7 weeks. No evidence of subchorionic hemorrhage. Study
otherwise unremarkable.

## 2021-12-16 ENCOUNTER — Encounter: Payer: Self-pay | Admitting: General Practice

## 2022-03-03 ENCOUNTER — Telehealth: Payer: Self-pay | Admitting: Family Medicine

## 2022-03-03 NOTE — Telephone Encounter (Signed)
Refill on Valacyclovir

## 2022-03-04 MED ORDER — VALACYCLOVIR HCL 500 MG PO TABS: ORAL_TABLET | ORAL | 99 refills | Status: DC

## 2022-03-04 NOTE — Telephone Encounter (Signed)
Returned pt's call and she did not answer. I left a message stating that I will send in the requested prescription. She needs to call office and schedule Annual Gyn exam as she has not been seen in over 2 years. She may call abck for questions.

## 2022-07-25 ENCOUNTER — Ambulatory Visit: Payer: Medicaid Other | Admitting: Internal Medicine

## 2022-08-08 ENCOUNTER — Encounter: Payer: Self-pay | Admitting: Family Medicine

## 2022-08-08 ENCOUNTER — Ambulatory Visit (INDEPENDENT_AMBULATORY_CARE_PROVIDER_SITE_OTHER): Payer: Medicaid Other | Admitting: Family Medicine

## 2022-08-08 VITALS — BP 124/80 | HR 70 | Temp 98.4°F | Ht 64.0 in | Wt 179.0 lb

## 2022-08-08 DIAGNOSIS — G8929 Other chronic pain: Secondary | ICD-10-CM

## 2022-08-08 DIAGNOSIS — Z8679 Personal history of other diseases of the circulatory system: Secondary | ICD-10-CM | POA: Diagnosis not present

## 2022-08-08 DIAGNOSIS — E669 Obesity, unspecified: Secondary | ICD-10-CM | POA: Diagnosis not present

## 2022-08-08 DIAGNOSIS — M25562 Pain in left knee: Secondary | ICD-10-CM | POA: Diagnosis not present

## 2022-08-08 DIAGNOSIS — R222 Localized swelling, mass and lump, trunk: Secondary | ICD-10-CM

## 2022-08-08 MED ORDER — IBUPROFEN 600 MG PO TABS: 600.0000 mg | ORAL_TABLET | Freq: Three times a day (TID) | ORAL | 0 refills | Status: DC | PRN

## 2022-08-08 NOTE — Progress Notes (Unsigned)
Assessment/Plan:   Problem List Items Addressed This Visit   None Visit Diagnoses     Chronic pain of left knee    -  Primary   Relevant Medications   ibuprofen (ADVIL) 600 MG tablet   Other Relevant Orders   DG Knee Complete 4 Views Left   Ambulatory referral to Orthopedics   Nodule of skin of right chest       History of hypertension           Medications Discontinued During This Encounter  Medication Reason   aspirin EC 81 MG tablet    hydrochlorothiazide (HYDRODIURIL) 25 MG tablet    docusate sodium (COLACE) 100 MG capsule    ferrous sulfate 325 (65 FE) MG tablet    NIFEdipine (ADALAT CC) 30 MG 24 hr tablet     Return if symptoms worsen or fail to improve, for knee pain .    Subjective:   Encounter date: 08/08/2022  Julia Potter is a 32 y.o. female who has Psychiatric illness; Hirsutism; BMI 40.0-44.9, adult (HCC); Supervision of high risk pregnancy, antepartum; History of pregnancy induced hypertension; Obesity; Obesity in pregnancy; Herpes; History of multiple miscarriages; Rh negative state in antepartum period; History of preterm delivery; History of PCOS; Abnormal TSH; Preterm delivery, delivered; Elevated blood pressure affecting pregnancy in third trimester, antepartum; Excessive weight gain in pregnancy in third trimester; Chronic hypertension in pregnancy; Chronic hypertension with superimposed severe preeclampsia; Delay postpartum hemorrhage; Severe preeclampsia, delivered; Anemia, postpartum; and Anemia due to acute blood loss on their problem list..   She  has a past medical history of Abnormal uterine bleeding (AUB) (01/20/2016), ALLERGIC RHINITIS, Anxiety, Chlamydia, Depression, Herpes, HSV infection, Pregnancy induced hypertension (2010), Rh negative state in antepartum period (05/10/2017), and Seasonal allergies..   She presents with chief complaint of Establish Care (Left knee pain after falling x 1 month ago. ) .   HPI:  Discussed the use of AI  scribe software for clinical note transcription with the patient, who gave verbal consent to proceed.  History of Present Illness   Julia Potter, a patient with a history of hypertension, presents with left knee pain that has been worsening over the past month following a fall. The patient reports that the knee pain is intermittent, occurring every other day, and is exacerbated by bending the knee or increased physical activity. The pain is described as agonizing, leading to avoidance of activity when it occurs. The patient has been managing the pain with Tylenol and at-home remedies such as ice, elevation, and knee wrapping. No imaging or external evaluation has been done for this knee pain.  In addition to the knee pain, the patient also mentions a skin growth on the right side of the chest that has been present for over a year. The growth is not painful and has not changed significantly over time. The patient has been monitoring the growth and notes a slight decrease in size with recent weight loss.  The patient also has a history of hypertension, which is currently well-controlled. The patient has been actively managing her health through regular gym workouts and a healthy diet, resulting in significant weight loss.      ROS  Past Surgical History:  Procedure Laterality Date   DILATION AND EVACUATION N/A 05/23/2017   Procedure: DILATATION AND EVACUATION;  Surgeon: [place] Bing, MD;  Location: [facility];  Service: Gynecology;  Laterality: N/A;  needs [place]   INTRAUTERINE DEVICE INSERTION  09/2010   IUD REMOVAL  Outpatient Medications Prior to Visit  Medication Sig Dispense Refill   ibuprofen (ADVIL) 600 MG tablet Take 1 tablet (600 mg total) by mouth every 6 (six) hours. 30 tablet 0   valACYclovir (VALTREX) 500 MG tablet Take 1 tablet (500 mg) 2 times daily for 3 days for symptoms of outbreak 6 tablet PRN   aspirin EC 81 MG tablet Take 1 tablet (81 mg total) by mouth daily. Take after 12  weeks for prevention of preeclampsia later in pregnancy (Patient not taking: Reported on 10/10/2019) 300 tablet 2   docusate sodium (COLACE) 100 MG capsule Take 1 capsule (100 mg total) by mouth 2 (two) times daily as needed for mild constipation or moderate constipation. (Patient not taking: Reported on 11/12/2019) 30 capsule 2   ferrous sulfate 325 (65 FE) MG tablet Take 1 tablet (325 mg total) by mouth 2 (two) times daily with a meal. (Patient not taking: Reported on 08/08/2022) 60 tablet 3   hydrochlorothiazide (HYDRODIURIL) 25 MG tablet Take 1 tablet (25 mg total) by mouth daily. (Patient not taking: Reported on 08/08/2022) 30 tablet 1   NIFEdipine (ADALAT CC) 30 MG 24 hr tablet Take 1 tablet (30 mg total) by mouth daily. (Patient not taking: Reported on 08/08/2022) 30 tablet 2   No facility-administered medications prior to visit.    Family History  Problem Relation Age of Onset   Hypertension Mother    Diabetes Maternal Grandmother    Asthma Daughter     Social History   Socioeconomic History   Marital status: Single    Spouse name: Not on file   Number of children: Not on file   Years of education: Not on file   Highest education level: Not on file  Occupational History   Not on file  Tobacco Use   Smoking status: Never   Smokeless tobacco: Never  Vaping Use   Vaping Use: Never used  Substance and Sexual Activity   Alcohol use: Not Currently    Comment: socially   Drug use: No   Sexual activity: Yes    Birth control/protection: None  Other Topics Concern   Not on file  Social History Narrative   Lives with mom, sister and brother   Has one daughter born in [birth year] Julia Potter   Wants to go to law school   Social Determinants of Health   Financial Resource Strain: Not on file  Food Insecurity: No Food Insecurity (09/17/2019)   Hunger Vital Sign    Worried About Running Out of Food in the Last Year: Never true    Ran Out of Food in the Last Year: Never true   Transportation Needs: No Transportation Needs (09/17/2019)   PRAPARE - Administrator, Civil Service (Medical): No    Lack of Transportation (Non-Medical): No  Physical Activity: Not on file  Stress: Not on file  Social Connections: Not on file  Intimate Partner Violence: Not on file  Objective:  Physical Exam: BP 124/80 (BP Location: Left Arm, Patient Position: Sitting, Cuff Size: Large)   Pulse 70   Temp 98.4 F (36.9 C) (Temporal)   Ht 5\' 4"  (1.626 m)   Wt 179 lb (81.2 kg)   LMP 08/08/2022   SpO2 99%   BMI 30.73 kg/m     CHEST: Lungs clear to auscultation. CARDIOVASCULAR: Heart sounds normal on auscultation. MUSCULOSKELETAL: Crepitus with knee extension. Knee capsules stable. Valgus strain elicits sensation of instability. SKIN: Skin nodule on right side of chest, non-painful, not changing in size.     No results found.  No results found for this or any previous visit (from the past 2160 hour(s)).      Garner Nash, MD, MS

## 2022-08-08 NOTE — Patient Instructions (Signed)
VISIT SUMMARY:  During your visit, we discussed your left knee pain, which has been worsening over the past month following a fall. We also talked about a skin growth on your right chest that you've been monitoring for over a year. Lastly, we reviewed your well-controlled hypertension.  YOUR PLAN:  -LEFT KNEE PAIN: Your knee pain is likely due to your recent fall. We will order an X-ray and refer you to an Orthopedic specialist for further evaluation. You will also be prescribed Ibuprofen to manage the pain.  For  xray, go to:    [facility] [address] [place], [place], [place] [address] Phone: [phone]   -HYPERTENSION: Your high blood pressure is well controlled. Continue with your current management plan.  -RIGHT CHEST SKIN NODULE: The skin growth on your chest is likely harmless, but it's important to monitor it for any changes in size, shape, or pain. Please report any changes.  INSTRUCTIONS:  Please get your knee X-ray done as soon as possible and schedule an appointment with an Orthopedic specialist. Continue taking your hypertension medication as prescribed. Monitor the skin growth on your chest and report any changes.

## 2022-08-09 DIAGNOSIS — R222 Localized swelling, mass and lump, trunk: Secondary | ICD-10-CM | POA: Insufficient documentation

## 2022-08-09 DIAGNOSIS — G8929 Other chronic pain: Secondary | ICD-10-CM | POA: Insufficient documentation

## 2022-08-09 NOTE — Assessment & Plan Note (Signed)
Hypertension: Well controlled, with current blood pressure at 124/80. -Continue current management.

## 2022-08-09 NOTE — Assessment & Plan Note (Signed)
Left Knee Pain: Pain exacerbated by recent fall, with history of previous fall a year ago. Crepitus noted on examination with some instability on valgus stress. No imaging done previously. -Order knee X-ray. -Refer to Orthopedics for further evaluation and potential MRI. -Prescribe Ibuprofen 600mg  as needed for pain every 6-8 hours.

## 2022-08-09 NOTE — Assessment & Plan Note (Signed)
Right Chest Skin Nodule: Stable, non-painful skin nodule present for over a year. Likely benign (possible lipoma or ingrown hair), but patient has concerns due to personal history of weight loss. -Advise patient to monitor for changes in size, shape, or pain and to report any changes.

## 2022-08-15 ENCOUNTER — Ambulatory Visit (INDEPENDENT_AMBULATORY_CARE_PROVIDER_SITE_OTHER): Payer: Medicaid Other | Admitting: Surgical

## 2022-08-15 ENCOUNTER — Encounter: Payer: Self-pay | Admitting: Surgical

## 2022-08-15 ENCOUNTER — Other Ambulatory Visit (INDEPENDENT_AMBULATORY_CARE_PROVIDER_SITE_OTHER): Payer: Medicaid Other

## 2022-08-15 DIAGNOSIS — M25562 Pain in left knee: Secondary | ICD-10-CM

## 2022-08-15 NOTE — Progress Notes (Signed)
Office Visit Note   Patient: Julia Potter           Date of Birth: August 23, 1990           MRN: [id number] Visit Date: 08/15/2022 Requested by: Garnette Gunner, MD [address] [place],  [place] [address] PCP: Garnette Gunner, MD  Subjective: Chief Complaint  Patient presents with   Left Knee - Pain    HPI: Julia Potter is a 32 y.o. female who presents to the office reporting left knee pain.  Patient states that she fell about 1 year ago and had some intermittent knee pain mostly in the anterior medial aspect of the knee.  However, 1 month ago she was performing body weight squats and felt a popping sensation in her left knee.  Since this event, she has had increased pain and new mechanical popping sensation.  No locking of the joint.  Feels she had swelling pretty much immediately after the incident.  She works from home and enjoys going to [employer] and jogging.  Has not been able to do any jogging.  She is walking with a limp.  Has some posterior pain as well and occasionally has some swelling in the suprapatellar region.  She saw her PCP who recommended conservative measures but these have not helped.  She has tried Tylenol, heating pads, ice, knee brace.  No previous injection or surgery.  No radicular pain or groin pain..                ROS: All systems reviewed are negative as they relate to the chief complaint within the history of present illness.  Patient denies fevers or chills.  Assessment & Plan: Visit Diagnoses:  1. Acute pain of left knee     Plan: Patient is a 32 year old female who presents for evaluation of left knee pain.  Has had longstanding pain for 1 year that acutely worsened 1 month ago while she felt a pop while performing body weight squats.  She is ambulating with a stifflegged gait in the left knee to limit her knee range of motion.  She has medial joint line tenderness and reproducible pain with McMurray's sign.  Plan for further evaluation  with MRI of the left knee to evaluate for medial meniscal tear and evaluate her ACL laxity that is asymmetric compared with the right knee.  Follow-Up Instructions: No follow-ups on file.   Orders:  Orders Placed This Encounter  Procedures   XR KNEE 3 VIEW LEFT   MR Knee Left w/o contrast   No orders of the defined types were placed in this encounter.     Procedures: No procedures performed   Clinical Data: No additional findings.  Objective: Vital Signs: LMP 08/08/2022   Physical Exam:  Constitutional: Patient appears well-developed HEENT:  Head: Normocephalic Eyes:EOM are normal Neck: Normal range of motion Cardiovascular: Normal rate Pulmonary/chest: Effort normal Neurologic: Patient is alert Skin: Skin is warm Psychiatric: Patient has normal mood and affect  Ortho Exam: Ortho exam demonstrates left knee with trace effusion.  No effusion in the right knee.  She has range of motion from -3 degrees hyperextension to about 135 degrees of knee flexion.  Minimal crepitus noted in the left knee compared to right.  There is moderate tenderness over the medial joint line both anterior medially and posterior medially.  Positive McMurray's test reproducing medial joint pain.  Able to perform straight leg raise without extensor lag.  Intact hip flexion,  quadricep, hamstring, dorsiflexion, plantarflexion, EHL rated 5/5.  No calf tenderness.  Negative Homans' sign.  She has ACL laxity with positive endpoint bilaterally though her ACL laxity on Lachman exam seems increased in the left knee compared with the right.  She has difficulty relaxing during ligamentous evaluation and her knee is shaking due to anxiety.  MCL and LCL are stable to varus valgus stress at 0 30 degrees.  Negative posterior drawer sign.  Negative patellar apprehension.  Specialty Comments:  No specialty comments available.  Imaging: No results found.   PMFS History: Patient Active Problem List   Diagnosis Date  Noted   Nodule of skin of right chest 08/09/2022   Chronic pain of left knee 08/09/2022   Severe preeclampsia, delivered 10/12/2019   Anemia, postpartum 10/12/2019   Anemia due to acute blood loss 10/12/2019   Delay postpartum hemorrhage 10/10/2019   Chronic hypertension with superimposed severe preeclampsia 10/09/2019   Chronic hypertension in pregnancy 09/29/2019   Preterm delivery, delivered 09/17/2019   Elevated blood pressure affecting pregnancy in third trimester, antepartum 09/17/2019   Excessive weight gain in pregnancy in third trimester 09/17/2019   Abnormal TSH 05/08/2019   History of preterm delivery 04/25/2019   History of PCOS 04/25/2019   Supervision of high risk pregnancy, antepartum 04/18/2019   History of hypertension 04/18/2019   Obesity 04/18/2019   Obesity in pregnancy 04/18/2019   Rh negative state in antepartum period 04/18/2019   Herpes    History of multiple miscarriages    BMI 40.0-44.9, adult (HCC) 06/12/2017   Hirsutism 09/14/2015   Psychiatric illness 11/01/2013   Past Medical History:  Diagnosis Date   Abnormal uterine bleeding (AUB) 01/20/2016   ALLERGIC RHINITIS    Anxiety    no meds   Chlamydia    Depression    doing ok, in therapy - no meds   Herpes    HSV infection    Pregnancy induced hypertension 2010   resolved after pregnancy   Rh negative state in antepartum period 05/10/2017   [ ] Rhophylac   Seasonal allergies     Family History  Problem Relation Age of Onset   Hypertension Mother    Diabetes Maternal Grandmother    Asthma Daughter     Past Surgical History:  Procedure Laterality Date   DILATION AND EVACUATION N/A 05/23/2017   Procedure: DILATATION AND EVACUATION;  Surgeon: [place] Bing, MD;  Location: [facility];  Service: Gynecology;  Laterality: N/A;  needs [place]   INTRAUTERINE DEVICE INSERTION  09/2010   IUD REMOVAL     Social History   Occupational History   Not on file  Tobacco Use   Smoking status: Never    Smokeless tobacco: Never  Vaping Use   Vaping status: Never Used  Substance and Sexual Activity   Alcohol use: Not Currently    Comment: socially   Drug use: No   Sexual activity: Yes    Birth control/protection: None

## 2022-08-22 ENCOUNTER — Ambulatory Visit
Admission: RE | Admit: 2022-08-22 | Discharge: 2022-08-22 | Disposition: A | Discharge status: Home / Self Care | Payer: Medicaid Other | Source: Ambulatory Visit | Attending: Surgical | Admitting: Surgical

## 2022-08-22 DIAGNOSIS — S83272A Complex tear of lateral meniscus, current injury, left knee, initial encounter: Secondary | ICD-10-CM | POA: Diagnosis not present

## 2022-08-22 DIAGNOSIS — M25562 Pain in left knee: Secondary | ICD-10-CM | POA: Diagnosis not present

## 2022-09-05 ENCOUNTER — Ambulatory Visit (INDEPENDENT_AMBULATORY_CARE_PROVIDER_SITE_OTHER): Payer: Medicaid Other | Admitting: Surgical

## 2022-09-05 DIAGNOSIS — S83512A Sprain of anterior cruciate ligament of left knee, initial encounter: Secondary | ICD-10-CM | POA: Diagnosis not present

## 2022-09-05 DIAGNOSIS — S83272A Complex tear of lateral meniscus, current injury, left knee, initial encounter: Secondary | ICD-10-CM | POA: Diagnosis not present

## 2022-09-06 ENCOUNTER — Encounter: Payer: Self-pay | Admitting: Surgical

## 2022-09-06 NOTE — Progress Notes (Addendum)
Office Visit Note   Patient: Julia Potter           Date of Birth: 09-23-1990           MRN: [id number] Visit Date: 09/05/2022 Requested by: Garnette Gunner, MD [address] [place],  [place] [address] PCP: Garnette Gunner, MD  Subjective: Chief Complaint  Patient presents with   Left Knee - Follow-up    HPI: Julia Potter is a 32 y.o. female who presents to the office for MRI review. Patient denies any changes in symptoms.  Continues to complain mainly of symptomatic instability of the left knee with posterior pain and mechanical symptoms such as clicking and popping.  She feels that the knee is trustworthy for walking but uneven terrain, quick cutting/pivoting, athletic activities such as running are too strenuous for her knee and the knee feels like it wants to give out consistently.  She enjoys working out which has helped her lose a fair amount of weight over the last year.  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  She has at home with her fianc and 2 kids who are 44 years old and 56 years old.  No significant medical history.  She denies any family or personal history of DVT/PE.  Does not take any blood thinners.  She has no stairs to get into her house.  MRI results revealed: MR Knee Left w/o contrast  Result Date: 08/22/2022 CLINICAL DATA:  Left knee pain. EXAM: MRI OF THE LEFT KNEE WITHOUT CONTRAST TECHNIQUE: Multiplanar, multisequence MR imaging of the left was performed. No intravenous contrast was administered. COMPARISON:  Radiographs dated August 15, 2022 FINDINGS: MENISCI Medial: Intact. Lateral: Complex tear of the body/posterior horn of the lateral meniscus. LIGAMENTS Cruciates: ACL and PCL are intact. Collaterals: Medial collateral ligament is intact. Lateral collateral ligament complex is intact. CARTILAGE Patellofemoral:  No chondral defect. Medial:  No chondral defect. Lateral:  No chondral defect. JOINT: No joint effusion. Normal  Hoffa's fat-pad. No plical thickening. POPLITEAL FOSSA: Popliteus tendon is intact. No Baker's cyst. EXTENSOR MECHANISM: Intact quadriceps tendon. Intact patellar tendon. Intact lateral patellar retinaculum. Intact medial patellar retinaculum. Intact MPFL. BONES: No aggressive osseous lesion. No fracture or dislocation. Other: No fluid collection or hematoma. Muscles are normal. IMPRESSION: 1. Complex tear of the body/posterior horn of the lateral meniscus. 2. Cruciate and collateral ligaments are intact. Quadriceps tendon and patellar tendon are also intact. 3. No evidence of fracture or osteonecrosis. Electronically Signed   By: Larose Hires D.O.   On: 08/22/2022 22:50                 ROS: All systems reviewed are negative as they relate to the chief complaint within the history of present illness.  Patient denies fevers or chills.  Assessment & Plan: Visit Diagnoses:  1. Rupture of anterior cruciate ligament of left knee, initial encounter   2. Complex tear of lateral meniscus of left knee as current injury, initial encounter     Plan: Julia Potter is a 32 y.o. female who presents to the office for review of left knee MRI scan.  MRI demonstrates complex tear of the posterior horn of the lateral meniscus.  Also noted on MRI scan is no clear evidence of intact ACL which correlates with her remote history of injury to her knee about a year ago which has resulted in her consistent symptomatic instability.  Also correlates with the significant laxity on  Lachman exam today in the clinic.  We discussed all these findings in great depth with the use of a model.  We also discussed the options able to patient.  For getting back to the activities she wants to do, think that it would be best to proceed with ACL reconstruction with bone patellar tendon bone autograft (her quad tendon feels to be too small based on exam today).  Also will need lateral meniscal debridement versus repair and possible  posterolateral corner reconstruction based on exam under anesthesia after ACL reconstruction.  She has a fair amount of medial pain so we will need to examine the medial compartment extensively as well.  Discussed the risk and benefits of the procedure including the risk of DVT/PE, infection, potential need for revision surgery in case of graft rupture in the future, continued pain, medical complication from surgery.  Extensive nature of rehabilitation discussed as well as the likely need for period of 4 weeks of nonweightbearing.  Follow-up after procedure.  Patient also met Dr. August Saucer today and discussed surgery with him.  Follow-Up Instructions: No follow-ups on file.   Orders:  No orders of the defined types were placed in this encounter.  No orders of the defined types were placed in this encounter.     Procedures: No procedures performed   Clinical Data: No additional findings.  Objective: Vital Signs: LMP 08/08/2022   Physical Exam:  Constitutional: Patient appears well-developed HEENT:  Head: Normocephalic Eyes:EOM are normal Neck: Normal range of motion Cardiovascular: Normal rate Pulmonary/chest: Effort normal Neurologic: Patient is alert Skin: Skin is warm Psychiatric: Patient has normal mood and affect  Ortho Exam: Ortho exam demonstrates left knee with no significant effusion.  She hyperextends to 3 to 5 degrees.  She has full flexion of the left knee.  Tender over the posterior lateral joint line and anterior medial joint line.  Able to perform straight leg raise without extensor lag.  Good quad strength.  No calf tenderness.  Negative Homans' sign.  No pain with hip range of motion.  She has negative posterior drawer sign.  Positive Lachman exam with no significant endpoint.  She has slightly increased laxity to varus stressing at 0 and 30 degrees compared to the contralateral extremity.  No laxity to valgus stressing at 0 or 30 degrees.  She does have some increased  posterolateral rotational instability on exam compared with the contralateral extremity.  No cellulitis or skin changes noted.  Specialty Comments:  No specialty comments available.  Imaging: No results found.   PMFS History: Patient Active Problem List   Diagnosis Date Noted   Nodule of skin of right chest 08/09/2022   Chronic pain of left knee 08/09/2022   Severe preeclampsia, delivered 10/12/2019   Anemia, postpartum 10/12/2019   Anemia due to acute blood loss 10/12/2019   Delay postpartum hemorrhage 10/10/2019   Chronic hypertension with superimposed severe preeclampsia 10/09/2019   Chronic hypertension in pregnancy 09/29/2019   Preterm delivery, delivered 09/17/2019   Elevated blood pressure affecting pregnancy in third trimester, antepartum 09/17/2019   Excessive weight gain in pregnancy in third trimester 09/17/2019   Abnormal TSH 05/08/2019   History of preterm delivery 04/25/2019   History of PCOS 04/25/2019   Supervision of high risk pregnancy, antepartum 04/18/2019   History of hypertension 04/18/2019   Obesity 04/18/2019   Obesity in pregnancy 04/18/2019   Rh negative state in antepartum period 04/18/2019   Herpes    History of multiple miscarriages  BMI 40.0-44.9, adult (HCC) 06/12/2017   Hirsutism 09/14/2015   Psychiatric illness 11/01/2013   Past Medical History:  Diagnosis Date   Abnormal uterine bleeding (AUB) 01/20/2016   ALLERGIC RHINITIS    Anxiety    no meds   Chlamydia    Depression    doing ok, in therapy - no meds   Herpes    HSV infection    Pregnancy induced hypertension 2010   resolved after pregnancy   Rh negative state in antepartum period 05/10/2017   [ ] Rhophylac   Seasonal allergies     Family History  Problem Relation Age of Onset   Hypertension Mother    Diabetes Maternal Grandmother    Asthma Daughter     Past Surgical History:  Procedure Laterality Date   DILATION AND EVACUATION N/A 05/23/2017   Procedure: DILATATION  AND EVACUATION;  Surgeon: [place] Bing, MD;  Location: [facility];  Service: Gynecology;  Laterality: N/A;  needs [place]   INTRAUTERINE DEVICE INSERTION  09/2010   IUD REMOVAL     Social History   Occupational History   Not on file  Tobacco Use   Smoking status: Never   Smokeless tobacco: Never  Vaping Use   Vaping status: Never Used  Substance and Sexual Activity   Alcohol use: Not Currently    Comment: socially   Drug use: No   Sexual activity: Yes    Birth control/protection: None   Patient seen and examined.  Patient describes injury a year ago with repeat injury several months ago.  Does report symptomatic instability in the left knee along with locking.  No personal or family history of DVT or pulmonary embolism.  On examination she does have increased medial lateral laxity with extension and flexion on the left knee compared to the right more on the lateral side.  There may also be posterolateral rotatory instability on the left compared to the right.  Better examination could be performed with patient monitoring this lesion.  ACL laxity also present with about 4 mm anterior translation on Lachman exam left with soft endpoint compared to 1-2 on the right with firm endpoint.  Plan at this time is examination under the anesthesia.  We discussed graft options and patient would prefer to use autograft tissue.  Bone patella tendon bone would be a good option for her.  May also require allograft posterolateral corner reconstruction fibular based.  Would likely have at least partial exposure for meniscal repair to facilitate that procedure.  Even though the LCL is structurally intact on the MRI scan does feel lax compared to the right-hand side.  Extensive nature of the procedures discussed.  Risk and benefits are discussed.  Time out of work and rehabilitation time required also discussed.  All questions answered.  S dean

## 2022-09-12 ENCOUNTER — Ambulatory Visit: Payer: Medicaid Other | Admitting: Surgical

## 2022-10-09 ENCOUNTER — Inpatient Hospital Stay (HOSPITAL_COMMUNITY): Payer: Medicaid Other

## 2022-10-09 ENCOUNTER — Other Ambulatory Visit: Payer: Self-pay

## 2022-10-09 ENCOUNTER — Encounter (HOSPITAL_COMMUNITY): Payer: Self-pay | Admitting: *Deleted

## 2022-10-09 ENCOUNTER — Inpatient Hospital Stay (HOSPITAL_COMMUNITY)
Admission: AD | Admit: 2022-10-09 | Discharge: 2022-10-09 | Disposition: A | Discharge status: Home / Self Care | Payer: Medicaid Other | Attending: Obstetrics & Gynecology | Admitting: Obstetrics & Gynecology

## 2022-10-09 DIAGNOSIS — Z3A01 Less than 8 weeks gestation of pregnancy: Secondary | ICD-10-CM | POA: Insufficient documentation

## 2022-10-09 DIAGNOSIS — O26891 Other specified pregnancy related conditions, first trimester: Secondary | ICD-10-CM | POA: Insufficient documentation

## 2022-10-09 DIAGNOSIS — O3680X0 Pregnancy with inconclusive fetal viability, not applicable or unspecified: Secondary | ICD-10-CM | POA: Insufficient documentation

## 2022-10-09 DIAGNOSIS — R1030 Lower abdominal pain, unspecified: Secondary | ICD-10-CM | POA: Diagnosis not present

## 2022-10-09 DIAGNOSIS — Z3201 Encounter for pregnancy test, result positive: Secondary | ICD-10-CM | POA: Diagnosis not present

## 2022-10-09 DIAGNOSIS — O26899 Other specified pregnancy related conditions, unspecified trimester: Secondary | ICD-10-CM

## 2022-10-09 LAB — URINALYSIS, ROUTINE W REFLEX MICROSCOPIC
Bilirubin Urine: NEGATIVE
Glucose, UA: NEGATIVE mg/dL
Hgb urine dipstick: NEGATIVE
Ketones, ur: NEGATIVE mg/dL
Leukocytes,Ua: NEGATIVE
Nitrite: NEGATIVE
Protein, ur: NEGATIVE mg/dL
Specific Gravity, Urine: 1.018 (ref 1.005–1.030)
pH: 5 (ref 5.0–8.0)

## 2022-10-09 LAB — CBC
HCT: 38.6 % (ref 36.0–46.0)
Hemoglobin: 13.2 g/dL (ref 12.0–15.0)
MCH: 32 pg (ref 26.0–34.0)
MCHC: 34.2 g/dL (ref 30.0–36.0)
MCV: 93.5 fL (ref 80.0–100.0)
Platelets: 349 10*3/uL (ref 150–400)
RBC: 4.13 MIL/uL (ref 3.87–5.11)
RDW: 12.7 % (ref 11.5–15.5)
WBC: 6.3 10*3/uL (ref 4.0–10.5)
nRBC: 0 % (ref 0.0–0.2)

## 2022-10-09 LAB — HCG, QUANTITATIVE, PREGNANCY: hCG, Beta Chain, Quant, S: 24583 m[IU]/mL — ABNORMAL HIGH (ref <5)

## 2022-10-09 LAB — WET PREP, GENITAL
Clue Cells Wet Prep HPF POC: NONE SEEN
Sperm: NONE SEEN
Trich, Wet Prep: NONE SEEN
WBC, Wet Prep HPF POC: >=10 — AB (ref <10)
Yeast Wet Prep HPF POC: NONE SEEN

## 2022-10-09 LAB — POCT PREGNANCY, URINE: Preg Test, Ur: POSITIVE — AB

## 2022-10-09 NOTE — MAU Note (Signed)
.  Julia Potter is a 32 y.o. at Unknown here in MAU reporting: intermittent sharp lower abdominal pain that began last week.  Reports +HPT.  Denies VB. LMP: 09/01/2022 Onset of complaint: last week Pain score: 7 Vitals:   10/09/22 0830  BP: 128/77  Pulse: 91  Resp: 18  Temp: 98 F (36.7 C)  SpO2: 100%     FHT:NA Lab orders placed from triage:   UPT & UA

## 2022-10-09 NOTE — MAU Provider Note (Signed)
History     CSN: [id number]  Arrival date and time: 10/09/22 0807   Event Date/Time   First Provider Initiated Contact with Patient 10/09/22 [phone]      Chief Complaint  Patient presents with   Abdominal Pain   HPI Julia Potter is a 32 y.o. year old [id number] female at [redacted]w[redacted]d weeks gestation by LMP of 09/01/2022 who presents to MAU reporting sharp, intermittent lower abdominal pain since last week. She reports (+) HPT. She states, "I don't feel pregnant. I usually am throwing up and my boobs are really sore by the time I miss my period. So, I don't know what to think." She reports she injured her LT ACL/MCL and is scheduled to have LT arthroscopic surgery on 10/27/2022.  OB History     Gravida  6   Para  2   Term  0   Preterm  2   AB  3   Living  2      SAB  3   IAB  0   Ectopic  0   Multiple  0   Live Births  2        Obstetric Comments  [id number] Admitted to [facility] for PIH, IOL. Delivered a viable female at 44 weeks         Past Medical History:  Diagnosis Date   Abnormal uterine bleeding (AUB) 01/20/2016   ALLERGIC RHINITIS    Anxiety    no meds   Chlamydia    Depression    doing ok, in therapy - no meds   Herpes    HSV infection    Pregnancy induced hypertension 2010   resolved after pregnancy   Rh negative state in antepartum period 05/10/2017   [ ] Rhophylac   Seasonal allergies     Past Surgical History:  Procedure Laterality Date   DILATION AND EVACUATION N/A 05/23/2017   Procedure: DILATATION AND EVACUATION;  Surgeon: [place] Bing, MD;  Location: [facility];  Service: Gynecology;  Laterality: N/A;  needs [place]   INTRAUTERINE DEVICE INSERTION  09/2010   IUD REMOVAL      Family History  Problem Relation Age of Onset   Hypertension Mother    Diabetes Maternal Grandmother    Asthma Daughter     Social History   Tobacco Use   Smoking status: Never   Smokeless tobacco: Never  Vaping Use   Vaping status: Never Used  Substance Use  Topics   Alcohol use: Not Currently    Comment: socially   Drug use: No    Allergies: No Known Allergies  Medications Prior to Admission  Medication Sig Dispense Refill Last Dose   acetaminophen (TYLENOL) 500 MG tablet Take 1,000 mg by mouth every 6 (six) hours as needed for moderate pain.      diphenhydrAMINE (BENADRYL) 25 MG tablet Take 25 mg by mouth daily as needed for allergies.      ibuprofen (ADVIL) 600 MG tablet Take 1 tablet (600 mg total) by mouth every 8 (eight) hours as needed. 30 tablet 0    LIDOCAINE & MENTHOL EX Apply 1 patch topically daily as needed (pain).      valACYclovir (VALTREX) 500 MG tablet Take 1 tablet (500 mg) 2 times daily for 3 days for symptoms of outbreak (Patient taking differently: Take 1 tablet (500 mg) 2 times daily for 3 days as needed for symptoms of outbreak) 6 tablet PRN     Review of Systems  Constitutional: Negative.  HENT: Negative.    Eyes: Negative.   Respiratory: Negative.    Cardiovascular: Negative.   Gastrointestinal: Negative.   Endocrine: Negative.   Genitourinary:  Positive for pelvic pain (lower, sharp and intermittent since last week).  Musculoskeletal: Negative.   Skin: Negative.   Allergic/Immunologic: Negative.   Neurological: Negative.   Hematological: Negative.   Psychiatric/Behavioral: Negative.     Physical Exam   Blood pressure 128/77, pulse 91, temperature 98 F (36.7 C), temperature source Oral, resp. rate 18, height 5\' 3"  (1.6 m), weight 82.7 kg, last menstrual period 09/01/2022, SpO2 100%, unknown if currently breastfeeding.  Physical Exam Vitals and nursing note reviewed.  Constitutional:      Appearance: Normal appearance. She is normal weight.  Cardiovascular:     Rate and Rhythm: Normal rate.  Pulmonary:     Effort: Pulmonary effort is normal.  Musculoskeletal:        General: Normal range of motion.  Skin:    General: Skin is warm and dry.  Neurological:     Mental Status: She is alert and  oriented to person, place, and time.  Psychiatric:        Mood and Affect: Mood normal.        Behavior: Behavior normal.        Thought Content: Thought content normal.        Judgment: Judgment normal.    MAU Course  Procedures  MDM I have reviewed the triage vital signs and the nursing notes.   Pertinent labs & imaging results that were available during my care of the patient were reviewed by me and considered in my medical decision making (see chart for details). I have reviewed her medical records including past results, notes and treatments. Medical, Surgical, and family history were reviewed.  Medications and recent lab tests were reviewed   CCUA UPT CBC ABO/Rh -- not drawn; known A NEG HCG Wet Prep GC/CT -- pending HIV -- pending OB < 14 wks [place] with TV  Results for orders placed or performed during the hospital encounter of 10/09/22 (from the past 24 hour(s))  Pregnancy, urine POC     Status: Abnormal   Collection Time: 10/09/22  8:26 AM  Result Value Ref Range   Preg Test, Ur POSITIVE (A) NEGATIVE  Urinalysis, Routine w reflex microscopic -Urine, Clean Catch     Status: Abnormal   Collection Time: 10/09/22  8:27 AM  Result Value Ref Range   Color, Urine YELLOW YELLOW   APPearance CLOUDY (A) CLEAR   Specific Gravity, Urine 1.018 1.005 - 1.030   pH 5.0 5.0 - 8.0   Glucose, UA NEGATIVE NEGATIVE mg/dL   Hgb urine dipstick NEGATIVE NEGATIVE   Bilirubin Urine NEGATIVE NEGATIVE   Ketones, ur NEGATIVE NEGATIVE mg/dL   Protein, ur NEGATIVE NEGATIVE mg/dL   Nitrite NEGATIVE NEGATIVE   Leukocytes,Ua NEGATIVE NEGATIVE  Wet prep, genital     Status: Abnormal   Collection Time: 10/09/22  9:08 AM  Result Value Ref Range   Yeast Wet Prep HPF POC NONE SEEN NONE SEEN   Trich, Wet Prep NONE SEEN NONE SEEN   Clue Cells Wet Prep HPF POC NONE SEEN NONE SEEN   WBC, Wet Prep HPF POC >=10 (A) <10   Sperm NONE SEEN   CBC     Status: None   Collection Time: 10/09/22  9:36 AM   Result Value Ref Range   WBC 6.3 4.0 - 10.5 K/uL   RBC 4.13 3.87 -  5.11 MIL/uL   Hemoglobin 13.2 12.0 - 15.0 g/dL   HCT 40.9 81.1 - 91.4 %   MCV 93.5 80.0 - 100.0 fL   MCH 32.0 26.0 - 34.0 pg   MCHC 34.2 30.0 - 36.0 g/dL   RDW 78.2 95.6 - 21.3 %   Platelets 349 150 - 400 K/uL   nRBC 0.0 0.0 - 0.2 %    US OB LESS THAN 14 WEEKS WITH OB TRANSVAGINAL  Result Date: 10/09/2022 CLINICAL DATA:  First trimester pregnancy. Sharp lower abdominal pain. Estimated gestational age by LMP is 5 weeks 3 days. EXAM: OBSTETRIC <14 WK [place] AND TRANSVAGINAL OB US TECHNIQUE: Both transabdominal and transvaginal ultrasound examinations were performed for complete evaluation of the gestation as well as the maternal uterus, adnexal regions, and pelvic cul-de-sac. Transvaginal technique was performed to assess early pregnancy. COMPARISON:  None for this pregnancy. FINDINGS: Intrauterine gestational sac: Single Yolk sac:  Present Embryo:  Not visualized Cardiac Activity: Not visualized MSD: 10.5 mm   5 w   5 d Subchorionic hemorrhage:  None visualized. Maternal uterus/adnexae: Luteal cyst is noted within the right ovary. The uterus and adnexa are otherwise within limits. IMPRESSION: 1. Single intrauterine gestational sac with mean sac diameter corresponding with a gestational age of [redacted] weeks and 5 days. No fetal pole is demonstrated. A fetal pole is not necessarily visualized this early in pregnancy, and the findings may be due to early gestation. A blighted ovum is not excluded. Follow-up beta HCG levels and consideration of follow-up ultrasound in 7-10 days are recommended to assess fetal viability. Electronically Signed   By: Marin Roberts M.D.   On: 10/09/2022 10:31    Assessment and Plan  1. Pregnancy with uncertain fetal viability, single or unspecified fetus - Needs f/u u/s in 2 weeks  2. Abdominal pain affecting pregnancy - Information provided on abdominal pain in pregnancy   3. [redacted] weeks gestation of  pregnancy   - Discharge patient - F/U U/S scheduled 09/23/2022 @ 0845 per pt request. Message sent to [facility] to have patient scheduled to see provider immediately after U/S to discuss results. - Patient verbalized an understanding of the plan of care and agrees.    Raelyn Mora, CNM 10/09/2022, 9:25 AM

## 2022-10-10 LAB — GC/CHLAMYDIA PROBE AMP (~~LOC~~) NOT AT ARMC
Chlamydia: NEGATIVE
Comment: NEGATIVE
Comment: NORMAL
Neisseria Gonorrhea: NEGATIVE

## 2022-10-11 ENCOUNTER — Telehealth: Payer: Self-pay | Admitting: Orthopedic Surgery

## 2022-10-11 NOTE — Telephone Encounter (Signed)
Patient is scheduled at [facility] 10-27-22 for left knee arthroscopy with ACL reconstruction with bone-tibia-bone autograft, lateral meniscal repair vs debridement, possible posterolateral corner reconstruction.  She has just found out she is 5 weeks and 6 days pregnant.  She is asking if this surgery will go forward or will need to be put off until after pregnancy.    Please advise ASAP.  [phone].

## 2022-10-12 NOTE — Telephone Encounter (Signed)
Delay thx

## 2022-10-17 ENCOUNTER — Inpatient Hospital Stay (HOSPITAL_COMMUNITY): Admission: RE | Admit: 2022-10-17 | Payer: Medicaid Other | Source: Ambulatory Visit

## 2022-10-23 ENCOUNTER — Other Ambulatory Visit: Payer: Self-pay

## 2022-10-23 DIAGNOSIS — O3680X0 Pregnancy with inconclusive fetal viability, not applicable or unspecified: Secondary | ICD-10-CM

## 2022-10-24 ENCOUNTER — Other Ambulatory Visit: Payer: Self-pay

## 2022-10-24 ENCOUNTER — Ambulatory Visit (INDEPENDENT_AMBULATORY_CARE_PROVIDER_SITE_OTHER): Payer: Medicaid Other

## 2022-10-24 ENCOUNTER — Ambulatory Visit (INDEPENDENT_AMBULATORY_CARE_PROVIDER_SITE_OTHER): Payer: Medicaid Other | Admitting: Obstetrics and Gynecology

## 2022-10-24 VITALS — BP 145/77 | HR 90 | Ht 63.0 in | Wt 175.0 lb

## 2022-10-24 DIAGNOSIS — O3680X0 Pregnancy with inconclusive fetal viability, not applicable or unspecified: Secondary | ICD-10-CM

## 2022-10-24 DIAGNOSIS — O365920 Maternal care for other known or suspected poor fetal growth, second trimester, not applicable or unspecified: Secondary | ICD-10-CM | POA: Insufficient documentation

## 2022-10-24 DIAGNOSIS — O283 Abnormal ultrasonic finding on antenatal screening of mother: Secondary | ICD-10-CM

## 2022-10-24 DIAGNOSIS — O10919 Unspecified pre-existing hypertension complicating pregnancy, unspecified trimester: Secondary | ICD-10-CM

## 2022-10-24 DIAGNOSIS — Z3A01 Less than 8 weeks gestation of pregnancy: Secondary | ICD-10-CM

## 2022-10-24 DIAGNOSIS — Z3491 Encounter for supervision of normal pregnancy, unspecified, first trimester: Secondary | ICD-10-CM

## 2022-10-24 DIAGNOSIS — O219 Vomiting of pregnancy, unspecified: Secondary | ICD-10-CM | POA: Insufficient documentation

## 2022-10-24 DIAGNOSIS — O10911 Unspecified pre-existing hypertension complicating pregnancy, first trimester: Secondary | ICD-10-CM | POA: Diagnosis not present

## 2022-10-24 MED ORDER — SIMETHICONE 80 MG PO CHEW: 80.0000 mg | CHEWABLE_TABLET | Freq: Four times a day (QID) | ORAL | 0 refills | Status: DC | PRN

## 2022-10-24 MED ORDER — DOXYLAMINE-PYRIDOXINE 10-10 MG PO TBEC: 1.0000 | DELAYED_RELEASE_TABLET | Freq: Every day | ORAL | 1 refills | Status: DC

## 2022-10-24 MED ORDER — FAMOTIDINE 20 MG PO TABS: 20.0000 mg | ORAL_TABLET | Freq: Two times a day (BID) | ORAL | 3 refills | Status: DC

## 2022-10-24 MED ORDER — CVS PRENATAL GUMMY 0.4 MG PO CHEW: 1.0000 [IU] | CHEWABLE_TABLET | Freq: Every day | ORAL | 1 refills | Status: AC

## 2022-10-24 NOTE — Addendum Note (Signed)
Addended by: Cinda Quest A on: 10/24/2022 01:38 PM   Modules accepted: Orders

## 2022-10-24 NOTE — Progress Notes (Signed)
Obstetrics and Gynecology Visit Return Patient Evaluation  Appointment Date: 10/24/2022  Primary Care Provider: Garnette Gunner  OBGYN Clinic: [facility]  Chief Complaint: viability u/s  History of Present Illness:  Julia Potter is a 32 y.o. [phone] at 7/4 (LMP=u/s today), [facility] 5/8.  Patient went to MAU on 9/8 for abdominal pain and dx with IUP and set up for viability u/s today. U/s today shows SLIUP with normal FHR. Patient with morning sickness s/s and requesting medications. No SAB s/s.   Review of Systems: as noted in the History of Present Illness.  Patient Active Problem List   Diagnosis Date Noted   Abnormal fetal ultrasound 10/24/2022   Nausea and vomiting in pregnancy 10/24/2022   Nodule of skin of right chest 08/09/2022   Chronic pain of left knee 08/09/2022   Severe preeclampsia, delivered 10/12/2019   Anemia, postpartum 10/12/2019   Anemia due to acute blood loss 10/12/2019   Delay postpartum hemorrhage 10/10/2019   Chronic hypertension with superimposed severe preeclampsia 10/09/2019   Chronic hypertension in pregnancy 09/29/2019   Preterm delivery, delivered 09/17/2019   Elevated blood pressure affecting pregnancy in third trimester, antepartum 09/17/2019   Excessive weight gain in pregnancy in third trimester 09/17/2019   Abnormal TSH 05/08/2019   History of preterm delivery 04/25/2019   History of PCOS 04/25/2019   Supervision of high risk pregnancy, antepartum 04/18/2019   History of hypertension 04/18/2019   Obesity 04/18/2019   Obesity in pregnancy 04/18/2019   Rh negative state in antepartum period 04/18/2019   Herpes    History of multiple miscarriages    BMI 40.0-44.9, adult (HCC) 06/12/2017   Pregnancy with uncertain fetal viability 05/10/2017   Hirsutism 09/14/2015   Psychiatric illness 11/01/2013   Medications: None   Allergies: has No Known Allergies.  Physical Exam:  BP (!) 145/77   Pulse 90    Ht 5\' 3"  (1.6 m)   Wt 175 lb (79.4 kg)   LMP 09/01/2022   BMI 31.00 kg/m  Body mass index is 31 kg/m. General appearance: Well nourished, well developed female in no acute distress.  Neuro/Psych:  Normal mood and affect.    Radiology: SLIUP at 7/6 weeks by CRL, FHR 159. On my review of images, ?fetal cyst seen in the cranial area, YS seen separate from this. RO with small CL cyst seen  Assessment: patient stable  Plan:  1. Pregnancy with uncertain fetal viability, single or unspecified fetus Viability confirmed today - [place] MFM OB LIMITED; Future  2. Chronic hypertension in pregnancy Follow up at next visit - [place] MFM OB LIMITED; Future  3. Nausea and vomiting in pregnancy Pepcid, gas-x, prenatal gummies sent in. RN to send in diclegis  4. Abnormal fetal ultrasound Unsure if artifact or true finding. Will set up for repeat [facility] u/s in 4 weeks and reer to MFM PRN. Recommend panorama next visit   Return in about 1 month (around 11/23/2022) for needs new OB visit sometime after mfm u/s.  Future Appointments  Date Time Provider Department Center  10/24/2022 10:35 AM [place] Bing, MD [facility]  11/07/2022  1:30 PM Magnant, Joycie Peek, PA-C [facility] None    Cornelia Copa MD Attending Center for [facility])

## 2022-10-27 ENCOUNTER — Ambulatory Visit (HOSPITAL_COMMUNITY): Admission: RE | Admit: 2022-10-27 | Payer: Medicaid Other | Source: Home / Self Care | Admitting: Orthopedic Surgery

## 2022-10-27 DIAGNOSIS — Z01818 Encounter for other preprocedural examination: Secondary | ICD-10-CM

## 2022-10-27 SURGERY — RECONSTRUCTION, KNEE, ACL, USING HAMSTRING GRAFT
Anesthesia: General | Site: Knee | Laterality: Left

## 2022-11-02 ENCOUNTER — Ambulatory Visit: Payer: Medicaid Other | Admitting: Obstetrics and Gynecology

## 2022-11-04 ENCOUNTER — Encounter: Payer: Medicaid Other | Admitting: Surgical

## 2022-11-07 ENCOUNTER — Encounter: Payer: Medicaid Other | Admitting: Surgical

## 2022-11-21 ENCOUNTER — Other Ambulatory Visit: Payer: Self-pay

## 2022-11-21 ENCOUNTER — Ambulatory Visit (INDEPENDENT_AMBULATORY_CARE_PROVIDER_SITE_OTHER): Payer: Medicaid Other

## 2022-11-21 ENCOUNTER — Ambulatory Visit (INDEPENDENT_AMBULATORY_CARE_PROVIDER_SITE_OTHER): Payer: Medicaid Other | Admitting: Family Medicine

## 2022-11-21 VITALS — BP 146/86 | HR 97 | Wt 177.0 lb

## 2022-11-21 DIAGNOSIS — O0990 Supervision of high risk pregnancy, unspecified, unspecified trimester: Secondary | ICD-10-CM

## 2022-11-21 DIAGNOSIS — Z3A11 11 weeks gestation of pregnancy: Secondary | ICD-10-CM | POA: Diagnosis not present

## 2022-11-21 DIAGNOSIS — O283 Abnormal ultrasonic finding on antenatal screening of mother: Secondary | ICD-10-CM

## 2022-11-21 DIAGNOSIS — Z3A00 Weeks of gestation of pregnancy not specified: Secondary | ICD-10-CM | POA: Diagnosis not present

## 2022-11-21 NOTE — Progress Notes (Addendum)
   Amenorrhea with positive UPT PROBLEM  VISIT ENCOUNTER NOTE  Subjective:   Julia Potter is a 32 y.o. [phone]  female here for positive pregnancy test.  She has not had serial bhcg. She has had an Korea. Her last Korea was 10/24/22.  9/23- confirmed viability Provider at this [place] wanted MFM Korea for possible fetal head abnormality Discussed with patient that [place] today showed persistent anomaly Also plans to get prenatal care here and has not had new OB intake or labs.    Denies abnormal vaginal bleeding, discharge, pelvic pain, problems with intercourse or other gynecologic concerns.    Gynecologic History Patient's last menstrual period was 09/01/2022.  Health Maintenance Due  Topic Date Due   Cervical Cancer Screening (HPV/Pap Cotest)  08/13/2022   INFLUENZA VACCINE  Never done   COVID-19 Vaccine (1 - 2023-24 season) Never done    The following portions of the patient's history were reviewed and updated as appropriate: allergies, current medications, past family history, past medical history, past social history, past surgical history and problem list.  Review of Systems Pertinent items are noted in HPI.   Objective:  BP (!) 146/86   Pulse 97   Wt 177 lb (80.3 kg)   LMP 09/01/2022   BMI 31.35 kg/m  Gen: well appearing, NAD HEENT: no scleral icterus CV: RR Lung: Normal WOB Ext: warm well perfused   Assessment and Plan:  1. Abnormal fetal ultrasound There is fetal head abnormality, cystic structure of fetal head seen last Korea. Unsure of full resolution. Was intended to get MFM [place] today but has repeat viability.  Needs MFM [place] with early anatomy scan Ordered New OB panel and NIP today Will need new OB intake ASAP, plans to get care here at [facility]  - [place] MFM OB COMPLETE LESS THAN 14 WEEKS; Future - [place] MFM OB COMP + 14 WK; Future  2. Supervision of high risk pregnancy, antepartum Ordered new OB labs to also get panorama given fetal anomaly on Korea -  CBC/D/Plt+RPR+Rh+ABO+RubIgG... - Hemoglobin A1c - PANORAMA PRENATAL TEST   Please refer to After Visit Summary for other counseling recommendations.   Return in about 2 weeks (around 12/05/2022) for New OB visit.  Future Appointments  Date Time Provider Department Center  11/30/2022  1:15 PM [facility] OB INTAKE [facility]  12/05/2022  9:15 AM Federico Flake, MD [facility]  12/13/2022  7:15 AM [facility] NURSE [facility]  12/13/2022  7:30 AM [facility] US4 [facility]     Federico Flake, MD, MPH, ABFM Attending Physician Faculty Practice- Center for [facility]

## 2022-11-25 LAB — CBC/D/PLT+RPR+RH+ABO+RUBIGG...
Antibody Screen: NEGATIVE
Basophils Absolute: 0 10*3/uL (ref 0.0–0.2)
Basos: 0 %
EOS (ABSOLUTE): 0 10*3/uL (ref 0.0–0.4)
Eos: 0 %
HCV Ab: NONREACTIVE
HIV Screen 4th Generation wRfx: NONREACTIVE
Hematocrit: 35.8 % (ref 34.0–46.6)
Hemoglobin: 11.6 g/dL (ref 11.1–15.9)
Hepatitis B Surface Ag: NEGATIVE
Immature Grans (Abs): 0 10*3/uL (ref 0.0–0.1)
Immature Granulocytes: 0 %
Lymphocytes Absolute: 2.1 10*3/uL (ref 0.7–3.1)
Lymphs: 29 %
MCH: 30.9 pg (ref 26.6–33.0)
MCHC: 32.4 g/dL (ref 31.5–35.7)
MCV: 96 fL (ref 79–97)
Monocytes Absolute: 0.5 10*3/uL (ref 0.1–0.9)
Monocytes: 7 %
Neutrophils Absolute: 4.6 10*3/uL (ref 1.4–7.0)
Neutrophils: 64 %
Platelets: 416 10*3/uL (ref 150–450)
RBC: 3.75 x10E6/uL — ABNORMAL LOW (ref 3.77–5.28)
RDW: 12.8 % (ref 11.7–15.4)
RPR Ser Ql: NONREACTIVE
Rh Factor: NEGATIVE
Rubella Antibodies, IGG: 1.75 {index} (ref >0.99)
WBC: 7.3 10*3/uL (ref 3.4–10.8)

## 2022-11-25 LAB — HCV INTERPRETATION

## 2022-11-25 LAB — HEMOGLOBIN A1C
Est. average glucose Bld gHb Est-mCnc: 100 mg/dL
Hgb A1c MFr Bld: 5.1 % (ref 4.8–5.6)

## 2022-11-26 LAB — PANORAMA PRENATAL TEST FULL PANEL:PANORAMA TEST PLUS 5 ADDITIONAL MICRODELETIONS: FETAL FRACTION: 9.5

## 2022-11-27 ENCOUNTER — Encounter: Payer: Self-pay | Admitting: Family Medicine

## 2022-11-27 DIAGNOSIS — O0990 Supervision of high risk pregnancy, unspecified, unspecified trimester: Secondary | ICD-10-CM | POA: Insufficient documentation

## 2022-11-28 ENCOUNTER — Other Ambulatory Visit: Payer: PRIVATE HEALTH INSURANCE

## 2022-11-30 ENCOUNTER — Telehealth (INDEPENDENT_AMBULATORY_CARE_PROVIDER_SITE_OTHER): Payer: Medicaid Other

## 2022-11-30 DIAGNOSIS — O0990 Supervision of high risk pregnancy, unspecified, unspecified trimester: Secondary | ICD-10-CM

## 2022-11-30 DIAGNOSIS — Z3689 Encounter for other specified antenatal screening: Secondary | ICD-10-CM

## 2022-11-30 NOTE — Progress Notes (Signed)
New OB Intake  I connected with Julia Potter  on 11/30/22 at  1:15 PM EDT by MyChart Video Visit and verified that I am speaking with the correct person using two identifiers. Nurse is located at [facility] and pt is located at home.  I discussed the limitations, risks, security and privacy concerns of performing an evaluation and management service by telephone and the availability of in person appointments. I also discussed with the patient that there may be a patient responsible charge related to this service. The patient expressed understanding and agreed to proceed.  I explained I am completing New OB Intake today. We discussed EDD of 06/08/2023, by Last Menstrual Period. Pt is [id number]. I reviewed her allergies, medications and Medical/Surgical/OB history.    Patient Active Problem List   Diagnosis Date Noted   Supervision of high risk pregnancy, antepartum 11/27/2022   Abnormal fetal ultrasound 10/24/2022   Severe preeclampsia, delivered 10/12/2019   Delay postpartum hemorrhage 10/10/2019   Abnormal TSH 05/08/2019   History of preterm delivery 04/25/2019   History of PCOS 04/25/2019   History of hypertension 04/18/2019   Obesity affecting pregnancy 04/18/2019   Rh negative state in antepartum period 04/18/2019   Herpes    History of multiple miscarriages    Hirsutism 09/14/2015   Psychiatric illness 11/01/2013    Concerns addressed today  Delivery Plans Plans to deliver at [facility]. Discussed the nature of our practice with multiple providers including residents and students. Due to the size of the practice, the delivering provider may not be the same as those providing prenatal care.   Patient is not interested in water birth. Offered upcoming OB visit with CNM to discuss further.  MyChart/Babyscripts MyChart access verified. I explained pt will have some visits in office and some virtually. Babyscripts instructions given and order placed. Patient verifies receipt of  registration text/e-mail. Account successfully created and app downloaded.  Blood Pressure Cuff/Weight Scale Pt has own BP Cuff, Explained after first prenatal appt pt will check weekly and document in Babyscripts. Patient does have weight scale.  Anatomy US Explained first scheduled Korea will be around 19 weeks. Anatomy US scheduled for 12/13/22 at 0730a.  Is patient a CenteringPregnancy candidate?    Not a candidate due to  2 preterm deliv/preeclampsia/ghtn If accepted,    Is patient a Mom+Baby Combined Care candidate?  Not a candidate   If accepted, confirm patient does not intend to move from the area for at least 12 months, then notify Mom+Baby staff  Interested in [place]? If yes, send referral and doula dot phrase.   Is patient a candidate for Babyscripts Optimization? No -    First visit review I reviewed new OB appt with patient. Explained pt will be seen by Dr.Newton at first visit. Discussed Julia Potter genetic screening with patient. Done Panorama and Horizon.. Routine prenatal labs  collected on 11/21/22    Last Pap Diagnosis  Date Value Ref Range Status  04/25/2019   Final   - Negative for intraepithelial lesion or malignancy (NILM)    Henrietta Dine, CMA 11/30/2022  1:43 PM

## 2022-12-05 ENCOUNTER — Other Ambulatory Visit (HOSPITAL_COMMUNITY)
Admission: RE | Admit: 2022-12-05 | Discharge: 2022-12-05 | Disposition: A | Discharge status: Home / Self Care | Payer: Medicaid Other | Source: Ambulatory Visit | Attending: Family Medicine | Admitting: Family Medicine

## 2022-12-05 ENCOUNTER — Other Ambulatory Visit: Payer: Self-pay

## 2022-12-05 ENCOUNTER — Ambulatory Visit (INDEPENDENT_AMBULATORY_CARE_PROVIDER_SITE_OTHER): Payer: Medicaid Other | Admitting: Family Medicine

## 2022-12-05 ENCOUNTER — Encounter: Payer: Self-pay | Admitting: Family Medicine

## 2022-12-05 VITALS — BP 132/87 | HR 84 | Wt 179.0 lb

## 2022-12-05 DIAGNOSIS — Z6791 Unspecified blood type, Rh negative: Secondary | ICD-10-CM

## 2022-12-05 DIAGNOSIS — O283 Abnormal ultrasonic finding on antenatal screening of mother: Secondary | ICD-10-CM

## 2022-12-05 DIAGNOSIS — O26899 Other specified pregnancy related conditions, unspecified trimester: Secondary | ICD-10-CM

## 2022-12-05 DIAGNOSIS — J302 Other seasonal allergic rhinitis: Secondary | ICD-10-CM | POA: Insufficient documentation

## 2022-12-05 DIAGNOSIS — Z8759 Personal history of other complications of pregnancy, childbirth and the puerperium: Secondary | ICD-10-CM

## 2022-12-05 DIAGNOSIS — O0990 Supervision of high risk pregnancy, unspecified, unspecified trimester: Secondary | ICD-10-CM | POA: Diagnosis not present

## 2022-12-05 DIAGNOSIS — B009 Herpesviral infection, unspecified: Secondary | ICD-10-CM

## 2022-12-05 DIAGNOSIS — Z8679 Personal history of other diseases of the circulatory system: Secondary | ICD-10-CM

## 2022-12-05 DIAGNOSIS — Z3A13 13 weeks gestation of pregnancy: Secondary | ICD-10-CM

## 2022-12-05 DIAGNOSIS — O0991 Supervision of high risk pregnancy, unspecified, first trimester: Secondary | ICD-10-CM | POA: Diagnosis not present

## 2022-12-05 MED ORDER — ASPIRIN 81 MG PO CHEW
162.0000 mg | CHEWABLE_TABLET | Freq: Every day | ORAL | 2 refills | Status: DC
Start: 2022-12-05 — End: 2023-05-18

## 2022-12-05 NOTE — Addendum Note (Signed)
Addended by: Kathee Delton on: 12/05/2022 10:25 AM   Modules accepted: Orders

## 2022-12-05 NOTE — Progress Notes (Signed)
Subjective:   Julia Potter is a 32 y.o. [phone] at [redacted]w[redacted]d by early ultrasound being seen today for her first obstetrical visit.  Her obstetrical history is significant for pre-eclampsia.  Pregnancy history fully reviewed.  Patient reports nausea.  HISTORY: OB History  Gravida Para Term Preterm AB Living  6 2 0 2 3 2   SAB IAB Ectopic Multiple Live Births  3 0 0 0 2    # Outcome Date GA Lbr Len/2nd Weight Sex Type Anes PTL Lv  6 Current           5 Preterm 10/10/19 [redacted]w[redacted]d / 00:05 5 lb 9.4 oz (2.534 kg) F Vag-Vacuum EPI  LIV     Name: Kinser,GIRL Ricquel     Apgar1: 9  Apgar5: 9  4 SAB 12/2017 [redacted]w[redacted]d         3 SAB 05/2017 [redacted]w[redacted]d    SAB        Complications: Missed ab, Triplets, all stillborn, S/P D&C (status post dilation and curettage)  2 SAB 2017             Complications: Missed abortion  1 Preterm 2011 [redacted]w[redacted]d  3 lb 12 oz (1.701 kg) F Vag-Spont None  LIV     Birth Comments: preterm labor , IOL for  preeclampsia     Obstetric Comments  [id number] Admitted to [facility] for PIH, IOL. Delivered a viable female at 13 weeks   Past Medical History:  Diagnosis Date   Abnormal uterine bleeding (AUB) 01/20/2016   ALLERGIC RHINITIS    Anemia due to acute blood loss 10/12/2019   Anemia, postpartum 10/12/2019   Anxiety    no meds   Chlamydia    Chronic hypertension in pregnancy 09/29/2019   Patient had elevated BP at 4 weeks in Feb 2021 and outside of pregnancy on 02/06/2018.      Guidelines for Antenatal Testing and Sonography  (with updated ICD-10 codes)  Updated  01-17-19 with Dr. Noralee Space           INDICATION    U/S    2 X week NST/AFI   or full BPP wkly    DELIVERY      CHTN - O10.919   Group I   BP < 140/90, no meds, no preeclampsia, AGA, nml AFV      Group II  BP > 140/90,   Chronic hypertension with superimposed severe preeclampsia 10/09/2019   Depression    doing ok, in therapy - no meds   Herpes    HSV infection    Pregnancy induced hypertension 2010   resolved after  pregnancy   Rh negative state in antepartum period 05/10/2017   [ ] Rhophylac   Seasonal allergies    Past Surgical History:  Procedure Laterality Date   DILATION AND EVACUATION N/A 05/23/2017   Procedure: DILATATION AND EVACUATION;  Surgeon: Woodland Bing, MD;  Location: [facility];  Service: Gynecology;  Laterality: N/A;  needs [place]   INTRAUTERINE DEVICE INSERTION  09/2010   IUD REMOVAL     Family History  Problem Relation Age of Onset   Hypertension Mother    Diabetes Maternal Grandmother    Asthma Daughter    Social History   Tobacco Use   Smoking status: Never   Smokeless tobacco: Never  Vaping Use   Vaping status: Never Used  Substance Use Topics   Alcohol use: Not Currently    Comment: socially   Drug use: No   No Known  Allergies Current Outpatient Medications on File Prior to Visit  Medication Sig Dispense Refill   acetaminophen (TYLENOL) 500 MG tablet Take 1,000 mg by mouth every 6 (six) hours as needed for moderate pain.     diphenhydrAMINE (BENADRYL) 25 MG tablet Take 25 mg by mouth daily as needed for allergies.     Prenatal Multivit-Min-FA ([employer] PRENATAL GUMMY) 0.4 MG CHEW Chew 1 Units by mouth daily. 90 tablet 1   valACYclovir (VALTREX) 500 MG tablet Take 1 tablet (500 mg) 2 times daily for 3 days for symptoms of outbreak 6 tablet PRN   [DISCONTINUED] medroxyPROGESTERone (DEPO-PROVERA) 150 MG/ML injection Inject 150 mg into the muscle every 3 (three) months.       No current facility-administered medications on file prior to visit.     Exam   Vitals:   12/05/22 0902  BP: 132/87  Pulse: 84  Weight: 179 lb (81.2 kg)   Fetal Heart Rate (bpm): 148  Pelvic Exam: Perineum: no hemorrhoids, normal perineum   Vulva: normal external genitalia, no lesions   Vagina:  normal mucosa, normal discharge   Cervix: no lesions and normal, pap smear done.    Adnexa: normal adnexa and no mass, fullness, tenderness   Bony Pelvis: average  System: General: well-developed,  well-nourished female in no acute distress   Skin: normal coloration and turgor, no rashes   Neurologic: oriented, normal, negative, normal mood   Extremities: normal strength, tone, and muscle mass, ROM of all joints is normal   HEENT PERRLA, extraocular movement intact and sclera clear, anicteric   Mouth/Teeth mucous membranes moist, pharynx normal without lesions and dental hygiene good   Neck supple and no masses   Cardiovascular: regular rate and rhythm   Respiratory:  no respiratory distress, normal breath sounds   Abdomen: soft, non-tender; bowel sounds normal; no masses,  no organomegaly     Assessment:   Pregnancy: [id number] Patient Active Problem List   Diagnosis Date Noted   Seasonal allergies    Supervision of high risk pregnancy, antepartum 11/27/2022   Abnormal fetal ultrasound 10/24/2022   History of severe pre-eclampsia 10/12/2019   Delay postpartum hemorrhage 10/10/2019   History of preterm delivery 04/25/2019   History of PCOS 04/25/2019   History of hypertension 04/18/2019   Obesity affecting pregnancy 04/18/2019   Rh negative state in antepartum period 04/18/2019   Herpes    History of multiple miscarriages    Hirsutism 09/14/2015   Psychiatric illness 11/01/2013     Plan:  1. Supervision of high risk pregnancy, antepartum Continue routine prenatal care. New OB labs reviewed - Culture, OB Urine - Cytology - PAP( [place]) - Babyscripts Schedule Optimization - [place] Basic Panel  2. Rh negative state in antepartum period Will need Rhogam @ 28 week and pp if indicated  3. History of severe pre-eclampsia Baseline labs Begin ASA - Comprehensive metabolic panel - Protein / creatinine ratio, urine - aspirin 81 MG chewable tablet; Chew 2 tablets (162 mg total) by mouth daily.  Dispense: 180 tablet; Refill: 2  4. Herpes Will need Valtrex ppx @ 34-46 weeks  5. [redacted] weeks gestation of pregnancy   6. Abnormal fetal ultrasound For early MFM u/s  7.  Seasonal allergies On no meds for now  8. Delay postpartum hemorrhage H/o last pregnancy  9. History of hypertension Discussed goals for BP and ASA  Baseline labs - Comprehensive metabolic panel - Protein / creatinine ratio, urine - aspirin 81 MG chewable tablet; Chew 2 tablets (  162 mg total) by mouth daily.  Dispense: 180 tablet; Refill: 2 - TSH   Initial labs drawn. Continue prenatal vitamins. Genetic Screening discussed, NIPS: results reviewed. Ultrasound discussed; fetal anatomic survey: ordered. Problem list reviewed and updated. The nature of [facility] Practice with multiple MDs and other Advanced Practice Providers was explained to patient; also emphasized that residents, students are part of our team. Routine obstetric precautions reviewed. Return in 4 weeks (on 01/02/2023).

## 2022-12-06 LAB — PROTEIN / CREATININE RATIO, URINE
Creatinine, Urine: 100.8 mg/dL
Protein, Ur: 8.9 mg/dL
Protein/Creat Ratio: 88 mg/g{creat} (ref 0–200)

## 2022-12-06 LAB — COMPREHENSIVE METABOLIC PANEL
ALT: 8 [IU]/L (ref 0–32)
AST: 15 [IU]/L (ref 0–40)
Albumin: 3.9 g/dL — ABNORMAL LOW (ref 4.0–5.0)
Alkaline Phosphatase: 65 [IU]/L (ref 44–121)
BUN/Creatinine Ratio: 18 (ref 9–23)
BUN: 9 mg/dL (ref 6–20)
Bilirubin Total: 0.4 mg/dL (ref 0.0–1.2)
CO2: 19 mmol/L — ABNORMAL LOW (ref 20–29)
Calcium: 9.4 mg/dL (ref 8.7–10.2)
Chloride: 101 mmol/L (ref 96–106)
Creatinine, Ser: 0.5 mg/dL — ABNORMAL LOW (ref 0.57–1.00)
Globulin, Total: 2.9 g/dL (ref 1.5–4.5)
Glucose: 74 mg/dL (ref 70–99)
Potassium: 4.8 mmol/L (ref 3.5–5.2)
Sodium: 135 mmol/L (ref 134–144)
Total Protein: 6.8 g/dL (ref 6.0–8.5)
eGFR: 129 mL/min/{1.73_m2} (ref >59)

## 2022-12-06 LAB — TSH: TSH: 0.506 u[IU]/mL (ref 0.450–4.500)

## 2022-12-07 LAB — CULTURE, OB URINE

## 2022-12-07 LAB — URINE CULTURE, OB REFLEX

## 2022-12-08 ENCOUNTER — Telehealth: Payer: Self-pay | Admitting: Lactation Services

## 2022-12-08 LAB — CYTOLOGY - PAP
Comment: NEGATIVE
Diagnosis: NEGATIVE
High risk HPV: NEGATIVE

## 2022-12-08 NOTE — Telephone Encounter (Signed)
Patient called to confirm that she does not have need for food and does not smoke as she thought she filled out a form incorrectly. Per chart review, the above items are noted correctly in the chart. Called patient to let her know. Patient voiced understanding.

## 2022-12-13 ENCOUNTER — Other Ambulatory Visit: Payer: Self-pay | Admitting: *Deleted

## 2022-12-13 ENCOUNTER — Other Ambulatory Visit: Payer: Self-pay

## 2022-12-13 ENCOUNTER — Ambulatory Visit: Payer: Medicaid Other | Attending: Obstetrics and Gynecology | Admitting: *Deleted

## 2022-12-13 ENCOUNTER — Ambulatory Visit: Payer: Medicaid Other

## 2022-12-13 VITALS — BP 123/75 | HR 87

## 2022-12-13 DIAGNOSIS — O112 Pre-existing hypertension with pre-eclampsia, second trimester: Secondary | ICD-10-CM | POA: Diagnosis not present

## 2022-12-13 DIAGNOSIS — O09292 Supervision of pregnancy with other poor reproductive or obstetric history, second trimester: Secondary | ICD-10-CM

## 2022-12-13 DIAGNOSIS — Z363 Encounter for antenatal screening for malformations: Secondary | ICD-10-CM | POA: Diagnosis not present

## 2022-12-13 DIAGNOSIS — O321XX0 Maternal care for breech presentation, not applicable or unspecified: Secondary | ICD-10-CM | POA: Insufficient documentation

## 2022-12-13 DIAGNOSIS — O283 Abnormal ultrasonic finding on antenatal screening of mother: Secondary | ICD-10-CM | POA: Diagnosis not present

## 2022-12-13 DIAGNOSIS — Z3A15 15 weeks gestation of pregnancy: Secondary | ICD-10-CM | POA: Insufficient documentation

## 2022-12-13 DIAGNOSIS — Z362 Encounter for other antenatal screening follow-up: Secondary | ICD-10-CM

## 2022-12-13 DIAGNOSIS — O09212 Supervision of pregnancy with history of pre-term labor, second trimester: Secondary | ICD-10-CM

## 2022-12-13 DIAGNOSIS — O0990 Supervision of high risk pregnancy, unspecified, unspecified trimester: Secondary | ICD-10-CM

## 2022-12-19 ENCOUNTER — Ambulatory Visit: Payer: PRIVATE HEALTH INSURANCE

## 2022-12-19 ENCOUNTER — Other Ambulatory Visit: Payer: PRIVATE HEALTH INSURANCE

## 2023-01-02 ENCOUNTER — Ambulatory Visit (INDEPENDENT_AMBULATORY_CARE_PROVIDER_SITE_OTHER): Payer: Medicaid Other | Admitting: Obstetrics and Gynecology

## 2023-01-02 ENCOUNTER — Other Ambulatory Visit: Payer: Self-pay

## 2023-01-02 VITALS — BP 132/85 | HR 109 | Wt 197.3 lb

## 2023-01-02 DIAGNOSIS — O09892 Supervision of other high risk pregnancies, second trimester: Secondary | ICD-10-CM

## 2023-01-02 DIAGNOSIS — O0990 Supervision of high risk pregnancy, unspecified, unspecified trimester: Secondary | ICD-10-CM

## 2023-01-02 DIAGNOSIS — Z8751 Personal history of pre-term labor: Secondary | ICD-10-CM

## 2023-01-02 DIAGNOSIS — Z8679 Personal history of other diseases of the circulatory system: Secondary | ICD-10-CM

## 2023-01-02 DIAGNOSIS — Z8759 Personal history of other complications of pregnancy, childbirth and the puerperium: Secondary | ICD-10-CM

## 2023-01-02 DIAGNOSIS — O09212 Supervision of pregnancy with history of pre-term labor, second trimester: Secondary | ICD-10-CM

## 2023-01-02 DIAGNOSIS — Z6791 Unspecified blood type, Rh negative: Secondary | ICD-10-CM

## 2023-01-02 DIAGNOSIS — Z3A17 17 weeks gestation of pregnancy: Secondary | ICD-10-CM

## 2023-01-02 NOTE — Progress Notes (Signed)
   PRENATAL VISIT NOTE  Subjective:  Julia Potter is a 32 y.o. [phone] at [redacted]w[redacted]d being seen today for ongoing prenatal care.  She is currently monitored for the following issues for this high-risk pregnancy and has Psychiatric illness; Hirsutism; History of hypertension; Obesity affecting pregnancy; Herpes; History of multiple miscarriages; Rh negative state in antepartum period; History of preterm delivery; History of PCOS; Delay postpartum hemorrhage; History of severe pre-eclampsia; Abnormal fetal ultrasound; Supervision of high risk pregnancy, antepartum; and Seasonal allergies on their problem list.  Patient doing well with no acute concerns today. She reports no complaints.  Contractions: Not present. Vag. Bleeding: None.  Movement: Absent. Denies leaking of fluid.   The following portions of the patient's history were reviewed and updated as appropriate: allergies, current medications, past family history, past medical history, past social history, past surgical history and problem list. Problem list updated.  Objective:   Vitals:   01/02/23 1323  BP: 132/85  Pulse: (!) 109  Weight: 197 lb 4.8 oz (89.5 kg)    Fetal Status: Fetal Heart Rate (bpm): 143 Fundal Height: 18 cm Movement: Absent     General:  Alert, oriented and cooperative. Patient is in no acute distress.  Skin: Skin is warm and dry. No rash noted.   Cardiovascular: Normal heart rate noted  Respiratory: Normal respiratory effort, no problems with respiration noted  Abdomen: Soft, gravid, appropriate for gestational age.  Pain/Pressure: Absent     Pelvic: Cervical exam deferred        Extremities: Normal range of motion.     Mental Status:  Normal mood and affect. Normal behavior. Normal judgment and thought content.   Assessment and Plan:  Pregnancy: [id number] at [redacted]w[redacted]d  1. Supervision of high risk pregnancy, antepartum Continue routine prenatal care - AFP, Serum, Open Spina Bifida  2. [redacted] weeks gestation of  pregnancy   3. History of hypertension Current BP borderline but normal  4. History of preterm delivery No s/sx  of preterm labor  5. History of severe pre-eclampsia Pt denies any s/sx of preeclampsia  Pt compliant with baby ASA     6. Rh negative state in antepartum period Rhogam after 26 weeks and post delivery  Preterm labor symptoms and general obstetric precautions including but not limited to vaginal bleeding, contractions, leaking of fluid and fetal movement were reviewed in detail with the patient.  Please refer to After Visit Summary for other counseling recommendations.   Return in about 4 weeks (around 01/30/2023) for [facility], in person.   Mariel Aloe, MD Faculty Attending Center for [facility]

## 2023-01-04 LAB — AFP, SERUM, OPEN SPINA BIFIDA
AFP MoM: 1.21
AFP Value: 42.4 ng/mL
Gest. Age on Collection Date: 17 wk
Maternal Age At EDD: 30.8 a
OSBR Risk 1 IN: 10000
Test Results:: NEGATIVE
Weight: 197 [lb_av]

## 2023-01-23 ENCOUNTER — Ambulatory Visit: Payer: Medicaid Other

## 2023-01-23 ENCOUNTER — Other Ambulatory Visit: Payer: Self-pay | Admitting: *Deleted

## 2023-01-23 ENCOUNTER — Encounter: Payer: Self-pay | Admitting: *Deleted

## 2023-01-23 ENCOUNTER — Other Ambulatory Visit: Payer: Self-pay

## 2023-01-23 ENCOUNTER — Ambulatory Visit: Payer: Medicaid Other | Attending: Obstetrics and Gynecology | Admitting: *Deleted

## 2023-01-23 VITALS — BP 133/81 | HR 100

## 2023-01-23 DIAGNOSIS — O0990 Supervision of high risk pregnancy, unspecified, unspecified trimester: Secondary | ICD-10-CM

## 2023-01-23 DIAGNOSIS — O09292 Supervision of pregnancy with other poor reproductive or obstetric history, second trimester: Secondary | ICD-10-CM

## 2023-01-23 DIAGNOSIS — Z3A20 20 weeks gestation of pregnancy: Secondary | ICD-10-CM

## 2023-01-23 DIAGNOSIS — O1492 Unspecified pre-eclampsia, second trimester: Secondary | ICD-10-CM | POA: Diagnosis not present

## 2023-01-23 DIAGNOSIS — O09212 Supervision of pregnancy with history of pre-term labor, second trimester: Secondary | ICD-10-CM | POA: Diagnosis not present

## 2023-01-23 DIAGNOSIS — Z362 Encounter for other antenatal screening follow-up: Secondary | ICD-10-CM

## 2023-01-23 DIAGNOSIS — O09293 Supervision of pregnancy with other poor reproductive or obstetric history, third trimester: Secondary | ICD-10-CM | POA: Diagnosis present

## 2023-01-23 DIAGNOSIS — O99210 Obesity complicating pregnancy, unspecified trimester: Secondary | ICD-10-CM

## 2023-01-23 DIAGNOSIS — O365990 Maternal care for other known or suspected poor fetal growth, unspecified trimester, not applicable or unspecified: Secondary | ICD-10-CM

## 2023-01-23 DIAGNOSIS — Z363 Encounter for antenatal screening for malformations: Secondary | ICD-10-CM | POA: Insufficient documentation

## 2023-01-30 ENCOUNTER — Encounter: Payer: Self-pay | Admitting: Obstetrics & Gynecology

## 2023-01-30 ENCOUNTER — Other Ambulatory Visit: Payer: Self-pay

## 2023-01-30 ENCOUNTER — Ambulatory Visit (INDEPENDENT_AMBULATORY_CARE_PROVIDER_SITE_OTHER): Payer: Medicaid Other | Admitting: Obstetrics & Gynecology

## 2023-01-30 VITALS — BP 149/83 | HR 114 | Wt 209.0 lb

## 2023-01-30 DIAGNOSIS — O10919 Unspecified pre-existing hypertension complicating pregnancy, unspecified trimester: Secondary | ICD-10-CM

## 2023-01-30 DIAGNOSIS — O365920 Maternal care for other known or suspected poor fetal growth, second trimester, not applicable or unspecified: Secondary | ICD-10-CM

## 2023-01-30 DIAGNOSIS — O0990 Supervision of high risk pregnancy, unspecified, unspecified trimester: Secondary | ICD-10-CM

## 2023-01-30 DIAGNOSIS — Z3A21 21 weeks gestation of pregnancy: Secondary | ICD-10-CM

## 2023-01-30 DIAGNOSIS — Z8759 Personal history of other complications of pregnancy, childbirth and the puerperium: Secondary | ICD-10-CM

## 2023-01-30 MED ORDER — NIFEDIPINE ER OSMOTIC RELEASE 30 MG PO TB24
30.0000 mg | ORAL_TABLET | Freq: Every day | ORAL | 2 refills | Status: DC
Start: 2023-01-30 — End: 2023-05-01

## 2023-01-30 NOTE — Progress Notes (Signed)
   PRENATAL VISIT NOTE  Subjective:  Julia Potter is a 32 y.o. [phone] at [redacted]w[redacted]d being seen today for ongoing prenatal care.  She is currently monitored for the following issues for this high-risk pregnancy and has Psychiatric illness; Hirsutism; Chronic hypertension affecting pregnancy; Obesity affecting pregnancy; Herpes; History of multiple miscarriages; Rh negative state in antepartum period; History of preterm delivery due to severe preeclampsia; History of PCOS; History of delayed postpartum hemorrhage; History of severe pre-eclampsia; IUGR (intrauterine growth restriction) affecting care of mother, second trimester; and Supervision of high risk pregnancy, antepartum on their problem list.  Patient reports seeing some "squiggles" in the last few days, none currently. No headaches, other visual symptoms.  No RUQ pain or epigeastric pain, no N/V.  Contractions: Not present. Vag. Bleeding: None.  Movement: Present. Denies leaking of fluid.   The following portions of the patient's history were reviewed and updated as appropriate: allergies, current medications, past family history, past medical history, past social history, past surgical history and problem list.   Objective:   Vitals:   01/30/23 1306 01/30/23 1326  BP: (!) 145/89 (!) 149/83  Pulse: (!) 114   Weight: 209 lb (94.8 kg)     Fetal Status: Fetal Heart Rate (bpm): 145 on ultrasound   Movement: Present     General:  Alert, oriented and cooperative. Patient is in no acute distress.  Skin: Skin is warm and dry. No rash noted.   Cardiovascular: Normal heart rate noted  Respiratory: Normal respiratory effort, no problems with respiration noted  Abdomen: Soft, gravid, appropriate for gestational age.  Pain/Pressure: Absent     Pelvic: Cervical exam deferred        Extremities: Normal range of motion.  Edema: None  Mental Status: Normal mood and affect. Normal behavior. Normal judgment and thought content.   Assessment and  Plan:  Pregnancy: [id number] at [redacted]w[redacted]d 1. Chronic hypertension affecting pregnancy (Primary) 2. History of severe pre-eclampsia Concerned about visual symptoms and her elevated BPs. Will start on Nifedipine therapy, continue to monitor closely. She will check BPs at home as instructed.  Will check labs today also, will follow up results and manage accordingly. Referred to Cardio OB.  Already on ASA 162 mg daily.  Will continue close monitoring.  Severe preeclampsia precautions reviewed.  - CBC - Comprehensive metabolic panel - Protein / creatinine ratio, urine - NIFEdipine (PROCARDIA-XL/NIFEDICAL-XL) 30 MG 24 hr tablet; Take 1 tablet (30 mg total) by mouth daily.  Dispense: 30 tablet; Refill: 2 - AMB Referral to Cardio Obstetrics  3. IUGR (intrauterine growth restriction) affecting care of mother, second trimester EFW 8% on 12/23, follow up scans ordered. Likely 2/2 CHTN.  4. [redacted] weeks gestation of pregnancy 5. Supervision of high risk pregnancy, antepartum Preterm labor symptoms and general obstetric precautions including but not limited to vaginal bleeding, contractions, leaking of fluid and fetal movement were reviewed in detail with the patient. Please refer to After Visit Summary for other counseling recommendations.   Return in about 2 weeks (around 02/13/2023) for OFFICE OB VISIT (MD only).  Future Appointments  Date Time Provider Department Center  02/20/2023  1:15 PM [facility] NURSE [facility]  02/20/2023  1:30 PM [facility] US2 [facility]    Jaynie Collins, MD

## 2023-01-31 LAB — COMPREHENSIVE METABOLIC PANEL
ALT: 12 [IU]/L (ref 0–32)
AST: 15 [IU]/L (ref 0–40)
Albumin: 3.6 g/dL — ABNORMAL LOW (ref 4.0–5.0)
Alkaline Phosphatase: 72 [IU]/L (ref 44–121)
BUN/Creatinine Ratio: 14 (ref 9–23)
BUN: 7 mg/dL (ref 6–20)
Bilirubin Total: <0.2 mg/dL (ref 0.0–1.2)
CO2: 21 mmol/L (ref 20–29)
Calcium: 8.9 mg/dL (ref 8.7–10.2)
Chloride: 103 mmol/L (ref 96–106)
Creatinine, Ser: 0.51 mg/dL — ABNORMAL LOW (ref 0.57–1.00)
Globulin, Total: 3.3 g/dL (ref 1.5–4.5)
Glucose: 69 mg/dL — ABNORMAL LOW (ref 70–99)
Potassium: 4.1 mmol/L (ref 3.5–5.2)
Sodium: 137 mmol/L (ref 134–144)
Total Protein: 6.9 g/dL (ref 6.0–8.5)
eGFR: 129 mL/min/{1.73_m2} (ref >59)

## 2023-01-31 LAB — CBC
Hematocrit: 38.1 % (ref 34.0–46.6)
Hemoglobin: 12.7 g/dL (ref 11.1–15.9)
MCH: 32 pg (ref 26.6–33.0)
MCHC: 33.3 g/dL (ref 31.5–35.7)
MCV: 96 fL (ref 79–97)
Platelets: 357 10*3/uL (ref 150–450)
RBC: 3.97 x10E6/uL (ref 3.77–5.28)
RDW: 12.5 % (ref 11.7–15.4)
WBC: 11.7 10*3/uL — ABNORMAL HIGH (ref 3.4–10.8)

## 2023-01-31 LAB — PROTEIN / CREATININE RATIO, URINE
Creatinine, Urine: 71.1 mg/dL
Protein, Ur: 7.5 mg/dL
Protein/Creat Ratio: 105 mg/g{creat} (ref 0–200)

## 2023-02-01 NOTE — L&D Delivery Note (Signed)
 OB/GYN Faculty Practice Delivery Note  Julia Potter is a 33 y.o. [id number] s/p SVD at [redacted]w[redacted]d. She was admitted for IOL due to superimposed pre-eclampsia  ROM: 3h 52m with clear fluid GBS Status: Negative/-- (04/07 1300) Maximum Maternal Temperature: 98.37F   Labor Progress: Initial SVE: 1/30/-3. She then progressed to complete.   Delivery Date/Time: 05/16/23 1930 Delivery: Called to room and patient was reporting increasing vaginal pressure, she was checked and found to be 8cm dilated and over the next 30-40 min was found to be 9.5cm with anterior lip and began involuntarily pushing. Patient began involuntarily pushing. On recheck, she was complete and continued to push. Head delivered direct OA. Baby noted to have cord around foot, reduced at perineum. Shoulder and body delivered in usual fashion. Infant with spontaneous cry, placed on mother's abdomen, dried and stimulated. Cord clamped x 2 after 1-minute delay, and cut by father of baby, Devin. Cord blood drawn. Placenta delivered spontaneously with gentle cord traction. Pitocin and TXA started after cord clamped and cut. On fundal rub, patient felt to be boggy with several clots expressed and persistent trickle of bleeding. Discussed uterine sweep with patient to ensure no clots or placental remnants in uterus. Patient did not tolerate uterine sweep, so anesthesia called to assist with pain management. In the interim, 1000mcg rectal Cytotec given. Patient unable to tolerate fundal rubs, but trickling did slow some with Cytotec. Bladder emptied with in-and-out cath with 50cc clear urine removed. After additional pain medication provided by anesthesia, fundal sweep performed with several large clots evacuated. Called for JADA to room and JADA placed after subsequent uterine sweep with additional moderate sized clots removed. Labia, perineum, vagina, and cervix inspected inspected with no lacerations noted.  Baby Weight: pending  Placenta: Sent to  L&D Complications: Postpartum uterine atony with JADA placed Lacerations: None EBL: 600 mL Analgesia: Epidural   Infant:  APGAR (1 MIN): 8  APGAR (5 MINS): 9  Melanie Spires, MD OB Fellow, Faculty Practice [facility], Center for [facility]

## 2023-02-13 ENCOUNTER — Other Ambulatory Visit: Payer: Self-pay

## 2023-02-13 ENCOUNTER — Encounter: Payer: Self-pay | Admitting: Obstetrics and Gynecology

## 2023-02-13 ENCOUNTER — Ambulatory Visit (INDEPENDENT_AMBULATORY_CARE_PROVIDER_SITE_OTHER): Payer: Medicaid Other | Admitting: Obstetrics and Gynecology

## 2023-02-13 VITALS — BP 128/79 | HR 121 | Wt 216.3 lb

## 2023-02-13 DIAGNOSIS — O26892 Other specified pregnancy related conditions, second trimester: Secondary | ICD-10-CM

## 2023-02-13 DIAGNOSIS — Z6791 Unspecified blood type, Rh negative: Secondary | ICD-10-CM

## 2023-02-13 DIAGNOSIS — B009 Herpesviral infection, unspecified: Secondary | ICD-10-CM

## 2023-02-13 DIAGNOSIS — Z3A23 23 weeks gestation of pregnancy: Secondary | ICD-10-CM

## 2023-02-13 DIAGNOSIS — O26899 Other specified pregnancy related conditions, unspecified trimester: Secondary | ICD-10-CM

## 2023-02-13 DIAGNOSIS — O10919 Unspecified pre-existing hypertension complicating pregnancy, unspecified trimester: Secondary | ICD-10-CM

## 2023-02-13 DIAGNOSIS — Z8759 Personal history of other complications of pregnancy, childbirth and the puerperium: Secondary | ICD-10-CM

## 2023-02-13 DIAGNOSIS — O365920 Maternal care for other known or suspected poor fetal growth, second trimester, not applicable or unspecified: Secondary | ICD-10-CM

## 2023-02-13 DIAGNOSIS — O10912 Unspecified pre-existing hypertension complicating pregnancy, second trimester: Secondary | ICD-10-CM

## 2023-02-13 DIAGNOSIS — O0992 Supervision of high risk pregnancy, unspecified, second trimester: Secondary | ICD-10-CM

## 2023-02-13 DIAGNOSIS — O0990 Supervision of high risk pregnancy, unspecified, unspecified trimester: Secondary | ICD-10-CM

## 2023-02-13 NOTE — Progress Notes (Signed)
   PRENATAL VISIT NOTE  Subjective:  Julia Potter is a 33 y.o. [phone] at [redacted]w[redacted]d being seen today for ongoing prenatal care.  She is currently monitored for the following issues for this high-risk pregnancy and has Psychiatric illness; Hirsutism; Chronic hypertension affecting pregnancy; Obesity affecting pregnancy; Herpes; Rh negative state in antepartum period; History of preterm delivery due to severe preeclampsia; History of delayed postpartum hemorrhage; History of severe pre-eclampsia; IUGR (intrauterine growth restriction) affecting care of mother, second trimester; and Supervision of high risk pregnancy, antepartum on their problem list.  Patient reports no complaints.  Contractions: Not present. Vag. Bleeding: None.  Movement: Present. Denies leaking of fluid.   The following portions of the patient's history were reviewed and updated as appropriate: allergies, current medications, past family history, past medical history, past social history, past surgical history and problem list.   Objective:   Vitals:   02/13/23 0909  BP: 128/79  Pulse: (!) 121  Weight: 216 lb 5 oz (98.1 kg)    Fetal Status: Fetal Heart Rate (bpm): 141   Movement: Present     General:  Alert, oriented and cooperative. Patient is in no acute distress.  Skin: Skin is warm and dry. No rash noted.   Cardiovascular: Normal heart rate noted  Respiratory: Normal respiratory effort, no problems with respiration noted  Abdomen: Soft, gravid, appropriate for gestational age.  Pain/Pressure: Absent     Pelvic: Cervical exam deferred        Extremities: Normal range of motion.  Edema: None  Mental Status: Normal mood and affect. Normal behavior. Normal judgment and thought content.   Assessment and Plan:  Pregnancy: [id number] at [redacted]w[redacted]d 1. [redacted] weeks gestation of pregnancy (Primary) 28wk labs next visit  2. Chronic hypertension affecting pregnancy Confirms on procardia  30 qday and asa 162 qday. Continue current  regimen Sees Dr. [name] for consult visit on 2/3  3. Herpes Ppx at 34-36wks  4. IUGR (intrauterine growth restriction) affecting care of mother, second trimester 12/23: 8%. F/u rpt next weight. Maternal weight gain fine  5. Rh negative state in antepartum period Rhogam, ab screen nv  6. History of severe pre-eclampsia  7. Supervision of high risk pregnancy, antepartum  Preterm labor symptoms and general obstetric precautions including but not limited to vaginal bleeding, contractions, leaking of fluid and fetal movement were reviewed in detail with the patient. Please refer to After Visit Summary for other counseling recommendations.   Return in about 3 weeks (around 03/06/2023) for in person, high risk ob, md or app, fasting 2hr GTT.  Future Appointments  Date Time Provider Department Center  02/20/2023  1:15 PM [facility]  02/20/2023  1:30 PM [facility] US2 [facility]  03/06/2023  9:00 AM Tobb, Kardie, DO [facility] None    Bebe Furry, MD

## 2023-02-20 ENCOUNTER — Ambulatory Visit: Payer: Medicaid Other | Admitting: *Deleted

## 2023-02-20 ENCOUNTER — Other Ambulatory Visit: Payer: Self-pay | Admitting: *Deleted

## 2023-02-20 ENCOUNTER — Encounter: Payer: Self-pay | Admitting: *Deleted

## 2023-02-20 ENCOUNTER — Ambulatory Visit: Payer: Medicaid Other | Attending: Obstetrics and Gynecology

## 2023-02-20 ENCOUNTER — Other Ambulatory Visit: Payer: Self-pay

## 2023-02-20 VITALS — BP 126/74 | HR 102

## 2023-02-20 DIAGNOSIS — O10012 Pre-existing essential hypertension complicating pregnancy, second trimester: Secondary | ICD-10-CM

## 2023-02-20 DIAGNOSIS — O365990 Maternal care for other known or suspected poor fetal growth, unspecified trimester, not applicable or unspecified: Secondary | ICD-10-CM | POA: Diagnosis present

## 2023-02-20 DIAGNOSIS — O0990 Supervision of high risk pregnancy, unspecified, unspecified trimester: Secondary | ICD-10-CM

## 2023-02-20 DIAGNOSIS — Z3A24 24 weeks gestation of pregnancy: Secondary | ICD-10-CM | POA: Diagnosis not present

## 2023-02-20 DIAGNOSIS — O99210 Obesity complicating pregnancy, unspecified trimester: Secondary | ICD-10-CM | POA: Diagnosis present

## 2023-02-20 DIAGNOSIS — O09292 Supervision of pregnancy with other poor reproductive or obstetric history, second trimester: Secondary | ICD-10-CM

## 2023-02-20 DIAGNOSIS — O10919 Unspecified pre-existing hypertension complicating pregnancy, unspecified trimester: Secondary | ICD-10-CM

## 2023-02-20 DIAGNOSIS — O09212 Supervision of pregnancy with history of pre-term labor, second trimester: Secondary | ICD-10-CM | POA: Diagnosis not present

## 2023-03-06 ENCOUNTER — Encounter: Payer: Self-pay | Admitting: Cardiology

## 2023-03-06 ENCOUNTER — Ambulatory Visit: Payer: Medicaid Other | Admitting: Obstetrics and Gynecology

## 2023-03-06 ENCOUNTER — Ambulatory Visit: Payer: Medicaid Other | Attending: Cardiology | Admitting: Cardiology

## 2023-03-06 ENCOUNTER — Other Ambulatory Visit: Payer: Self-pay

## 2023-03-06 VITALS — BP 127/80 | HR 103 | Wt 223.0 lb

## 2023-03-06 VITALS — BP 130/78 | HR 119 | Ht 62.0 in | Wt 226.2 lb

## 2023-03-06 DIAGNOSIS — O10912 Unspecified pre-existing hypertension complicating pregnancy, second trimester: Secondary | ICD-10-CM

## 2023-03-06 DIAGNOSIS — Z8759 Personal history of other complications of pregnancy, childbirth and the puerperium: Secondary | ICD-10-CM | POA: Diagnosis not present

## 2023-03-06 DIAGNOSIS — Z7689 Persons encountering health services in other specified circumstances: Secondary | ICD-10-CM

## 2023-03-06 DIAGNOSIS — Z6791 Unspecified blood type, Rh negative: Secondary | ICD-10-CM

## 2023-03-06 DIAGNOSIS — B009 Herpesviral infection, unspecified: Secondary | ICD-10-CM | POA: Diagnosis not present

## 2023-03-06 DIAGNOSIS — O10919 Unspecified pre-existing hypertension complicating pregnancy, unspecified trimester: Secondary | ICD-10-CM

## 2023-03-06 DIAGNOSIS — Z3A26 26 weeks gestation of pregnancy: Secondary | ICD-10-CM

## 2023-03-06 DIAGNOSIS — O0992 Supervision of high risk pregnancy, unspecified, second trimester: Secondary | ICD-10-CM

## 2023-03-06 DIAGNOSIS — O99212 Obesity complicating pregnancy, second trimester: Secondary | ICD-10-CM

## 2023-03-06 DIAGNOSIS — O0990 Supervision of high risk pregnancy, unspecified, unspecified trimester: Secondary | ICD-10-CM

## 2023-03-06 DIAGNOSIS — O26892 Other specified pregnancy related conditions, second trimester: Secondary | ICD-10-CM

## 2023-03-06 DIAGNOSIS — O26899 Other specified pregnancy related conditions, unspecified trimester: Secondary | ICD-10-CM

## 2023-03-06 NOTE — Progress Notes (Signed)
Cardio-Obstetrics Clinic  New Evaluation  Date:  03/06/2023   ID:  Julia Potter, DOB 13-Apr-1990, MRN [id number]  PCP:  Garnette Gunner, MD   [facility] Providers Cardiologist:  Thomasene Ripple, DO  Electrophysiologist:  None       Referring MD: Tereso Newcomer, MD   Chief Complaint: " I am ok"  History of Present Illness:    Julia Potter is a 33 y.o. female [G6P0232] who is being seen today for the evaluation of chronic hypertension in pregnancy at the request of Anyanwu, Ugonna A, MD.   She is [redacted] weeks gestation with a history of preeclampsia in a previous pregnancy, presents for a routine prenatal visit. They report taking two aspirins daily and a medication for blood pressure control. They have been monitoring their blood pressure at home, which has been running around 138/74. They deny any blood pressure readings over 140 at home. They also report a recent jury duty summons, which may be contributing to stress.  The patient has three previous children, all delivered vaginally without complications. They have been compliant with prenatal care, including regular fetal monitoring. They have not reported any new or concerning symptoms related to their pregnancy.  Prior CV Studies Reviewed: The following studies were reviewed today:   Past Medical History:  Diagnosis Date   Abnormal uterine bleeding (AUB) 01/20/2016   ALLERGIC RHINITIS    Anemia, postpartum 10/12/2019   Anxiety    no meds   Chlamydia    Chronic hypertension in pregnancy 09/29/2019   Chronic hypertension with superimposed severe preeclampsia 10/09/2019   Depression    doing ok, in therapy - no meds   Herpes    History of multiple miscarriages    History of PCOS 04/25/2019   HSV infection    Preeclampsia 2010   resolved after pregnancy   Rh negative state in antepartum period 05/10/2017   Seasonal allergies     Past Surgical History:  Procedure Laterality Date   DILATION AND  EVACUATION N/A 05/23/2017   Procedure: DILATATION AND EVACUATION;  Surgeon: Buffalo Bing, MD;  Location: [facility];  Service: Gynecology;  Laterality: N/A;  needs [place]   INTRAUTERINE DEVICE INSERTION  09/2010   IUD REMOVAL        OB History     Gravida  6   Para  2   Term  0   Preterm  2   AB  3   Living  2      SAB  3   IAB  0   Ectopic  0   Multiple  0   Live Births  2        Obstetric Comments  G1P0101 Admitted to [facility] for PIH, IOL. Delivered a viable female at 34 weeks             Current Medications: Current Meds  Medication Sig   acetaminophen (TYLENOL) 500 MG tablet Take 1,000 mg by mouth every 6 (six) hours as needed for moderate pain (pain score 4-6).   aspirin 81 MG chewable tablet Chew 2 tablets (162 mg total) by mouth daily.   diphenhydrAMINE (BENADRYL) 25 MG tablet Take 25 mg by mouth daily as needed for allergies.   NIFEdipine (PROCARDIA-XL/NIFEDICAL-XL) 30 MG 24 hr tablet Take 1 tablet (30 mg total) by mouth daily.   Prenatal Multivit-Min-FA ([employer] PRENATAL GUMMY) 0.4 MG CHEW Chew 1 Units by mouth daily.   valACYclovir (VALTREX) 500 MG tablet Take 1 tablet (500  mg) 2 times daily for 3 days for symptoms of outbreak     Allergies:   Patient has no known allergies.   Social History   Socioeconomic History   Marital status: Single    Spouse name: Not on file   Number of children: Not on file   Years of education: Not on file   Highest education level: Associate degree: occupational, Scientist, product/process development, or vocational program  Occupational History   Not on file  Tobacco Use   Smoking status: Never   Smokeless tobacco: Never  Vaping Use   Vaping status: Never Used  Substance and Sexual Activity   Alcohol use: Not Currently    Comment: socially   Drug use: No   Sexual activity: Yes    Birth control/protection: None  Other Topics Concern   Not on file  Social History Narrative   Lives with mom, sister and brother   Has one daughter born in  [birth year] [address]   Wants to go to law school   Social Drivers of Health   Financial Resource Strain: Low Risk  (10/24/2022)   Overall Financial Resource Strain (CARDIA)    Difficulty of Paying Living Expenses: Not hard at all  Food Insecurity: No Food Insecurity (12/05/2022)   Hunger Vital Sign    Worried About Running Out of Food in the Last Year: Never true    Ran Out of Food in the Last Year: Never true  Transportation Needs: No Transportation Needs (12/05/2022)   PRAPARE - Administrator, Civil Service (Medical): No    Lack of Transportation (Non-Medical): No  Physical Activity: Unknown (10/24/2022)   Exercise Vital Sign    Days of Exercise per Week: 0 days    Minutes of Exercise per Session: Not on file  Stress: Stress Concern Present (10/24/2022)   [employer] of Occupational Health - Occupational Stress Questionnaire    Feeling of Stress : Rather much  Social Connections: Moderately Integrated (10/24/2022)   Social Connection and Isolation Panel [NHANES]    Frequency of Communication with Friends and Family: More than three times a week    Frequency of Social Gatherings with Friends and Family: Twice a week    Attends Religious Services: 1 to 4 times per year    Active Member of [employer] or Organizations: No    Attends Engineer, structural: Not on file    Marital Status: Living with partner      Family History  Problem Relation Age of Onset   Hypertension Mother    Asthma Brother    Asthma Daughter    Diabetes Maternal Grandmother    Cancer Neg Hx    Heart disease Neg Hx       ROS:   Please see the history of present illness.     All other systems reviewed and are negative.   Labs/EKG Reviewed:    EKG:   EKG was ordered today.  The ekg ordered today demonstrates sinus tachycardia, heart rate 119 bpm with possible left atrial enlargement.  Recent Labs: 12/05/2022: TSH 0.506 01/30/2023: ALT 12; BUN 7; Creatinine, Ser 0.51; Hemoglobin  12.7; Platelets 357; Potassium 4.1; Sodium 137   Recent Lipid Panel No results found for: "CHOL", "TRIG", "HDL", "CHOLHDL", "LDLCALC", "LDLDIRECT"  Physical Exam:    VS:  BP 130/78 (BP Location: Right Arm)   Pulse (!) 119   Ht 5\' 2"  (1.575 m)   Wt 226 lb 3.2 oz (102.6 kg)   LMP 09/01/2022  SpO2 97%   BMI 41.37 kg/m     Wt Readings from Last 3 Encounters:  03/06/23 226 lb 3.2 oz (102.6 kg)  02/13/23 216 lb 5 oz (98.1 kg)  01/30/23 209 lb (94.8 kg)     GEN:  Well nourished, well developed in no acute distress HEENT: Normal NECK: No JVD; No carotid bruits LYMPHATICS: No lymphadenopathy CARDIAC: RRR, no murmurs, rubs, gallops RESPIRATORY:  Clear to auscultation without rales, wheezing or rhonchi  ABDOMEN: Soft, non-tender, non-distended MUSCULOSKELETAL:  No edema; No deformity  SKIN: Warm and dry NEUROLOGIC:  Alert and oriented x 3 PSYCHIATRIC:  Normal affect    Risk Assessment/Risk Calculators:     CARPREG II Risk Prediction Index Score:  1.  The patient's risk for a primary cardiac event is 5%.   Modified World Health Organization [facility]) Classification of Maternal CV Risk   Class I         ASSESSMENT & PLAN:    Chronic hypertension in pregnancy  History of Preeclampsia  [redacted] weeks gestation, third pregnancy, all previous vaginal deliveries. Currently on aspirin and antihypertensive medication. Blood pressure readings at home have been around 138 systolic. Noted physiological heart murmur common in pregnancy. -Continue aspirin and antihypertensive medication. -Check blood pressure daily at home and upload readings weekly via MyChart for monitoring. -Return for follow-up in 4 weeks or sooner if blood pressure readings increase significantly.  Physiological Heart Murmur Common in pregnancy, will reassess postpartum if still present. -No action required at this time.  Patient Instructions  Medication Instructions:  Your physician recommends that you continue on  your current medications as directed. Please refer to the Current Medication list given to you today.  *If you need a refill on your cardiac medications before your next appointment, please call your pharmacy*    Follow-Up: At [facility], you and your health needs are our priority.  As part of our continuing mission to provide you with exceptional heart care, we have created designated Provider Care Teams.  These Care Teams include your primary Cardiologist (physician) and Advanced Practice Providers (APPs -  Physician Assistants and Nurse Practitioners) who all work together to provide you with the care you need, when you need it.   Your next appointment:   4 week(s)  Provider:   Thomasene Ripple, DO     Other Instructions:  Please take your blood pressure daily and send in a MyChart message. Please include heart rates. (One message at the end of each week).   HOW TO TAKE YOUR BLOOD PRESSURE: Rest 5 minutes before taking your blood pressure. Don't smoke or drink caffeinated beverages for at least 30 minutes before. Take your blood pressure before (not after) you eat. Sit comfortably with your back supported and both feet on the floor (don't cross your legs). Elevate your arm to heart level on a table or a desk. Use the proper sized cuff. It should fit smoothly and snugly around your bare upper arm. There should be enough room to slip a fingertip under the cuff. The bottom edge of the cuff should be 1 inch above the crease of the elbow. Ideally, take 3 measurements at one sitting and record the average.      Dispo:  Return in about 4 weeks (around 04/03/2023).   Medication Adjustments/Labs and Tests Ordered: Current medicines are reviewed at length with the patient today.  Concerns regarding medicines are outlined above.  Tests Ordered: Orders Placed This Encounter  Procedures   EKG 12-Lead  Medication Changes: No orders of the defined types were placed in this  encounter.

## 2023-03-06 NOTE — Progress Notes (Signed)
   PRENATAL VISIT NOTE  Subjective:  Julia Potter is a 33 y.o. [phone] at [redacted]w[redacted]d being seen today for ongoing prenatal care.  She is currently monitored for the following issues for this high-risk pregnancy and has Psychiatric illness; Hirsutism; Chronic hypertension affecting pregnancy; Obesity affecting pregnancy; Herpes; Rh negative state in antepartum period; History of preterm delivery due to severe preeclampsia; History of delayed postpartum hemorrhage; History of severe pre-eclampsia; IUGR (intrauterine growth restriction) affecting care of mother, second trimester; and Supervision of high risk pregnancy, antepartum on their problem list.  Patient doing well with no acute concerns today. She reports  continue left knee pain from a prior injury .  Contractions: Not present. Vag. Bleeding: None.  Movement: Present. Denies leaking of fluid.   Dr. Bartholomew Boards came to evaluate left knee.  Meniscus injury noted from imaging.  Ortho will not operate on the knee while patient is pregnant.  The following portions of the patient's history were reviewed and updated as appropriate: allergies, current medications, past family history, past medical history, past social history, past surgical history and problem list. Problem list updated.  Objective:   Vitals:   03/06/23 1321  BP: 127/80  Pulse: (!) 103  Weight: 223 lb (101.2 kg)    Fetal Status: Fetal Heart Rate (bpm): 140 Fundal Height: 27 cm Movement: Present     General:  Alert, oriented and cooperative. Patient is in no acute distress.  Skin: Skin is warm and dry. No rash noted.   Cardiovascular: Normal heart rate noted  Respiratory: Normal respiratory effort, no problems with respiration noted  Abdomen: Soft, gravid, appropriate for gestational age.  Pain/Pressure: Absent     Pelvic: Cervical exam deferred        Extremities: Normal range of motion.  Edema: None  Mental Status:  Normal mood and affect. Normal behavior. Normal  judgment and thought content.   Assessment and Plan:  Pregnancy: [id number] at [redacted]w[redacted]d  1. [redacted] weeks gestation of pregnancy (Primary)   2. Chronic hypertension affecting pregnancy Pt saw Dr. Bobbye Charleston today, BP WNL currently with meds.  Weekly testing at 32 weeks  3. Herpes Prophylaxis at 36 weeks  4. History of delayed postpartum hemorrhage TXA and other considerations at time of delivery  5. History of severe pre-eclampsia No s/sx of preeclampsia.  Continue baby ASA  6. Obesity affecting pregnancy in second trimester, unspecified obesity type   7. Rh negative state in antepartum period Rhogam at time of 2 hour GTT  8. Supervision of high risk pregnancy, antepartum Continue routine prenatal care.  Pt advised to follow up with ortho for further considerations on treatment.  FP recommended a knee brace  and tylenol if possible.  Preterm labor symptoms and general obstetric precautions including but not limited to vaginal bleeding, contractions, leaking of fluid and fetal movement were reviewed in detail with the patient.  Please refer to After Visit Summary for other counseling recommendations.   Return in about 2 weeks (around 03/20/2023) for [facility], in person, 2 hr GTT, 3rd trim labs.   Mariel Aloe, MD Faculty Attending Center for [facility]

## 2023-03-06 NOTE — Patient Instructions (Signed)
Medication Instructions:  Your physician recommends that you continue on your current medications as directed. Please refer to the Current Medication list given to you today.  *If you need a refill on your cardiac medications before your next appointment, please call your pharmacy*    Follow-Up: At [facility], you and your health needs are our priority.  As part of our continuing mission to provide you with exceptional heart care, we have created designated Provider Care Teams.  These Care Teams include your primary Cardiologist (physician) and Advanced Practice Providers (APPs -  Physician Assistants and Nurse Practitioners) who all work together to provide you with the care you need, when you need it.   Your next appointment:   4 week(s)  Provider:   Thomasene Ripple, DO     Other Instructions:  Please take your blood pressure daily and send in a MyChart message. Please include heart rates. (One message at the end of each week).   HOW TO TAKE YOUR BLOOD PRESSURE: Rest 5 minutes before taking your blood pressure. Don't smoke or drink caffeinated beverages for at least 30 minutes before. Take your blood pressure before (not after) you eat. Sit comfortably with your back supported and both feet on the floor (don't cross your legs). Elevate your arm to heart level on a table or a desk. Use the proper sized cuff. It should fit smoothly and snugly around your bare upper arm. There should be enough room to slip a fingertip under the cuff. The bottom edge of the cuff should be 1 inch above the crease of the elbow. Ideally, take 3 measurements at one sitting and record the average.

## 2023-03-13 ENCOUNTER — Ambulatory Visit: Payer: Medicaid Other

## 2023-03-13 ENCOUNTER — Other Ambulatory Visit: Payer: Medicaid Other

## 2023-03-13 ENCOUNTER — Other Ambulatory Visit: Payer: Self-pay

## 2023-03-13 VITALS — BP 121/74 | HR 104 | Wt 224.9 lb

## 2023-03-13 DIAGNOSIS — O0990 Supervision of high risk pregnancy, unspecified, unspecified trimester: Secondary | ICD-10-CM

## 2023-03-13 DIAGNOSIS — Z6791 Unspecified blood type, Rh negative: Secondary | ICD-10-CM

## 2023-03-13 DIAGNOSIS — Z3A27 27 weeks gestation of pregnancy: Secondary | ICD-10-CM

## 2023-03-13 DIAGNOSIS — O26892 Other specified pregnancy related conditions, second trimester: Secondary | ICD-10-CM | POA: Diagnosis not present

## 2023-03-13 DIAGNOSIS — O0992 Supervision of high risk pregnancy, unspecified, second trimester: Secondary | ICD-10-CM

## 2023-03-13 DIAGNOSIS — O26899 Other specified pregnancy related conditions, unspecified trimester: Secondary | ICD-10-CM | POA: Diagnosis not present

## 2023-03-13 MED ORDER — RHO D IMMUNE GLOBULIN 1500 UNIT/2ML IJ SOSY
300.0000 ug | PREFILLED_SYRINGE | Freq: Once | INTRAMUSCULAR | Status: AC
  Administered 2023-03-13: 300 ug via INTRAMUSCULAR

## 2023-03-13 NOTE — Progress Notes (Signed)
 Julia Potter here for Rhogam Injection. GTT started and labs collected including antibody screen. Rhogam injection given. Reports intermittent lower abdominal cramping and mild pelvic pressure. Reports symptoms are present on busier days. States she is very active caring for 33 year old. Denies any symptoms of UTI or vaginal infection. Return precautions reviewed for worsening or new symptoms. Will return next week for routine OB.  Fonda Hymen, RN 03/13/2023  10:25 AM

## 2023-03-14 LAB — CBC
Hematocrit: 38.1 % (ref 34.0–46.6)
Hemoglobin: 13.1 g/dL (ref 11.1–15.9)
MCH: 32.7 pg (ref 26.6–33.0)
MCHC: 34.4 g/dL (ref 31.5–35.7)
MCV: 95 fL (ref 79–97)
Platelets: 414 10*3/uL (ref 150–450)
RBC: 4.01 x10E6/uL (ref 3.77–5.28)
RDW: 12.6 % (ref 11.7–15.4)
WBC: 9 10*3/uL (ref 3.4–10.8)

## 2023-03-14 LAB — ANTIBODY SCREEN: Antibody Screen: NEGATIVE

## 2023-03-14 LAB — GLUCOSE TOLERANCE, 2 HOURS W/ 1HR
Glucose, 1 hour: 114 mg/dL (ref 70–179)
Glucose, 2 hour: 99 mg/dL (ref 70–152)
Glucose, Fasting: 64 mg/dL — ABNORMAL LOW (ref 70–91)

## 2023-03-14 LAB — HIV ANTIBODY (ROUTINE TESTING W REFLEX): HIV Screen 4th Generation wRfx: NONREACTIVE

## 2023-03-14 LAB — RPR: RPR Ser Ql: NONREACTIVE

## 2023-03-20 ENCOUNTER — Ambulatory Visit: Payer: Medicaid Other | Admitting: *Deleted

## 2023-03-20 ENCOUNTER — Ambulatory Visit: Payer: Medicaid Other | Admitting: Obstetrics & Gynecology

## 2023-03-20 ENCOUNTER — Ambulatory Visit: Payer: Medicaid Other | Attending: Obstetrics and Gynecology

## 2023-03-20 VITALS — BP 119/81 | HR 108 | Wt 229.0 lb

## 2023-03-20 VITALS — BP 141/74 | HR 111

## 2023-03-20 DIAGNOSIS — O10919 Unspecified pre-existing hypertension complicating pregnancy, unspecified trimester: Secondary | ICD-10-CM

## 2023-03-20 DIAGNOSIS — E669 Obesity, unspecified: Secondary | ICD-10-CM | POA: Diagnosis not present

## 2023-03-20 DIAGNOSIS — O26899 Other specified pregnancy related conditions, unspecified trimester: Secondary | ICD-10-CM

## 2023-03-20 DIAGNOSIS — O0990 Supervision of high risk pregnancy, unspecified, unspecified trimester: Secondary | ICD-10-CM | POA: Insufficient documentation

## 2023-03-20 DIAGNOSIS — O09293 Supervision of pregnancy with other poor reproductive or obstetric history, third trimester: Secondary | ICD-10-CM

## 2023-03-20 DIAGNOSIS — Z3A28 28 weeks gestation of pregnancy: Secondary | ICD-10-CM

## 2023-03-20 DIAGNOSIS — O10913 Unspecified pre-existing hypertension complicating pregnancy, third trimester: Secondary | ICD-10-CM | POA: Diagnosis not present

## 2023-03-20 DIAGNOSIS — O0993 Supervision of high risk pregnancy, unspecified, third trimester: Secondary | ICD-10-CM

## 2023-03-20 DIAGNOSIS — O99213 Obesity complicating pregnancy, third trimester: Secondary | ICD-10-CM

## 2023-03-20 DIAGNOSIS — O26893 Other specified pregnancy related conditions, third trimester: Secondary | ICD-10-CM

## 2023-03-20 DIAGNOSIS — Z23 Encounter for immunization: Secondary | ICD-10-CM

## 2023-03-20 DIAGNOSIS — O09213 Supervision of pregnancy with history of pre-term labor, third trimester: Secondary | ICD-10-CM

## 2023-03-20 DIAGNOSIS — O10013 Pre-existing essential hypertension complicating pregnancy, third trimester: Secondary | ICD-10-CM | POA: Diagnosis not present

## 2023-03-20 DIAGNOSIS — Z8759 Personal history of other complications of pregnancy, childbirth and the puerperium: Secondary | ICD-10-CM

## 2023-03-20 DIAGNOSIS — Z6791 Unspecified blood type, Rh negative: Secondary | ICD-10-CM | POA: Diagnosis not present

## 2023-03-20 NOTE — Progress Notes (Signed)
   PRENATAL VISIT NOTE  Subjective:  Julia Potter is a 33 y.o. [phone] at [redacted]w[redacted]d being seen today for ongoing prenatal care.  She is currently monitored for the following issues for this high-risk pregnancy and has Psychiatric illness; Hirsutism; Chronic hypertension affecting pregnancy; Obesity affecting pregnancy; Herpes; Rh negative state in antepartum period; History of preterm delivery due to severe preeclampsia; History of delayed postpartum hemorrhage; History of severe pre-eclampsia; and Supervision of high risk pregnancy, antepartum on their problem list.  Patient reports no complaints.  Contractions: Irritability. Vag. Bleeding: None.  Movement: Present. Denies leaking of fluid.   The following portions of the patient's history were reviewed and updated as appropriate: allergies, current medications, past family history, past medical history, past social history, past surgical history and problem list.   Objective:   Vitals:   03/20/23 1100  BP: 119/81  Pulse: (!) 108  Weight: 229 lb (103.9 kg)    Fetal Status: Fetal Heart Rate (bpm): 142   Movement: Present     General:  Alert, oriented and cooperative. Patient is in no acute distress.  Skin: Skin is warm and dry. No rash noted.   Cardiovascular: Normal heart rate noted  Respiratory: Normal respiratory effort, no problems with respiration noted  Abdomen: Soft, gravid, appropriate for gestational age.  Pain/Pressure: Present (pressure)     Pelvic: Cervical exam deferred        Extremities: Normal range of motion.  Edema: None  Mental Status: Normal mood and affect. Normal behavior. Normal judgment and thought content.   Assessment and Plan:  Pregnancy: [id number] at [redacted]w[redacted]d 1. Supervision of high risk pregnancy, antepartum (Primary)  - Tdap vaccine greater than or equal to 7yo IM  2. Chronic hypertension affecting pregnancy Good control  3. Rh negative state in antepartum period S/p RhoGam 03/13/23  4. History of  severe pre-eclampsia   Preterm labor symptoms and general obstetric precautions including but not limited to vaginal bleeding, contractions, leaking of fluid and fetal movement were reviewed in detail with the patient. Please refer to After Visit Summary for other counseling recommendations.   Return in about 2 weeks (around 04/03/2023).  Future Appointments  Date Time Provider Department Center  04/03/2023  2:35 PM Federico Flake, MD [facility]  04/17/2023  8:15 AM [facility] NURSE [facility]  04/17/2023  8:30 AM [facility] US2 [facility]  04/17/2023 10:20 AM Tobb, Kardie, DO [facility] None  04/24/2023  8:15 AM [facility] NURSE [facility]  04/24/2023  8:30 AM [facility] US4 [facility] [employer] [facility]  05/01/2023  8:15 AM [facility] NURSE [facility]  05/01/2023  8:30 AM [facility] US4 [facility]  05/08/2023  8:15 AM [facility] NURSE [facility]  05/08/2023  8:30 AM [facility] US2 [facility]  05/15/2023  8:15 AM [facility] NURSE [facility]  05/15/2023  8:30 AM [facility] US2 [facility]    Scheryl Darter, MD

## 2023-03-26 ENCOUNTER — Other Ambulatory Visit: Payer: Self-pay | Admitting: Family Medicine

## 2023-03-27 ENCOUNTER — Other Ambulatory Visit: Payer: Self-pay | Admitting: *Deleted

## 2023-03-27 DIAGNOSIS — O09299 Supervision of pregnancy with other poor reproductive or obstetric history, unspecified trimester: Secondary | ICD-10-CM

## 2023-03-27 DIAGNOSIS — O10913 Unspecified pre-existing hypertension complicating pregnancy, third trimester: Secondary | ICD-10-CM

## 2023-03-27 DIAGNOSIS — O35EXX0 Maternal care for other (suspected) fetal abnormality and damage, fetal genitourinary anomalies, not applicable or unspecified: Secondary | ICD-10-CM

## 2023-03-27 DIAGNOSIS — O99213 Obesity complicating pregnancy, third trimester: Secondary | ICD-10-CM

## 2023-03-30 ENCOUNTER — Telehealth: Payer: Self-pay | Admitting: Lactation Services

## 2023-03-30 NOTE — Telephone Encounter (Signed)
 Received refill request dated 03/28/2023 for refills for Valtrex. Medication reordered per standing protocol.

## 2023-03-31 MED ORDER — VALACYCLOVIR HCL 500 MG PO TABS: ORAL_TABLET | ORAL | 99 refills | Status: AC

## 2023-04-03 ENCOUNTER — Other Ambulatory Visit: Payer: Self-pay

## 2023-04-03 ENCOUNTER — Ambulatory Visit: Payer: Medicaid Other | Admitting: Family Medicine

## 2023-04-03 ENCOUNTER — Other Ambulatory Visit (HOSPITAL_COMMUNITY)
Admission: RE | Admit: 2023-04-03 | Discharge: 2023-04-03 | Disposition: A | Discharge status: Home / Self Care | Source: Ambulatory Visit | Attending: Family Medicine | Admitting: Family Medicine

## 2023-04-03 VITALS — BP 140/86 | HR 111 | Wt 235.4 lb

## 2023-04-03 DIAGNOSIS — Z8751 Personal history of pre-term labor: Secondary | ICD-10-CM

## 2023-04-03 DIAGNOSIS — Z6791 Unspecified blood type, Rh negative: Secondary | ICD-10-CM

## 2023-04-03 DIAGNOSIS — Z3A30 30 weeks gestation of pregnancy: Secondary | ICD-10-CM

## 2023-04-03 DIAGNOSIS — O35EXX0 Maternal care for other (suspected) fetal abnormality and damage, fetal genitourinary anomalies, not applicable or unspecified: Secondary | ICD-10-CM

## 2023-04-03 DIAGNOSIS — O10913 Unspecified pre-existing hypertension complicating pregnancy, third trimester: Secondary | ICD-10-CM | POA: Diagnosis not present

## 2023-04-03 DIAGNOSIS — O0990 Supervision of high risk pregnancy, unspecified, unspecified trimester: Secondary | ICD-10-CM

## 2023-04-03 DIAGNOSIS — O99213 Obesity complicating pregnancy, third trimester: Secondary | ICD-10-CM

## 2023-04-03 DIAGNOSIS — O10919 Unspecified pre-existing hypertension complicating pregnancy, unspecified trimester: Secondary | ICD-10-CM

## 2023-04-03 DIAGNOSIS — N898 Other specified noninflammatory disorders of vagina: Secondary | ICD-10-CM

## 2023-04-03 DIAGNOSIS — O26893 Other specified pregnancy related conditions, third trimester: Secondary | ICD-10-CM | POA: Diagnosis present

## 2023-04-03 DIAGNOSIS — O0993 Supervision of high risk pregnancy, unspecified, third trimester: Secondary | ICD-10-CM

## 2023-04-03 HISTORY — DX: Maternal care for other (suspected) fetal abnormality and damage, fetal genitourinary anomalies, not applicable or unspecified: O35.EXX0

## 2023-04-03 NOTE — Progress Notes (Unsigned)
 PRENATAL VISIT NOTE  Subjective:  Julia Potter is a 33 y.o. [phone] at [redacted]w[redacted]d being seen today for ongoing prenatal care.  She is currently monitored for the following issues for this high-risk pregnancy and has Psychiatric illness; Hirsutism; Chronic hypertension affecting pregnancy; Obesity affecting pregnancy; Herpes; Rh negative state in antepartum period; History of preterm delivery due to severe preeclampsia; History of delayed postpartum hemorrhage; History of severe pre-eclampsia; Supervision of high risk pregnancy, antepartum; and Concern for right renal agnesis, pending confirmation @32wk  [place] on their problem list.  Patient reports no complaints.  Contractions: Irregular. Vag. Bleeding: None.  Movement: Present. Denies leaking of fluid.   The following portions of the patient's history were reviewed and updated as appropriate: allergies, current medications, past family history, past medical history, past social history, past surgical history and problem list.   Objective:   Vitals:   04/03/23 1507 04/03/23 1551  BP: (!) 143/94 (!) 140/86  Pulse: (!) 108 (!) 111  Weight: 235 lb 6.4 oz (106.8 kg)     Fetal Status: Fetal Heart Rate (bpm): 130   Movement: Present     General:  Alert, oriented and cooperative. Patient is in no acute distress.  Skin: Skin is warm and dry. No rash noted.   Cardiovascular: Normal heart rate noted  Respiratory: Normal respiratory effort, no problems with respiration noted  Abdomen: Soft, gravid, appropriate for gestational age.  Pain/Pressure: Present     Pelvic: Cervical exam performed in the presence of a chaperone      no pooling, thick white discharge present.  Extremities: Normal range of motion.  Edema: None  Mental Status: Normal mood and affect. Normal behavior. Normal judgment and thought content.   Assessment and Plan:  Pregnancy: [id number] at [redacted]w[redacted]d  1. Chronic hypertension affecting pregnancy (Primary) Elevated BP today- including on  repeat. Previous visit was WNL. Has history of PEC CBC, CMP and UPC collected  2. History of preterm delivery due to severe preeclampsia BP is mild range today  3. Rh negative state in antepartum period S/p rhogam 2/10  4. Obesity affecting pregnancy in third trimester, unspecified obesity type TWG=60 lb 6.4 oz (27.4 kg) which is well above goal for pregnancy  5. Concern for right renal agnesis, pending confirmation @32wk  [place] Has repeat US at 32wk with MFM  6. Supervision of high risk pregnancy, antepartum Up to date FH appropriate Vigorous movement C/o vaginal discharge and "leaking fluid." No pooling on exam and FERN negative. Recommended MAU visit if continued or worsening for ROM plus. Collect BV/yeast swab.   Preterm labor symptoms and general obstetric precautions including but not limited to vaginal bleeding, contractions, leaking of fluid and fetal movement were reviewed in detail with the patient. Please refer to After Visit Summary for other counseling recommendations.   No follow-ups on file.  Future Appointments  Date Time Provider Department Center  04/17/2023  8:15 AM [facility] NURSE [facility]  04/17/2023  8:30 AM [facility] US2 [facility]  04/17/2023 10:20 AM Tobb, Kardie, DO [facility] None  04/24/2023  8:15 AM [facility] NURSE [facility]  04/24/2023  8:30 AM [facility] US4 [facility]  05/01/2023  8:15 AM [facility] NURSE [facility]  05/01/2023  8:30 AM [facility] US4 [facility]  05/08/2023  8:15 AM [facility] NURSE [facility]  05/08/2023  8:30 AM [facility] US2 [facility]  05/15/2023  8:15 AM [facility] NURSE [facility]  05/15/2023  8:30 AM [facility] US2 [facility]    Federico Flake, MD

## 2023-04-04 ENCOUNTER — Encounter: Payer: Self-pay | Admitting: Family Medicine

## 2023-04-04 LAB — CERVICOVAGINAL ANCILLARY ONLY
Bacterial Vaginitis (gardnerella): NEGATIVE
Candida Glabrata: NEGATIVE
Candida Vaginitis: NEGATIVE
Comment: NEGATIVE
Comment: NEGATIVE
Comment: NEGATIVE

## 2023-04-04 LAB — POCT FERNING: Ferning, POC: NEGATIVE

## 2023-04-05 LAB — COMPREHENSIVE METABOLIC PANEL
ALT: 10 IU/L (ref 0–32)
AST: 11 IU/L (ref 0–40)
Albumin: 3.6 g/dL — ABNORMAL LOW (ref 4.0–5.0)
Alkaline Phosphatase: 114 IU/L (ref 44–121)
BUN/Creatinine Ratio: 9 (ref 9–23)
BUN: 5 mg/dL — ABNORMAL LOW (ref 6–20)
Bilirubin Total: <0.2 mg/dL (ref 0.0–1.2)
CO2: 19 mmol/L — ABNORMAL LOW (ref 20–29)
Calcium: 9.4 mg/dL (ref 8.7–10.2)
Chloride: 102 mmol/L (ref 96–106)
Creatinine, Ser: 0.54 mg/dL — ABNORMAL LOW (ref 0.57–1.00)
Globulin, Total: 3 g/dL (ref 1.5–4.5)
Glucose: 85 mg/dL (ref 70–99)
Potassium: 4.3 mmol/L (ref 3.5–5.2)
Sodium: 138 mmol/L (ref 134–144)
Total Protein: 6.6 g/dL (ref 6.0–8.5)
eGFR: 127 mL/min/{1.73_m2} (ref >59)

## 2023-04-05 LAB — CBC
Hematocrit: 37.6 % (ref 34.0–46.6)
Hemoglobin: 12.7 g/dL (ref 11.1–15.9)
MCH: 31.5 pg (ref 26.6–33.0)
MCHC: 33.8 g/dL (ref 31.5–35.7)
MCV: 93 fL (ref 79–97)
Platelets: 366 10*3/uL (ref 150–450)
RBC: 4.03 x10E6/uL (ref 3.77–5.28)
RDW: 12.6 % (ref 11.7–15.4)
WBC: 9.8 10*3/uL (ref 3.4–10.8)

## 2023-04-05 LAB — PROTEIN / CREATININE RATIO, URINE
Creatinine, Urine: 51.8 mg/dL
Protein, Ur: 17.9 mg/dL
Protein/Creat Ratio: 346 mg/g{creat} — ABNORMAL HIGH (ref 0–200)

## 2023-04-06 ENCOUNTER — Encounter: Payer: Self-pay | Admitting: Family Medicine

## 2023-04-06 ENCOUNTER — Telehealth: Payer: Self-pay | Admitting: Family Medicine

## 2023-04-06 ENCOUNTER — Encounter: Payer: Self-pay | Admitting: Cardiology

## 2023-04-06 DIAGNOSIS — O1403 Mild to moderate pre-eclampsia, third trimester: Secondary | ICD-10-CM | POA: Insufficient documentation

## 2023-04-06 NOTE — Telephone Encounter (Signed)
 Patient would like a call regarding her test results asap she is concerned

## 2023-04-07 ENCOUNTER — Ambulatory Visit

## 2023-04-07 ENCOUNTER — Telehealth: Payer: Self-pay

## 2023-04-07 DIAGNOSIS — O1403 Mild to moderate pre-eclampsia, third trimester: Secondary | ICD-10-CM

## 2023-04-07 NOTE — Telephone Encounter (Signed)
 Patient notified of results by provider over MyChart. Appropriate follow up has been scheduled and patient is aware.

## 2023-04-07 NOTE — Telephone Encounter (Addendum)
-----   Message from Federico Flake sent at 04/06/2023  4:04 PM EST ----- Please schedule for  BPP at [facility] with RN BP check next week (ideally Monday). Also needs every other week provider visits (looks like only one is scheduled with [facility]).   UPC elevated, has Preeclampsia without severe features at this point, needs delivery at 37 week. Please review PEC symptoms again   -------------   BPP and BP check with RN scheduled for Monday, 04/10/23. Called pt; VM left stating I am calling regarding new appts for Monday. MyChart message sent.

## 2023-04-10 ENCOUNTER — Ambulatory Visit

## 2023-04-10 ENCOUNTER — Other Ambulatory Visit: Payer: Self-pay

## 2023-04-10 VITALS — BP 135/74 | HR 118 | Wt 234.1 lb

## 2023-04-10 DIAGNOSIS — Z3A31 31 weeks gestation of pregnancy: Secondary | ICD-10-CM | POA: Diagnosis not present

## 2023-04-10 DIAGNOSIS — O1403 Mild to moderate pre-eclampsia, third trimester: Secondary | ICD-10-CM

## 2023-04-10 DIAGNOSIS — O0990 Supervision of high risk pregnancy, unspecified, unspecified trimester: Secondary | ICD-10-CM | POA: Diagnosis not present

## 2023-04-10 DIAGNOSIS — Z0130 Encounter for examination of blood pressure without abnormal findings: Secondary | ICD-10-CM

## 2023-04-10 NOTE — Progress Notes (Signed)
 Blood Pressure Check Visit  Julia Potter is here for blood pressure check at [redacted]w[redacted]d of pregnancy with new diagnosis of pre-eclampsia. Checking BP at home, last 2 days: 139/80, 134/74 with heart rate around 105. BP today is 149/88, recheck 135/74. HR 120. No headache, vision change, RUQ pain. New +1 bilateral lower edema. Reviewed with Alvester Morin, MD who gives verbal order for CBC, CMP, protein/creatinine ratio. Reviewed return precautions with patient. Labs collected. Pt to ultrasound for BPP.   Marjo Bicker, RN 04/10/2023  8:51 AM

## 2023-04-11 LAB — COMPREHENSIVE METABOLIC PANEL
ALT: 10 IU/L (ref 0–32)
AST: 14 IU/L (ref 0–40)
Albumin: 3.2 g/dL — ABNORMAL LOW (ref 4.0–5.0)
Alkaline Phosphatase: 119 IU/L (ref 44–121)
BUN/Creatinine Ratio: 10 (ref 9–23)
BUN: 5 mg/dL — ABNORMAL LOW (ref 6–20)
Bilirubin Total: 0.3 mg/dL (ref 0.0–1.2)
CO2: 16 mmol/L — ABNORMAL LOW (ref 20–29)
Calcium: 8.9 mg/dL (ref 8.7–10.2)
Chloride: 105 mmol/L (ref 96–106)
Creatinine, Ser: 0.51 mg/dL — ABNORMAL LOW (ref 0.57–1.00)
Globulin, Total: 3.4 g/dL (ref 1.5–4.5)
Glucose: 99 mg/dL (ref 70–99)
Potassium: 4 mmol/L (ref 3.5–5.2)
Sodium: 137 mmol/L (ref 134–144)
Total Protein: 6.6 g/dL (ref 6.0–8.5)
eGFR: 129 mL/min/{1.73_m2} (ref >59)

## 2023-04-11 LAB — CBC
Hematocrit: 37.3 % (ref 34.0–46.6)
Hemoglobin: 12.5 g/dL (ref 11.1–15.9)
MCH: 31.3 pg (ref 26.6–33.0)
MCHC: 33.5 g/dL (ref 31.5–35.7)
MCV: 94 fL (ref 79–97)
Platelets: 374 10*3/uL (ref 150–450)
RBC: 3.99 x10E6/uL (ref 3.77–5.28)
RDW: 12.5 % (ref 11.7–15.4)
WBC: 8.1 10*3/uL (ref 3.4–10.8)

## 2023-04-11 LAB — PROTEIN / CREATININE RATIO, URINE
Creatinine, Urine: 63.9 mg/dL
Protein, Ur: 13.3 mg/dL
Protein/Creat Ratio: 208 mg/g{creat} — ABNORMAL HIGH (ref 0–200)

## 2023-04-17 ENCOUNTER — Other Ambulatory Visit: Payer: Self-pay

## 2023-04-17 ENCOUNTER — Ambulatory Visit: Payer: Medicaid Other | Attending: Obstetrics and Gynecology

## 2023-04-17 ENCOUNTER — Encounter: Payer: Self-pay | Admitting: Cardiology

## 2023-04-17 ENCOUNTER — Ambulatory Visit (INDEPENDENT_AMBULATORY_CARE_PROVIDER_SITE_OTHER): Payer: Medicaid Other | Admitting: Cardiology

## 2023-04-17 ENCOUNTER — Ambulatory Visit: Admitting: Maternal & Fetal Medicine

## 2023-04-17 ENCOUNTER — Ambulatory Visit: Admitting: Obstetrics and Gynecology

## 2023-04-17 ENCOUNTER — Ambulatory Visit: Payer: Medicaid Other

## 2023-04-17 VITALS — BP 138/79 | HR 109 | Wt 239.8 lb

## 2023-04-17 VITALS — BP 136/82 | HR 123 | Ht 62.0 in | Wt 239.4 lb

## 2023-04-17 VITALS — BP 133/86 | HR 124

## 2023-04-17 DIAGNOSIS — O99213 Obesity complicating pregnancy, third trimester: Secondary | ICD-10-CM | POA: Diagnosis not present

## 2023-04-17 DIAGNOSIS — O0990 Supervision of high risk pregnancy, unspecified, unspecified trimester: Secondary | ICD-10-CM

## 2023-04-17 DIAGNOSIS — O35EXX0 Maternal care for other (suspected) fetal abnormality and damage, fetal genitourinary anomalies, not applicable or unspecified: Secondary | ICD-10-CM | POA: Diagnosis not present

## 2023-04-17 DIAGNOSIS — O113 Pre-existing hypertension with pre-eclampsia, third trimester: Secondary | ICD-10-CM

## 2023-04-17 DIAGNOSIS — Z6791 Unspecified blood type, Rh negative: Secondary | ICD-10-CM | POA: Diagnosis not present

## 2023-04-17 DIAGNOSIS — Z3A32 32 weeks gestation of pregnancy: Secondary | ICD-10-CM | POA: Insufficient documentation

## 2023-04-17 DIAGNOSIS — O10013 Pre-existing essential hypertension complicating pregnancy, third trimester: Secondary | ICD-10-CM

## 2023-04-17 DIAGNOSIS — O26893 Other specified pregnancy related conditions, third trimester: Secondary | ICD-10-CM

## 2023-04-17 DIAGNOSIS — O1493 Unspecified pre-eclampsia, third trimester: Secondary | ICD-10-CM | POA: Diagnosis not present

## 2023-04-17 DIAGNOSIS — O0993 Supervision of high risk pregnancy, unspecified, third trimester: Secondary | ICD-10-CM | POA: Diagnosis not present

## 2023-04-17 DIAGNOSIS — O10919 Unspecified pre-existing hypertension complicating pregnancy, unspecified trimester: Secondary | ICD-10-CM

## 2023-04-17 DIAGNOSIS — E669 Obesity, unspecified: Secondary | ICD-10-CM | POA: Diagnosis not present

## 2023-04-17 DIAGNOSIS — O1403 Mild to moderate pre-eclampsia, third trimester: Secondary | ICD-10-CM | POA: Diagnosis present

## 2023-04-17 NOTE — Patient Instructions (Signed)
 Medication Instructions:  Your physician recommends that you continue on your current medications as directed. Please refer to the Current Medication list given to you today.  *If you need a refill on your cardiac medications before your next appointment, please call your pharmacy*  Follow-Up: At [facility], you and your health needs are our priority.  As part of our continuing mission to provide you with exceptional heart care, we have created designated Provider Care Teams.  These Care Teams include your primary Cardiologist (physician) and Advanced Practice Providers (APPs -  Physician Assistants and Nurse Practitioners) who all work together to provide you with the care you need, when you need it.   Your next appointment:   2-3 week(s) overbook  Provider:   Thomasene Ripple, DO

## 2023-04-17 NOTE — Progress Notes (Signed)
 Cardio-Obstetrics Clinic  Follow Up Note   Date:  04/17/2023   ID:  Julia Potter, DOB 11/10/1990, MRN [id number]  PCP:  Garnette Gunner, MD   [facility] Providers Cardiologist:  Thomasene Ripple, DO  Electrophysiologist:  None        Referring MD: Garnette Gunner, MD   Chief Complaint: I am doing okay  History of Present Illness:    Julia Potter is a 33 y.o. female [[id number]] who returns for follow up of cardiovascular care in pregnancy.  At her last visit we will place a monitor on the patient.  Her monitor did come back did not show any evidence of arrhythmias.  Medical hx includes chronic hypertension and hx of preclampsia  She reports that her blood pressure has been 'running in the one thirty eight.' She is being seen weekly at the [facility]. She expresses interest in participating in a study for postpartum hypertension in black women. She reports that prior to pregnancy, she was on a 'healthy journey' and did not have hypertension. However, she develops hypertension with each pregnancy. She is currently in her third trimester.  She is currently [redacted] weeks pregnant 6 days.   Prior CV Studies Reviewed: The following studies were reviewed today: ZIO monitor  Past Medical History:  Diagnosis Date   Abnormal uterine bleeding (AUB) 01/20/2016   ALLERGIC RHINITIS    Anemia, postpartum 10/12/2019   Anxiety    no meds   Chlamydia    Chronic hypertension in pregnancy 09/29/2019   Chronic hypertension with superimposed severe preeclampsia 10/09/2019   Depression    doing ok, in therapy - no meds   Herpes    History of multiple miscarriages    History of PCOS 04/25/2019   HSV infection    Preeclampsia 2010   resolved after pregnancy   Rh negative state in antepartum period 05/10/2017   Seasonal allergies     Past Surgical History:  Procedure Laterality Date   DILATION AND EVACUATION N/A 05/23/2017   Procedure: DILATATION AND EVACUATION;   Surgeon: Vanleer Bing, MD;  Location: [facility];  Service: Gynecology;  Laterality: N/A;  needs [place]   INTRAUTERINE DEVICE INSERTION  09/2010   IUD REMOVAL        OB History     Gravida  6   Para  2   Term  0   Preterm  2   AB  3   Living  2      SAB  3   IAB  0   Ectopic  0   Multiple  0   Live Births  2        Obstetric Comments  G1P0101 Admitted to [facility] for PIH, IOL. Delivered a viable female at 34 weeks             Current Medications: Current Meds  Medication Sig   acetaminophen (TYLENOL) 500 MG tablet Take 1,000 mg by mouth every 6 (six) hours as needed for moderate pain (pain score 4-6).   aspirin 81 MG chewable tablet Chew 2 tablets (162 mg total) by mouth daily.   diphenhydrAMINE (BENADRYL) 25 MG tablet Take 25 mg by mouth daily as needed for allergies.   NIFEdipine (PROCARDIA-XL/NIFEDICAL-XL) 30 MG 24 hr tablet Take 1 tablet (30 mg total) by mouth daily.   Prenatal Multivit-Min-FA ([employer] PRENATAL GUMMY) 0.4 MG CHEW Chew 1 Units by mouth daily.   valACYclovir (VALTREX) 500 MG tablet Take 1 tablet (500  mg) 2 times daily for 3 days for symptoms of outbreak     Allergies:   Patient has no known allergies.   Social History   Socioeconomic History   Marital status: Single    Spouse name: Not on file   Number of children: Not on file   Years of education: Not on file   Highest education level: Some college, no degree  Occupational History   Not on file  Tobacco Use   Smoking status: Never   Smokeless tobacco: Never  Vaping Use   Vaping status: Never Used  Substance and Sexual Activity   Alcohol use: Not Currently    Comment: socially   Drug use: No   Sexual activity: Yes    Birth control/protection: None  Other Topics Concern   Not on file  Social History Narrative   Lives with mom, sister and brother   Has one daughter born in [address] [place]   Wants to go to law school   Social Drivers of Health   Financial Resource Strain: Low  Risk  (03/13/2023)   Overall Financial Resource Strain (CARDIA)    Difficulty of Paying Living Expenses: Not hard at all  Food Insecurity: No Food Insecurity (03/13/2023)   Hunger Vital Sign    Worried About Running Out of Food in the Last Year: Never true    Ran Out of Food in the Last Year: Never true  Transportation Needs: No Transportation Needs (03/13/2023)   PRAPARE - Administrator, Civil Service (Medical): No    Lack of Transportation (Non-Medical): No  Physical Activity: Insufficiently Active (03/13/2023)   Exercise Vital Sign    Days of Exercise per Week: 2 days    Minutes of Exercise per Session: 30 min  Stress: No Stress Concern Present (03/13/2023)   [employer] of Occupational Health - Occupational Stress Questionnaire    Feeling of Stress : Not at all  Social Connections: Moderately Isolated (03/13/2023)   Social Connection and Isolation Panel [NHANES]    Frequency of Communication with Friends and Family: More than three times a week    Frequency of Social Gatherings with Friends and Family: Once a week    Attends Religious Services: Never    Database administrator or Organizations: No    Attends Engineer, structural: Not on file    Marital Status: Living with partner      Family History  Problem Relation Age of Onset   Hypertension Mother    Asthma Brother    Asthma Daughter    Diabetes Maternal Grandmother    Cancer Neg Hx    Heart disease Neg Hx       ROS:   Please see the history of present illness.     All other systems reviewed and are negative.   Labs/EKG Reviewed:    EKG:  None today    Recent Labs: 12/05/2022: TSH 0.506 04/10/2023: ALT 10; BUN 5; Creatinine, Ser 0.51; Hemoglobin 12.5; Platelets 374; Potassium 4.0; Sodium 137   Recent Lipid Panel No results found for: "CHOL", "TRIG", "HDL", "CHOLHDL", "LDLCALC", "LDLDIRECT"  Physical Exam:    VS:  BP 136/82 (BP Location: Right Arm, Patient Position: Sitting, Cuff  Size: Normal)   Pulse (!) 123   Ht 5\' 2"  (1.575 m)   Wt 239 lb 6.4 oz (108.6 kg)   LMP 09/01/2022   SpO2 99%   BMI 43.79 kg/m     Wt Readings from Last 3 Encounters:  04/17/23 239 lb 6.4 oz (108.6 kg)  04/10/23 234 lb 1.6 oz (106.2 kg)  04/03/23 235 lb 6.4 oz (106.8 kg)     GEN:  Well nourished, well developed in no acute distress HEENT: Normal NECK: No JVD; No carotid bruits LYMPHATICS: No lymphadenopathy CARDIAC: RRR, no murmurs, rubs, gallops RESPIRATORY:  Clear to auscultation without rales, wheezing or rhonchi  ABDOMEN: Soft, non-tender, non-distended MUSCULOSKELETAL:  No edema; No deformity  SKIN: Warm and dry NEUROLOGIC:  Alert and oriented x 3 PSYCHIATRIC:  Normal affect    Risk Assessment/Risk Calculators:     CARPREG II Risk Prediction Index Score:  1.  The patient's risk for a primary cardiac event is 5%.            ASSESSMENT & PLAN:    Chronic hypertension in pregnancy Patient susceptible continue patient on current medication. - Monitor blood pressure at home, report readings over 140 mmHg. - Continue weekly monitoring at med center. - Follow-up in 2-3 weeks, delivery expected at 37 weeks.  She is interested in our impact mom study. - Pt meets eligibility for IMPACT study, discussed risks/benefits of the study  - Provided with information sheet.  - Aware that the research team will be in contact within 24-48hrs, followed by the Lead CHW      Patient Instructions  Medication Instructions:  Your physician recommends that you continue on your current medications as directed. Please refer to the Current Medication list given to you today.  *If you need a refill on your cardiac medications before your next appointment, please call your pharmacy*  Follow-Up: At [facility], you and your health needs are our priority.  As part of our continuing mission to provide you with exceptional heart care, we have created designated Provider Care Teams.   These Care Teams include your primary Cardiologist (physician) and Advanced Practice Providers (APPs -  Physician Assistants and Nurse Practitioners) who all work together to provide you with the care you need, when you need it.   Your next appointment:   2-3 week(s) overbook  Provider:   Thomasene Ripple, DO      Dispo:  No follow-ups on file.   Medication Adjustments/Labs and Tests Ordered: Current medicines are reviewed at length with the patient today.  Concerns regarding medicines are outlined above.  Tests Ordered: No orders of the defined types were placed in this encounter.  Medication Changes: No orders of the defined types were placed in this encounter.

## 2023-04-17 NOTE — Progress Notes (Signed)
   PRENATAL VISIT NOTE  Subjective:  Julia Potter is a 33 y.o. [phone] at [redacted]w[redacted]d being seen today for ongoing prenatal care.  She is currently monitored for the following issues for this high-risk pregnancy and has Psychiatric illness; Hirsutism; Chronic hypertension affecting pregnancy; Obesity affecting pregnancy; Herpes; Rh negative state in antepartum period; History of preterm delivery due to severe preeclampsia; History of delayed postpartum hemorrhage; History of severe pre-eclampsia; Supervision of high risk pregnancy, antepartum; Concern for right renal agnesis, pending confirmation @32wk  [place]; and Mild preeclampsia, third trimester on their problem list.  Patient reports  low appetite .  Contractions: Not present. Vag. Bleeding: None.  Movement: Present. Denies leaking of fluid.   The following portions of the patient's history were reviewed and updated as appropriate: allergies, current medications, past family history, past medical history, past social history, past surgical history and problem list.   Objective:   Vitals:   04/17/23 1440  BP: 138/79  Pulse: (!) 109  Weight: 239 lb 12.8 oz (108.8 kg)    Fetal Status: Fetal Heart Rate (bpm): 142   Movement: Present     General:  Alert, oriented and cooperative. Patient is in no acute distress.  Skin: Skin is warm and dry. No rash noted.   Cardiovascular: Normal heart rate noted  Respiratory: Normal respiratory effort, no problems with respiration noted  Abdomen: Soft, gravid, appropriate for gestational age.  Pain/Pressure: Absent     Pelvic: Cervical exam deferred        Extremities: Normal range of motion.  Edema: None  Mental Status: Normal mood and affect. Normal behavior. Normal judgment and thought content.   Assessment and Plan:  Pregnancy: [id number] at [redacted]w[redacted]d 1. Supervision of high risk pregnancy, antepartum (Primary) FHR normal Feeling regular movement    2. [redacted] weeks gestation of pregnancy  3. Rh negative  state in antepartum period S/p 2/10  4. CHTN w/ superimposed preeclampsia, third trimester Normotensive today, no PEC s&s- Dx 3/6 continue weekly testing  BPP earlier today 8/8, afi normal, efw 26% Delivery 37 weeks, continue antenatal testing Precautions discussed when to follow up    Preterm labor symptoms and general obstetric precautions including but not limited to vaginal bleeding, contractions, leaking of fluid and fetal movement were reviewed in detail with the patient. Please refer to After Visit Summary for other counseling recommendations.   Return in about 2 weeks (around 05/01/2023) for OB VISIT (MD or APP).  Future Appointments  Date Time Provider Department Center  04/24/2023  8:15 AM [facility] NURSE [facility]  04/24/2023  8:30 AM [facility] US4 [facility]  05/01/2023  8:15 AM [facility] NURSE [facility]  05/01/2023  8:30 AM [facility] US4 [facility]  05/05/2023 10:15 AM Adam Phenix, MD [facility]  05/08/2023  8:15 AM [facility] NURSE [facility]  05/08/2023  8:30 AM [facility] US2 [facility]  05/15/2023  8:15 AM [facility] NURSE [facility]  05/15/2023  8:30 AM [facility] US2 [facility]  05/18/2023 10:15 AM Warden Fillers, MD [facility]  07/11/2023 11:00 AM Tobb, Lavona Mound, DO [facility] None    Albertine Grates, FNP

## 2023-04-17 NOTE — Progress Notes (Signed)
 Patient information  Patient Name: Julia Potter  Patient MRN:   [id number]  Referring practice: MFM Referring Provider: [facility])  MFM CONSULT  Julia Potter is a 33 y.o. [phone] at [redacted]w[redacted]d here for ultrasound and consultation. Patient Active Problem List   Diagnosis Date Noted   Mild preeclampsia, third trimester 04/06/2023   Concern for right renal agnesis, pending confirmation @32wk  [place] 04/03/2023   Supervision of high risk pregnancy, antepartum 11/27/2022   History of severe pre-eclampsia 10/12/2019   History of delayed postpartum hemorrhage 10/10/2019   History of preterm delivery due to severe preeclampsia 04/25/2019   Chronic hypertension affecting pregnancy 04/18/2019   Obesity affecting pregnancy 04/18/2019   Rh negative state in antepartum period 04/18/2019   Herpes    Hirsutism 09/14/2015   Psychiatric illness 11/01/2013    Julia Potter has a pregnancy with the complications mentioned in the problem list. During today's visit we focused on the following concerns:   RE absence right kidney: There was concern on previous ultrasounds for either an absent right kidney or a pelvic kidney.  I discussed that today it appears there is no normal kidney and the renal fossa on the right.  There is no evidence of a renal artery in this area or to the pelvis.  I discussed this is most likely consistent with an absent fetal kidney on the right side.  The left kidney appears normal.  I discussed that children can live with one kidney but are at high risk for hypertension and developing end-stage renal disease later in life.  I discussed that the pediatric team will perform postnatal imaging to confirm the diagnosis as well as counsel about the long-term prognosis and follow-up.   RE [facility] with superimposed preeclampsia: Today the patient's blood pressure is 133/86.  I discussed the need for weekly labs to assess for the development of severe features.   Currently the patient denies any headache or vision changes but has noticed some "flashing lights" in the days prior that lasted a few seconds and self resolved.  I discussed the importance of taking her blood pressure and monitoring for signs and symptoms of preeclampsia and to call her OB provider or go to the ER if she has any concerns.  Sonographic findings Single intrauterine pregnancy. Fetal cardiac activity: Observed. Presentation: Cephalic. Interval fetal anatomy appears normal. Fetal biometry shows the estimated fetal weight at the 26 percentile. Amniotic fluid: Within normal limits.  MVP: 4.8 cm. Placenta: Posterior. BPP: 8/8.   Recommendations 1. Weekly antenatal testing until delivery 2. Delivery at 37 weeks or sooner if clinically indicated 3. Weekly CBC and CMP to assess for severe features 4. Postnatal renal imaging and pediatric urology consult  Review of Systems: A review of systems was performed and was negative except per HPI   Vitals and Physical Exam    04/17/2023    2:40 PM 04/17/2023   10:13 AM 04/17/2023    8:17 AM  Vitals with BMI  Height  5\' 2"    Weight 239 lbs 13 oz 239 lbs 6 oz   BMI 43.85 43.78   Systolic 138 136 147  Diastolic 79 82 86  Pulse 109 123 124    Sitting comfortably on the sonogram table Nonlabored breathing Normal rate and rhythm Abdomen is nontender  Past pregnancies OB History  Gravida Para Term Preterm AB Living  6 2 0 2 3 2   SAB IAB Ectopic Multiple Live Births  3 0 0 0 2    # Outcome Date GA Lbr Len/2nd Weight Sex Type Anes PTL Lv  6 Current           5 Preterm 10/10/19 [redacted]w[redacted]d / 00:05 5 lb 9.4 oz (2.534 kg) F Vag-Vacuum EPI  LIV  4 SAB 12/2017 [redacted]w[redacted]d         3 SAB 05/2017 [redacted]w[redacted]d    SAB        Complications: Missed ab, Triplets, all stillborn, S/P D&C (status post dilation and curettage)  2 SAB 2017             Complications: Missed abortion  1 Preterm 2011 [redacted]w[redacted]d  3 lb 12 oz (1.701 kg) F Vag-Spont None  LIV     Birth  Comments: preterm labor , IOL for  preeclampsia     Obstetric Comments  G1P0101 Admitted to [facility] for PIH, IOL. Delivered a viable female at 34 weeks    I spent 20 minutes reviewing the patients chart, including labs and images as well as counseling the patient about her medical conditions. Greater than 50% of the time was spent in direct face-to-face patient counseling.  Braxton Feathers, DO Maternal fetal medicine, [place]   04/17/2023  4:24 PM

## 2023-04-18 ENCOUNTER — Telehealth: Payer: Self-pay | Admitting: *Deleted

## 2023-04-18 ENCOUNTER — Telehealth: Payer: Self-pay

## 2023-04-18 NOTE — Telephone Encounter (Signed)
 Called pt to set up an appointment with pharmacy staff. No answer. Left a message for pt to return the call to confirm the appt on April 21st at 11am. Will send MyChart message as well.

## 2023-04-18 NOTE — Telephone Encounter (Signed)
 Left message regarding Impact Mom trial.

## 2023-04-20 ENCOUNTER — Telehealth: Payer: Self-pay | Admitting: *Deleted

## 2023-04-20 NOTE — Telephone Encounter (Signed)
 Spoke with Ms. Julia Potter. She has had the opportunity to read the IMPACT Mom information sheet provided by Dr. Servando Salina. She has no additional questions and has agreed to participate.

## 2023-04-24 ENCOUNTER — Ambulatory Visit: Payer: Medicaid Other | Admitting: *Deleted

## 2023-04-24 ENCOUNTER — Ambulatory Visit: Attending: Obstetrics | Admitting: Obstetrics

## 2023-04-24 ENCOUNTER — Ambulatory Visit: Payer: Medicaid Other | Attending: Obstetrics and Gynecology

## 2023-04-24 VITALS — BP 152/65 | HR 133

## 2023-04-24 DIAGNOSIS — O0990 Supervision of high risk pregnancy, unspecified, unspecified trimester: Secondary | ICD-10-CM

## 2023-04-24 DIAGNOSIS — O09299 Supervision of pregnancy with other poor reproductive or obstetric history, unspecified trimester: Secondary | ICD-10-CM | POA: Diagnosis present

## 2023-04-24 DIAGNOSIS — O09293 Supervision of pregnancy with other poor reproductive or obstetric history, third trimester: Secondary | ICD-10-CM | POA: Diagnosis not present

## 2023-04-24 DIAGNOSIS — O1403 Mild to moderate pre-eclampsia, third trimester: Secondary | ICD-10-CM

## 2023-04-24 DIAGNOSIS — O10013 Pre-existing essential hypertension complicating pregnancy, third trimester: Secondary | ICD-10-CM

## 2023-04-24 DIAGNOSIS — Z3A33 33 weeks gestation of pregnancy: Secondary | ICD-10-CM | POA: Diagnosis not present

## 2023-04-24 DIAGNOSIS — O119 Pre-existing hypertension with pre-eclampsia, unspecified trimester: Secondary | ICD-10-CM | POA: Diagnosis not present

## 2023-04-24 DIAGNOSIS — O99213 Obesity complicating pregnancy, third trimester: Secondary | ICD-10-CM

## 2023-04-24 DIAGNOSIS — O35EXX0 Maternal care for other (suspected) fetal abnormality and damage, fetal genitourinary anomalies, not applicable or unspecified: Secondary | ICD-10-CM | POA: Insufficient documentation

## 2023-04-24 DIAGNOSIS — O10913 Unspecified pre-existing hypertension complicating pregnancy, third trimester: Secondary | ICD-10-CM | POA: Insufficient documentation

## 2023-04-24 DIAGNOSIS — E669 Obesity, unspecified: Secondary | ICD-10-CM

## 2023-04-24 NOTE — Progress Notes (Signed)
 MFM Consult Note  Julia Potter is currently at 33 weeks and 6 days.  She has been followed due to chronic hypertension with superimposed preeclampsia.  She is currently treated with nifedipine.  She denies any problems since her last exam.  Her blood pressure today was 152/65.    An absent right kidney was suspected on her prior ultrasound exam.  A biophysical profile performed today was 8/8.   There was normal amniotic fluid noted with a total AFI of 17.23 cm.  On today's exam, a probable right pelvic kidney was noted.  The left kidney appeared in its usual position.  The patient was advised that her baby will require imaging studies of the abdomen and pelvis after birth to determine if a right pelvic kidney is present.    Should the right pelvic kidney be confirmed after birth, she will require a consultation with pediatric urology/nephrology to determine if any treatment is necessary.    The patient was reassured that as a normal-appearing filled fetal bladder along with normal amniotic fluid were noted today, that one or both of the fetal kidneys are functioning normally.  Due to chronic hypertension with superimposed preeclampsia, we will continue to follow her with weekly fetal testing until delivery.    Preeclampsia precautions were reviewed today.  Delivery should probably occur by 37 weeks.    She will return in 1 week for another BPP.    The patient stated that all of her questions were answered today.  A total of 20 minutes was spent counseling and coordinating the care for this patient.  Greater than 50% of the time was spent in direct face-to-face contact.

## 2023-04-28 ENCOUNTER — Other Ambulatory Visit: Payer: Self-pay | Admitting: Obstetrics & Gynecology

## 2023-04-28 DIAGNOSIS — O10919 Unspecified pre-existing hypertension complicating pregnancy, unspecified trimester: Secondary | ICD-10-CM

## 2023-04-28 DIAGNOSIS — Z8759 Personal history of other complications of pregnancy, childbirth and the puerperium: Secondary | ICD-10-CM

## 2023-05-01 ENCOUNTER — Ambulatory Visit: Payer: Medicaid Other | Admitting: *Deleted

## 2023-05-01 ENCOUNTER — Ambulatory Visit: Admitting: Obstetrics and Gynecology

## 2023-05-01 ENCOUNTER — Ambulatory Visit: Payer: Medicaid Other | Attending: Obstetrics and Gynecology

## 2023-05-01 ENCOUNTER — Encounter: Payer: Self-pay | Admitting: Obstetrics and Gynecology

## 2023-05-01 VITALS — BP 124/69 | HR 106 | Wt 238.6 lb

## 2023-05-01 VITALS — BP 148/84 | HR 101

## 2023-05-01 DIAGNOSIS — O10913 Unspecified pre-existing hypertension complicating pregnancy, third trimester: Secondary | ICD-10-CM | POA: Insufficient documentation

## 2023-05-01 DIAGNOSIS — O99213 Obesity complicating pregnancy, third trimester: Secondary | ICD-10-CM | POA: Insufficient documentation

## 2023-05-01 DIAGNOSIS — O10013 Pre-existing essential hypertension complicating pregnancy, third trimester: Secondary | ICD-10-CM

## 2023-05-01 DIAGNOSIS — O09293 Supervision of pregnancy with other poor reproductive or obstetric history, third trimester: Secondary | ICD-10-CM | POA: Diagnosis not present

## 2023-05-01 DIAGNOSIS — E669 Obesity, unspecified: Secondary | ICD-10-CM | POA: Diagnosis not present

## 2023-05-01 DIAGNOSIS — O1403 Mild to moderate pre-eclampsia, third trimester: Secondary | ICD-10-CM | POA: Insufficient documentation

## 2023-05-01 DIAGNOSIS — O0990 Supervision of high risk pregnancy, unspecified, unspecified trimester: Secondary | ICD-10-CM

## 2023-05-01 DIAGNOSIS — Z8751 Personal history of pre-term labor: Secondary | ICD-10-CM

## 2023-05-01 DIAGNOSIS — O09299 Supervision of pregnancy with other poor reproductive or obstetric history, unspecified trimester: Secondary | ICD-10-CM | POA: Insufficient documentation

## 2023-05-01 DIAGNOSIS — O99210 Obesity complicating pregnancy, unspecified trimester: Secondary | ICD-10-CM

## 2023-05-01 DIAGNOSIS — O35EXX0 Maternal care for other (suspected) fetal abnormality and damage, fetal genitourinary anomalies, not applicable or unspecified: Secondary | ICD-10-CM | POA: Insufficient documentation

## 2023-05-01 DIAGNOSIS — Z3A35 35 weeks gestation of pregnancy: Secondary | ICD-10-CM

## 2023-05-01 DIAGNOSIS — Z3A34 34 weeks gestation of pregnancy: Secondary | ICD-10-CM

## 2023-05-01 DIAGNOSIS — Z6791 Unspecified blood type, Rh negative: Secondary | ICD-10-CM

## 2023-05-01 DIAGNOSIS — O26893 Other specified pregnancy related conditions, third trimester: Secondary | ICD-10-CM | POA: Diagnosis not present

## 2023-05-01 DIAGNOSIS — O10919 Unspecified pre-existing hypertension complicating pregnancy, unspecified trimester: Secondary | ICD-10-CM

## 2023-05-01 DIAGNOSIS — B009 Herpesviral infection, unspecified: Secondary | ICD-10-CM

## 2023-05-01 DIAGNOSIS — Z8759 Personal history of other complications of pregnancy, childbirth and the puerperium: Secondary | ICD-10-CM

## 2023-05-01 MED ORDER — NIFEDIPINE ER OSMOTIC RELEASE 30 MG PO TB24: 30.0000 mg | ORAL_TABLET | Freq: Every day | ORAL | 2 refills | Status: AC

## 2023-05-01 NOTE — Progress Notes (Signed)
   PRENATAL VISIT NOTE  Subjective:  Julia Potter is a 33 y.o. [phone] at [redacted]w[redacted]d being seen today for ongoing prenatal care.  She is currently monitored for the following issues for this high-risk pregnancy and has Psychiatric illness; Hirsutism; Chronic hypertension affecting pregnancy; Obesity affecting pregnancy; Herpes; Rh negative state in antepartum period; History of preterm delivery due to severe preeclampsia; History of delayed postpartum hemorrhage; History of severe pre-eclampsia; Supervision of high risk pregnancy, antepartum; Concern for right renal agnesis, pending confirmation @32wk  [place]; and Mild preeclampsia, third trimester on their problem list.  Patient reports  lots of vaginal pressure .  Contractions: Not present.  .  Movement: Present. Denies leaking of fluid.   The following portions of the patient's history were reviewed and updated as appropriate: allergies, current medications, past family history, past medical history, past social history, past surgical history and problem list.   Objective:   Vitals:   05/01/23 1355  BP: 124/69  Pulse: (!) 106  Weight: 238 lb 9.6 oz (108.2 kg)   Fetal Status: Fetal Heart Rate (bpm): 145   Movement: Present     General:  Alert, oriented and cooperative. Patient is in no acute distress.  Skin: Skin is warm and dry. No rash noted.   Cardiovascular: Normal heart rate noted  Respiratory: Normal respiratory effort, no problems with respiration noted  Abdomen: Soft, gravid, appropriate for gestational age.  Pain/Pressure: Absent     Pelvic: Cervical exam deferred        Extremities: Normal range of motion.     Mental Status: Normal mood and affect. Normal behavior. Normal judgment and thought content.   Assessment and Plan:  Pregnancy: [id number] at [redacted]w[redacted]d  1. Chronic hypertension affecting pregnancy (Primary) Reordered procardia 30 mg XL today  2. Mild preeclampsia, third trimester Induction scheduled today, orders placed  3.  Concern for right renal agnesis, pending confirmation @32wk  [place] Will need f/u in hospital  4. Rh negative state in antepartum period F/u in hospital  5. History of preterm delivery due to severe preeclampsia  6. History of delayed postpartum hemorrhage  7. Herpes No s/s lesions Cont valtrex  8. Obesity affecting pregnancy, antepartum, unspecified obesity type Cont baby aspirin  9. [redacted] weeks gestation of pregnancy   Preterm labor symptoms and general obstetric precautions including but not limited to vaginal bleeding, contractions, leaking of fluid and fetal movement were reviewed in detail with the patient. Please refer to After Visit Summary for other counseling recommendations.   Return in about 1 week (around 05/08/2023) for high OB.  Future Appointments  Date Time Provider Department Center  05/08/2023  8:00 AM [facility] PROVIDER 1 [facility]  05/08/2023  8:30 AM [facility] US2 [facility]  05/08/2023 10:15 AM [place] Bing, MD [facility]  05/15/2023  8:00 AM [facility] PROVIDER 1 [facility]  05/15/2023  8:30 AM [facility] US2 [facility]  05/16/2023  7:00 AM MC-LD SCHED ROOM [facility] None  05/22/2023 11:00 AM Alvstad, Kristin L, RPH-CPP [facility] None  07/11/2023 11:00 AM Tobb, Lavona Mound, DO [facility] None    Conan Bowens, MD

## 2023-05-02 ENCOUNTER — Encounter (HOSPITAL_COMMUNITY): Payer: Self-pay

## 2023-05-02 ENCOUNTER — Telehealth (HOSPITAL_COMMUNITY): Payer: Self-pay | Admitting: *Deleted

## 2023-05-02 NOTE — Telephone Encounter (Signed)
 Preadmission screen

## 2023-05-03 ENCOUNTER — Encounter (HOSPITAL_COMMUNITY): Payer: Self-pay | Admitting: *Deleted

## 2023-05-03 ENCOUNTER — Telehealth: Payer: Self-pay

## 2023-05-03 ENCOUNTER — Telehealth (HOSPITAL_COMMUNITY): Payer: Self-pay | Admitting: *Deleted

## 2023-05-03 NOTE — Telephone Encounter (Signed)
 Preadmission screen

## 2023-05-03 NOTE — Telephone Encounter (Signed)
 Called patient to make introduction for [employer].

## 2023-05-05 ENCOUNTER — Encounter: Admitting: Obstetrics & Gynecology

## 2023-05-08 ENCOUNTER — Ambulatory Visit: Payer: Medicaid Other

## 2023-05-08 ENCOUNTER — Other Ambulatory Visit (HOSPITAL_COMMUNITY)
Admission: RE | Admit: 2023-05-08 | Discharge: 2023-05-08 | Disposition: A | Discharge status: Home / Self Care | Source: Ambulatory Visit | Attending: Obstetrics and Gynecology | Admitting: Obstetrics and Gynecology

## 2023-05-08 ENCOUNTER — Ambulatory Visit: Payer: Medicaid Other | Attending: Obstetrics and Gynecology

## 2023-05-08 ENCOUNTER — Other Ambulatory Visit: Payer: Self-pay

## 2023-05-08 ENCOUNTER — Ambulatory Visit (HOSPITAL_BASED_OUTPATIENT_CLINIC_OR_DEPARTMENT_OTHER): Admitting: Maternal & Fetal Medicine

## 2023-05-08 ENCOUNTER — Ambulatory Visit: Admitting: Obstetrics and Gynecology

## 2023-05-08 VITALS — BP 130/81 | HR 110 | Wt 242.3 lb

## 2023-05-08 VITALS — BP 130/81 | HR 109

## 2023-05-08 DIAGNOSIS — O99213 Obesity complicating pregnancy, third trimester: Secondary | ICD-10-CM | POA: Insufficient documentation

## 2023-05-08 DIAGNOSIS — O35EXX0 Maternal care for other (suspected) fetal abnormality and damage, fetal genitourinary anomalies, not applicable or unspecified: Secondary | ICD-10-CM

## 2023-05-08 DIAGNOSIS — O0990 Supervision of high risk pregnancy, unspecified, unspecified trimester: Secondary | ICD-10-CM | POA: Insufficient documentation

## 2023-05-08 DIAGNOSIS — Z3A35 35 weeks gestation of pregnancy: Secondary | ICD-10-CM | POA: Diagnosis not present

## 2023-05-08 DIAGNOSIS — O1403 Mild to moderate pre-eclampsia, third trimester: Secondary | ICD-10-CM | POA: Insufficient documentation

## 2023-05-08 DIAGNOSIS — O10013 Pre-existing essential hypertension complicating pregnancy, third trimester: Secondary | ICD-10-CM | POA: Diagnosis not present

## 2023-05-08 DIAGNOSIS — Z3A36 36 weeks gestation of pregnancy: Secondary | ICD-10-CM

## 2023-05-08 DIAGNOSIS — O09299 Supervision of pregnancy with other poor reproductive or obstetric history, unspecified trimester: Secondary | ICD-10-CM | POA: Insufficient documentation

## 2023-05-08 DIAGNOSIS — Z6841 Body Mass Index (BMI) 40.0 and over, adult: Secondary | ICD-10-CM

## 2023-05-08 DIAGNOSIS — O10913 Unspecified pre-existing hypertension complicating pregnancy, third trimester: Secondary | ICD-10-CM

## 2023-05-08 DIAGNOSIS — Z6791 Unspecified blood type, Rh negative: Secondary | ICD-10-CM

## 2023-05-08 DIAGNOSIS — B009 Herpesviral infection, unspecified: Secondary | ICD-10-CM | POA: Diagnosis not present

## 2023-05-08 DIAGNOSIS — O09293 Supervision of pregnancy with other poor reproductive or obstetric history, third trimester: Secondary | ICD-10-CM

## 2023-05-08 DIAGNOSIS — E669 Obesity, unspecified: Secondary | ICD-10-CM

## 2023-05-08 DIAGNOSIS — O10919 Unspecified pre-existing hypertension complicating pregnancy, unspecified trimester: Secondary | ICD-10-CM

## 2023-05-08 DIAGNOSIS — O26893 Other specified pregnancy related conditions, third trimester: Secondary | ICD-10-CM

## 2023-05-08 NOTE — Progress Notes (Signed)
 Patient information  Patient Name: Julia Potter  Patient MRN:   [id number]  Referring practice: MFM Referring Provider: [facility])  MFM CONSULT  Julia Potter is a 33 y.o. [phone] at [redacted]w[redacted]d here for ultrasound and consultation. Patient Active Problem List   Diagnosis Date Noted   Mild preeclampsia, third trimester 04/06/2023   Concern for right renal agnesis, pending confirmation @32wk  [place] 04/03/2023   Supervision of high risk pregnancy, antepartum 11/27/2022   History of severe pre-eclampsia 10/12/2019   History of delayed postpartum hemorrhage 10/10/2019   History of preterm delivery due to severe preeclampsia 04/25/2019   Chronic hypertension affecting pregnancy 04/18/2019   Obesity affecting pregnancy 04/18/2019   Rh negative state in antepartum period 04/18/2019   Herpes    Hirsutism 09/14/2015   Psychiatric illness 11/01/2013   Julia Potter is doing well today with no acute concerns.  RE preeclampsia w/o severe features : Today her BP is normal and she is w/o symptoms. She knows the s/s to monitor for preeclampsia. Delivery at 37 weeks.   RE right fetal pelvic kidney: suspected on prenatal [place] and needs to be confirmed postnatally. Discussed the clinical implications of increased risk reflux, infection and declining function over time.    Sonographic findings Single intrauterine pregnancy. Fetal cardiac activity: Observed. Presentation: Cephalic. Interval fetal anatomy appears normal. Amniotic fluid: Within normal limits.  MVP: 3.12 cm. Placenta: Posterior. BPP: 8/8.   There are limitations of prenatal ultrasound such as the inability to detect certain abnormalities due to poor visualization. Various factors such as fetal position, gestational age and maternal body habitus may increase the difficulty in visualizing the fetal anatomy.    Recommendations - F/u om 1 week for growth [place] and BPP - Weekly CBC/CMP - IOL on 4/15 -  Notify pediatric team regarding the need for postnatal imaging to confirm the right pelvic kidney  Review of Systems: A review of systems was performed and was negative except per HPI   Vitals and Physical Exam    05/08/2023    7:59 AM 05/01/2023    1:55 PM 05/01/2023    8:07 AM  Vitals with BMI  Weight  238 lbs 10 oz   BMI  43.63   Systolic 130 124 644  Diastolic 81 69 84  Pulse 109 106 101    Sitting comfortably on the sonogram table Nonlabored breathing Normal rate and rhythm Abdomen is nontender  Past pregnancies OB History  Gravida Para Term Preterm AB Living  6 2 0 2 3 2   SAB IAB Ectopic Multiple Live Births  3 0 0 0 2    # Outcome Date GA Lbr Len/2nd Weight Sex Type Anes PTL Lv  6 Current           5 Preterm 10/10/19 [redacted]w[redacted]d / 00:05 5 lb 9.4 oz (2.534 kg) F Vag-Vacuum EPI  LIV  4 SAB 12/2017 [redacted]w[redacted]d         3 SAB 05/2017 [redacted]w[redacted]d    SAB        Complications: Missed ab, Triplets, all stillborn, S/P D&C (status post dilation and curettage)  2 SAB 2017             Complications: Missed abortion  1 Preterm 2011 [redacted]w[redacted]d  3 lb 12 oz (1.701 kg) F Vag-Spont None  LIV     Birth Comments: preterm labor , IOL for  preeclampsia     Obstetric Comments  G1P0101 Admitted to  [facility] for PIH, IOL. Delivered a viable female at 34 weeks     I spent 10 minutes reviewing the patients chart, including labs and images as well as counseling the patient about her medical conditions. Greater than 50% of the time was spent in direct face-to-face patient counseling.  Braxton Feathers  MFM, [facility]   05/08/2023  8:48 AM

## 2023-05-08 NOTE — Progress Notes (Signed)
   PRENATAL VISIT NOTE  Subjective:  Julia Potter is a 33 y.o. [phone] at [redacted]w[redacted]d being seen today for ongoing prenatal care.  She is currently monitored for the following issues for this high-risk pregnancy and has Psychiatric illness; Hirsutism; Chronic hypertension affecting pregnancy; Obesity affecting pregnancy; Herpes; Rh negative state in antepartum period; History of preterm delivery due to severe preeclampsia; History of delayed postpartum hemorrhage; History of severe pre-eclampsia; Supervision of high risk pregnancy, antepartum; Concern for right renal agnesis, pending confirmation @32wk  [place]; Mild pre-eclampsia in third trimester; and BMI 40.0-44.9, adult (HCC) on their problem list.  Patient reports no complaints.  Contractions: Irritability. Vag. Bleeding: None.  Movement: Present. Denies leaking of fluid.   The following portions of the patient's history were reviewed and updated as appropriate: allergies, current medications, past family history, past medical history, past social history, past surgical history and problem list.   Objective:   Vitals:   05/08/23 1005  BP: 130/81  Pulse: (!) 110  Weight: 242 lb 4.8 oz (109.9 kg)    Fetal Status: Fetal Heart Rate (bpm): 140   Movement: Present  Presentation: Vertex  General:  Alert, oriented and cooperative. Patient is in no acute distress.  Skin: Skin is warm and dry. No rash noted.   Cardiovascular: Normal heart rate noted  Respiratory: Normal respiratory effort, no problems with respiration noted  Abdomen: Soft, gravid, appropriate for gestational age.  Pain/Pressure: Present     Pelvic: Cervical exam deferred Dilation: Fingertip Effacement (%): 20 Station: Ballotable  Extremities: Normal range of motion.  Edema: Trace (ankles and hands)  Mental Status: Normal mood and affect. Normal behavior. Normal judgment and thought content.   Assessment and Plan:  Pregnancy: [id number] at [redacted]w[redacted]d 1. [redacted] weeks gestation of pregnancy  (Primary) Follow up re: birth control next visit - GC/Chlamydia probe amp ([place])not at [facility] - Culture, beta strep (group b only) - CBC - Comp Met (CMET)  2. Mild pre-eclampsia in third trimester Surveillance labs today and bpp reassuring today, precautions given Patient already set up for 4/15 AM IOL Rpt testing next week 4/7: ceph, 8/8, afi 6.1 3/17: efw 26%, 1955g, ac 57%, afi 13, ceph  - GC/Chlamydia probe amp ([place])not at [facility] - Culture, beta strep (group b only) - CBC - Comp Met (CMET)  3. Chronic hypertension affecting pregnancy No issues on procardia xl 30 qday  4. Herpes Continue on valtrex ppx  5. Concern for right renal agnesis, pending confirmation @32wk  [place] baby needs post natal renal u/s for ?pelvic kidney vs absent Rt kidney   6. Rh negative state in antepartum period S/p rhogam  7. Obesity affecting pregnancy in third trimester, unspecified obesity type  8. BMI 40.0-44.9, adult (HCC)  Preterm labor symptoms and general obstetric precautions including but not limited to vaginal bleeding, contractions, leaking of fluid and fetal movement were reviewed in detail with the patient. Please refer to After Visit Summary for other counseling recommendations.   Return in 1 week (on 05/15/2023) for in person, md visit, high risk ob.  Future Appointments  Date Time Provider Department Center  05/15/2023  8:00 AM [facility] PROVIDER 1 [facility]  05/15/2023  8:30 AM [facility] US2 [facility]  05/16/2023  7:00 AM MC-LD SCHED ROOM [facility] None  05/22/2023 11:00 AM Alvstad, Kristin L, RPH-CPP [facility] None  07/11/2023 11:00 AM Tobb, Lavona Mound, DO [facility] None    [place] Bing, MD

## 2023-05-08 NOTE — Patient Instructions (Signed)
Continue to check your blood pressures once a day Call the office for blood pressures that are consistently above 150 for the top number or 100 for the bottom number   Hypertension During Pregnancy Hypertension is also called high blood pressure. High blood pressure means that the force of your blood moving in your body is too strong. It can cause problems for you and your baby. Different types of high blood pressure can happen during pregnancy. The types are: High blood pressure before you got pregnant. This is called chronic hypertension.  This can continue during your pregnancy. Your doctor will want to keep checking your blood pressure. You may need medicine to keep your blood pressure under control while you are pregnant. You will need follow-up visits after you have your baby. High blood pressure that goes up during pregnancy when it was normal before. This is called gestational hypertension. It will usually get better after you have your baby, but your doctor will need to watch your blood pressure to make sure that it is getting better. Very high blood pressure during pregnancy. This is called preeclampsia. Very high blood pressure is an emergency that needs to be checked and treated right away. You may develop very high blood pressure after giving birth. This is called postpartum preeclampsia. This usually occurs within 48 hours after childbirth but may occur up to 6 weeks after giving birth. This is rare. How does this affect me? If you have high blood pressure during pregnancy, you have a higher chance of developing high blood pressure: As you get older. If you get pregnant again. In some cases, high blood pressure during pregnancy can cause: Stroke. Heart attack. Damage to the kidneys, lungs, or liver. Preeclampsia. Jerky movements you cannot control (convulsions or seizures). Problems with the placenta.  What can I do to lower my risk?  Keep a healthy weight. Eat a healthy  diet. Follow what your doctor tells you about treating any medical problems that you had before becoming pregnant. It is very important to go to all of your doctor visits. Your doctor will check your blood pressure and make sure that your pregnancy is progressing as it should. Treatment should start early if a problem is found.  Follow these instructions at home:  Take your blood pressure 1-2 times per day. Call the office if your blood pressure is 155 or higher for the top number or 105 or higher for the bottom number.    Eating and drinking  Drink enough fluid to keep your pee (urine) pale yellow. Avoid caffeine. Lifestyle Do not use any products that contain nicotine or tobacco, such as cigarettes, e-cigarettes, and chewing tobacco. If you need help quitting, ask your doctor. Do not use alcohol or drugs. Avoid stress. Rest and get plenty of sleep. Regular exercise can help. Ask your doctor what kinds of exercise are best for you. General instructions Take over-the-counter and prescription medicines only as told by your doctor. Keep all prenatal and follow-up visits as told by your doctor. This is important. Contact a doctor if: You have symptoms that your doctor told you to watch for, such as: Headaches. Nausea. Vomiting. Belly (abdominal) pain. Dizziness. Light-headedness. Get help right away if: You have: Very bad belly pain that does not get better with treatment. A very bad headache that does not get better. Vomiting that does not get better. Sudden, fast weight gain. Sudden swelling in your hands, ankles, or face. Blood in your pee. Blurry vision. Double vision.  Shortness of breath. Chest pain. Weakness on one side of your body. Trouble talking. Summary High blood pressure is also called hypertension. High blood pressure means that the force of your blood moving in your body is too strong. High blood pressure can cause problems for you and your baby. Keep all  follow-up visits as told by your doctor. This is important. This information is not intended to replace advice given to you by your health care provider. Make sure you discuss any questions you have with your health care provider. Document Released: 02/19/2010 Document Revised: 05/10/2018 Document Reviewed: 02/13/2018 Elsevier Patient Education  2020 [employer].

## 2023-05-09 LAB — GC/CHLAMYDIA PROBE AMP (~~LOC~~) NOT AT ARMC
Chlamydia: NEGATIVE
Comment: NEGATIVE
Comment: NORMAL
Neisseria Gonorrhea: NEGATIVE

## 2023-05-10 LAB — COMPREHENSIVE METABOLIC PANEL WITH GFR
ALT: 10 IU/L (ref 0–32)
AST: 14 IU/L (ref 0–40)
Albumin: 3.3 g/dL — ABNORMAL LOW (ref 4.0–5.0)
Alkaline Phosphatase: 169 IU/L — ABNORMAL HIGH (ref 44–121)
BUN/Creatinine Ratio: 9 (ref 9–23)
BUN: 3 mg/dL — ABNORMAL LOW (ref 6–20)
Bilirubin Total: 0.3 mg/dL (ref 0.0–1.2)
CO2: 13 mmol/L — ABNORMAL LOW (ref 20–29)
Calcium: 8.7 mg/dL (ref 8.7–10.2)
Chloride: 104 mmol/L (ref 96–106)
Creatinine, Ser: 0.32 mg/dL — ABNORMAL LOW (ref 0.57–1.00)
Globulin, Total: 3.1 g/dL (ref 1.5–4.5)
Glucose: 110 mg/dL — ABNORMAL HIGH (ref 70–99)
Potassium: 3.9 mmol/L (ref 3.5–5.2)
Sodium: 138 mmol/L (ref 134–144)
Total Protein: 6.4 g/dL (ref 6.0–8.5)
eGFR: 144 mL/min/{1.73_m2} (ref >59)

## 2023-05-10 LAB — CBC
Hematocrit: 37.4 % (ref 34.0–46.6)
Hemoglobin: 12.8 g/dL (ref 11.1–15.9)
MCH: 31.7 pg (ref 26.6–33.0)
MCHC: 34.2 g/dL (ref 31.5–35.7)
MCV: 93 fL (ref 79–97)
Platelets: 357 10*3/uL (ref 150–450)
RBC: 4.04 x10E6/uL (ref 3.77–5.28)
RDW: 12.9 % (ref 11.7–15.4)
WBC: 8.8 10*3/uL (ref 3.4–10.8)

## 2023-05-11 LAB — CULTURE, BETA STREP (GROUP B ONLY): Strep Gp B Culture: NEGATIVE

## 2023-05-12 ENCOUNTER — Other Ambulatory Visit: Payer: Self-pay | Admitting: Advanced Practice Midwife

## 2023-05-15 ENCOUNTER — Ambulatory Visit: Admitting: Obstetrics and Gynecology

## 2023-05-15 ENCOUNTER — Ambulatory Visit: Payer: Medicaid Other

## 2023-05-15 ENCOUNTER — Ambulatory Visit (HOSPITAL_BASED_OUTPATIENT_CLINIC_OR_DEPARTMENT_OTHER): Admitting: Maternal & Fetal Medicine

## 2023-05-15 VITALS — BP 120/81 | HR 112 | Wt 244.0 lb

## 2023-05-15 DIAGNOSIS — O1493 Unspecified pre-eclampsia, third trimester: Secondary | ICD-10-CM

## 2023-05-15 DIAGNOSIS — O10913 Unspecified pre-existing hypertension complicating pregnancy, third trimester: Secondary | ICD-10-CM | POA: Insufficient documentation

## 2023-05-15 DIAGNOSIS — Z3A36 36 weeks gestation of pregnancy: Secondary | ICD-10-CM

## 2023-05-15 DIAGNOSIS — O10919 Unspecified pre-existing hypertension complicating pregnancy, unspecified trimester: Secondary | ICD-10-CM | POA: Insufficient documentation

## 2023-05-15 DIAGNOSIS — O1403 Mild to moderate pre-eclampsia, third trimester: Secondary | ICD-10-CM

## 2023-05-15 DIAGNOSIS — Z6841 Body Mass Index (BMI) 40.0 and over, adult: Secondary | ICD-10-CM | POA: Insufficient documentation

## 2023-05-15 DIAGNOSIS — O99213 Obesity complicating pregnancy, third trimester: Secondary | ICD-10-CM | POA: Insufficient documentation

## 2023-05-15 DIAGNOSIS — E669 Obesity, unspecified: Secondary | ICD-10-CM | POA: Diagnosis not present

## 2023-05-15 DIAGNOSIS — O35EXX0 Maternal care for other (suspected) fetal abnormality and damage, fetal genitourinary anomalies, not applicable or unspecified: Secondary | ICD-10-CM | POA: Insufficient documentation

## 2023-05-15 DIAGNOSIS — Z3A37 37 weeks gestation of pregnancy: Secondary | ICD-10-CM | POA: Diagnosis not present

## 2023-05-15 DIAGNOSIS — O09299 Supervision of pregnancy with other poor reproductive or obstetric history, unspecified trimester: Secondary | ICD-10-CM | POA: Insufficient documentation

## 2023-05-15 DIAGNOSIS — O09293 Supervision of pregnancy with other poor reproductive or obstetric history, third trimester: Secondary | ICD-10-CM | POA: Diagnosis not present

## 2023-05-15 NOTE — Progress Notes (Signed)
   PRENATAL VISIT NOTE  Subjective:  Julia Potter is a 33 y.o. [phone] at [redacted]w[redacted]d being seen today for ongoing prenatal care.  She is currently monitored for the following issues for this high-risk pregnancy and has Psychiatric illness; Hirsutism; Chronic hypertension affecting pregnancy; Obesity affecting pregnancy; Herpes; Rh negative state in antepartum period; History of preterm delivery due to severe preeclampsia; History of delayed postpartum hemorrhage; History of severe pre-eclampsia; Supervision of high risk pregnancy, antepartum; Concern for right renal agnesis, pending confirmation @32wk  US ; Mild pre-eclampsia in third trimester; and BMI 40.0-44.9, adult (HCC) on their problem list.  Patient reports no complaints.  Contractions: Irritability. Vag. Bleeding: None.  Movement: Present. Denies leaking of fluid.   The following portions of the patient's history were reviewed and updated as appropriate: allergies, current medications, past family history, past medical history, past social history, past surgical history and problem list.   Objective:   Vitals:   05/15/23 1043  BP: 120/81  Pulse: (!) 112  Weight: 244 lb (110.7 kg)    Fetal Status: Fetal Heart Rate (bpm): 142   Movement: Present     General:  Alert, oriented and cooperative. Patient is in no acute distress.  Skin: Skin is warm and dry. No rash noted.   Cardiovascular: Normal heart rate noted  Respiratory: Normal respiratory effort, no problems with respiration noted  Abdomen: Soft, gravid, appropriate for gestational age.  Pain/Pressure: Present     Pelvic: Cervical exam deferred        Extremities: Normal range of motion.  Edema: None  Mental Status: Normal mood and affect. Normal behavior. Normal judgment and thought content.   Assessment and Plan:  Pregnancy: [id number] at [redacted]w[redacted]d 1. Mild pre-eclampsia in third trimester (Primary) No issues. 31%, 2865g, ac 42%, afi 9.7, bpp 8/8, cephalic today. For IOL tomorrow.   - Comp Met (CMET) - CBC  2. [redacted] weeks gestation of pregnancy GBS neg. IUD, likely immediately PP. D/w her re: hormone vs copper option and interval placement - Comp Met (CMET) - CBC   3. Chronic hypertension affecting pregnancy No issues on procardia xl 30 qday   4. Herpes Continue on valtrex ppx   5. Concern for right renal agnesis, pending confirmation @32wk  US  baby needs post natal renal u/s for ?pelvic kidney vs absent Rt kidney    6. Rh negative state in antepartum period S/p rhogam. Repeat PP PRN   7. Obesity affecting pregnancy in third trimester, unspecified obesity type   8. BMI 40.0-44.9, adult (HCC)  Preterm labor symptoms and general obstetric precautions including but not limited to vaginal bleeding, contractions, leaking of fluid and fetal movement were reviewed in detail with the patient. Please refer to After Visit Summary for other counseling recommendations.   Return in about 10 days (around 05/25/2023) for bp check, rn visit, in person.  Future Appointments  Date Time Provider Department Center  05/16/2023  7:00 AM [facility] SCHED ROOM [facility] None  05/22/2023 11:00 AM Alvstad, Abigail Abler, RPH-CPP [facility] None  07/11/2023 11:00 AM Tobb, Kardie, DO [facility] None    Raynell Caller, MD

## 2023-05-15 NOTE — Progress Notes (Signed)
 Patient information  Patient Name: Julia Potter  Patient MRN:   [id number]  Referring practice: MFM Referring Provider: [facility])  MFM CONSULT  Julia Potter is a 33 y.o. [phone] at [redacted]w[redacted]d here for ultrasound and consultation. Patient Active Problem List   Diagnosis Date Noted   BMI 40.0-44.9, adult (HCC) 05/08/2023   Mild pre-eclampsia in third trimester 04/06/2023   Concern for right renal agnesis, pending confirmation @32wk  [place] 04/03/2023   Supervision of high risk pregnancy, antepartum 11/27/2022   History of severe pre-eclampsia 10/12/2019   History of delayed postpartum hemorrhage 10/10/2019   History of preterm delivery due to severe preeclampsia 04/25/2019   Chronic hypertension affecting pregnancy 04/18/2019   Obesity affecting pregnancy 04/18/2019   Rh negative state in antepartum period 04/18/2019   Herpes    Hirsutism 09/14/2015   Psychiatric illness 11/01/2013    Julia Potter is doing well today with no acute concerns.  The patient is here due to chronic hypertension and elevated body mass index.  Today her blood pressure is normal and she denies headaches or vision changes.  We discussed fetal movement precautions as well as monitoring for signs and symptoms of preeclampsia.  She has her induction scheduled for tomorrow and has no other questions.  She has a prenatal visit following today's ultrasound and I encouraged her to have her membranes swept to encourage spontaneous labor.  Sonographic findings Single intrauterine pregnancy. Fetal cardiac activity: Observed. Presentation: Cephalic. Interval fetal anatomy appears normal. Fetal biometry shows the estimated fetal weight at the 31 percentile Amniotic fluid: Within normal limits.  MVP: 3.55 cm. Placenta: Posterior. BPP: 8/8.   There are limitations of prenatal ultrasound such as the inability to detect certain abnormalities due to poor visualization. Various factors  such as fetal position, gestational age and maternal body habitus may increase the difficulty in visualizing the fetal anatomy.    Recommendations -IOL scheduled for tomorrow  -Membrane sweep to be considered at today's prenatal visit -OB precautions given  Review of Systems: A review of systems was performed and was negative except per HPI   Vitals and Physical Exam    05/15/2023    8:33 AM 05/08/2023   10:05 AM 05/08/2023    7:59 AM  Vitals with BMI  Weight  242 lbs 5 oz   BMI  44.31   Systolic 133 130 147  Diastolic 79 81 81  Pulse 108 110 109    Sitting comfortably on the sonogram table Nonlabored breathing Normal rate and rhythm Abdomen is nontender  Past pregnancies OB History  Gravida Para Term Preterm AB Living  6 2 0 2 3 2   SAB IAB Ectopic Multiple Live Births  3 0 0 0 2    # Outcome Date GA Lbr Len/2nd Weight Sex Type Anes PTL Lv  6 Current           5 Preterm 10/10/19 [redacted]w[redacted]d / 00:05 5 lb 9.4 oz (2.534 kg) F Vag-Vacuum EPI  LIV  4 SAB 12/2017 [redacted]w[redacted]d         3 SAB 05/2017 [redacted]w[redacted]d    SAB        Complications: Missed ab, Triplets, all stillborn, S/P D&C (status post dilation and curettage)  2 SAB 2017             Complications: Missed abortion  1 Preterm 2011 [redacted]w[redacted]d  3 lb 12 oz (1.701 kg) F Vag-Spont None  LIV  Birth Comments: preterm labor , IOL for  preeclampsia     Obstetric Comments  G1P0101 Admitted to [facility] for PIH, IOL. Delivered a viable female at 34 weeks     I spent 10 minutes reviewing the patients chart, including labs and images as well as counseling the patient about her medical conditions. Greater than 50% of the time was spent in direct face-to-face patient counseling.  Penney Bowling  MFM, [facility]   05/15/2023  8:59 AM

## 2023-05-16 ENCOUNTER — Encounter (HOSPITAL_COMMUNITY): Payer: Self-pay | Admitting: Obstetrics and Gynecology

## 2023-05-16 ENCOUNTER — Inpatient Hospital Stay (HOSPITAL_COMMUNITY)
Admission: RE | Admit: 2023-05-16 | Discharge: 2023-05-18 | DRG: 768 | Disposition: A | Discharge status: Home / Self Care | Attending: Obstetrics and Gynecology | Admitting: Obstetrics and Gynecology

## 2023-05-16 ENCOUNTER — Inpatient Hospital Stay (HOSPITAL_COMMUNITY): Admitting: Anesthesiology

## 2023-05-16 ENCOUNTER — Inpatient Hospital Stay (HOSPITAL_COMMUNITY)

## 2023-05-16 DIAGNOSIS — Z3A37 37 weeks gestation of pregnancy: Secondary | ICD-10-CM

## 2023-05-16 DIAGNOSIS — O1403 Mild to moderate pre-eclampsia, third trimester: Secondary | ICD-10-CM

## 2023-05-16 DIAGNOSIS — O1092 Unspecified pre-existing hypertension complicating childbirth: Secondary | ICD-10-CM | POA: Diagnosis not present

## 2023-05-16 DIAGNOSIS — E66813 Obesity, class 3: Secondary | ICD-10-CM | POA: Diagnosis present

## 2023-05-16 DIAGNOSIS — O1414 Severe pre-eclampsia complicating childbirth: Secondary | ICD-10-CM | POA: Diagnosis not present

## 2023-05-16 DIAGNOSIS — O99210 Obesity complicating pregnancy, unspecified trimester: Secondary | ICD-10-CM | POA: Diagnosis present

## 2023-05-16 DIAGNOSIS — A6000 Herpesviral infection of urogenital system, unspecified: Secondary | ICD-10-CM | POA: Diagnosis not present

## 2023-05-16 DIAGNOSIS — Z7982 Long term (current) use of aspirin: Secondary | ICD-10-CM | POA: Diagnosis not present

## 2023-05-16 DIAGNOSIS — O26899 Other specified pregnancy related conditions, unspecified trimester: Secondary | ICD-10-CM

## 2023-05-16 DIAGNOSIS — O1490 Unspecified pre-eclampsia, unspecified trimester: Principal | ICD-10-CM | POA: Diagnosis present

## 2023-05-16 DIAGNOSIS — Z833 Family history of diabetes mellitus: Secondary | ICD-10-CM

## 2023-05-16 DIAGNOSIS — O358XX0 Maternal care for other (suspected) fetal abnormality and damage, not applicable or unspecified: Secondary | ICD-10-CM | POA: Diagnosis present

## 2023-05-16 DIAGNOSIS — O26893 Other specified pregnancy related conditions, third trimester: Secondary | ICD-10-CM | POA: Diagnosis not present

## 2023-05-16 DIAGNOSIS — O114 Pre-existing hypertension with pre-eclampsia, complicating childbirth: Secondary | ICD-10-CM | POA: Diagnosis not present

## 2023-05-16 DIAGNOSIS — D62 Acute posthemorrhagic anemia: Secondary | ICD-10-CM | POA: Diagnosis not present

## 2023-05-16 DIAGNOSIS — O99214 Obesity complicating childbirth: Secondary | ICD-10-CM | POA: Diagnosis not present

## 2023-05-16 DIAGNOSIS — O9081 Anemia of the puerperium: Secondary | ICD-10-CM | POA: Diagnosis not present

## 2023-05-16 DIAGNOSIS — B009 Herpesviral infection, unspecified: Secondary | ICD-10-CM | POA: Diagnosis present

## 2023-05-16 DIAGNOSIS — O9832 Other infections with a predominantly sexual mode of transmission complicating childbirth: Secondary | ICD-10-CM | POA: Diagnosis present

## 2023-05-16 DIAGNOSIS — Z8249 Family history of ischemic heart disease and other diseases of the circulatory system: Secondary | ICD-10-CM

## 2023-05-16 DIAGNOSIS — Z6791 Unspecified blood type, Rh negative: Secondary | ICD-10-CM | POA: Diagnosis not present

## 2023-05-16 DIAGNOSIS — O10919 Unspecified pre-existing hypertension complicating pregnancy, unspecified trimester: Secondary | ICD-10-CM | POA: Diagnosis present

## 2023-05-16 LAB — CBC
HCT: 35.9 % — ABNORMAL LOW (ref 36.0–46.0)
Hematocrit: 38 % (ref 34.0–46.6)
Hemoglobin: 12.4 g/dL (ref 11.1–15.9)
Hemoglobin: 12.1 g/dL (ref 12.0–15.0)
MCH: 30.2 pg (ref 26.6–33.0)
MCH: 30.6 pg (ref 26.0–34.0)
MCHC: 32.6 g/dL (ref 31.5–35.7)
MCHC: 33.7 g/dL (ref 30.0–36.0)
MCV: 93 fL (ref 79–97)
MCV: 90.7 fL (ref 80.0–100.0)
NRBC: 1 % — ABNORMAL HIGH (ref 0–0)
Platelets: 336 10*3/uL (ref 150–450)
Platelets: 341 10*3/uL (ref 150–400)
RBC: 4.1 x10E6/uL (ref 3.77–5.28)
RBC: 3.96 MIL/uL (ref 3.87–5.11)
RDW: 12.8 % (ref 11.7–15.4)
RDW: 13.2 % (ref 11.5–15.5)
WBC: 8.8 10*3/uL (ref 3.4–10.8)
WBC: 8.4 10*3/uL (ref 4.0–10.5)
nRBC: 0.4 % — ABNORMAL HIGH (ref 0.0–0.2)

## 2023-05-16 LAB — TYPE AND SCREEN
ABO/RH(D): A NEG
Antibody Screen: NEGATIVE

## 2023-05-16 LAB — COMPREHENSIVE METABOLIC PANEL WITH GFR
ALT: 10 IU/L (ref 0–32)
AST: 13 IU/L (ref 0–40)
Albumin: 3.5 g/dL — ABNORMAL LOW (ref 4.0–5.0)
Alkaline Phosphatase: 180 IU/L — ABNORMAL HIGH (ref 44–121)
BUN/Creatinine Ratio: 12 (ref 9–23)
BUN: 6 mg/dL (ref 6–20)
Bilirubin Total: 0.2 mg/dL (ref 0.0–1.2)
CO2: 17 mmol/L — ABNORMAL LOW (ref 20–29)
Calcium: 9.1 mg/dL (ref 8.7–10.2)
Chloride: 102 mmol/L (ref 96–106)
Creatinine, Ser: 0.5 mg/dL — ABNORMAL LOW (ref 0.57–1.00)
Globulin, Total: 3.2 g/dL (ref 1.5–4.5)
Glucose: 63 mg/dL — ABNORMAL LOW (ref 70–99)
Potassium: 4.3 mmol/L (ref 3.5–5.2)
Sodium: 134 mmol/L (ref 134–144)
Total Protein: 6.7 g/dL (ref 6.0–8.5)
eGFR: 129 mL/min/{1.73_m2} (ref >59)

## 2023-05-16 LAB — RPR: RPR Ser Ql: NONREACTIVE

## 2023-05-16 MED ORDER — EPHEDRINE 5 MG/ML INJ: 10.0000 mg | INTRAVENOUS | Status: DC | PRN

## 2023-05-16 MED ORDER — DIPHENHYDRAMINE HCL 50 MG/ML IJ SOLN: 12.5000 mg | INTRAMUSCULAR | Status: DC | PRN

## 2023-05-16 MED ORDER — FENTANYL CITRATE (PF) 100 MCG/2ML IJ SOLN
INTRAMUSCULAR | Status: AC
  Filled 2023-05-16: qty 2

## 2023-05-16 MED ORDER — LIDOCAINE HCL (PF) 1 % IJ SOLN: 30.0000 mL | INTRAMUSCULAR | Status: DC | PRN

## 2023-05-16 MED ORDER — LACTATED RINGERS IV SOLN: INTRAVENOUS | Status: DC

## 2023-05-16 MED ORDER — ACETAMINOPHEN 325 MG PO TABS: 650.0000 mg | ORAL_TABLET | ORAL | Status: DC | PRN

## 2023-05-16 MED ORDER — LIDOCAINE HCL (PF) 1 % IJ SOLN
INTRAMUSCULAR | Status: DC | PRN
  Administered 2023-05-16: 11 mL via EPIDURAL

## 2023-05-16 MED ORDER — PHENYLEPHRINE 80 MCG/ML (10ML) SYRINGE FOR IV PUSH (FOR BLOOD PRESSURE SUPPORT): 80.0000 ug | PREFILLED_SYRINGE | INTRAVENOUS | Status: DC | PRN

## 2023-05-16 MED ORDER — CHLOROPROCAINE HCL (PF) 3 % IJ SOLN
INTRAMUSCULAR | Status: DC | PRN
  Administered 2023-05-16 (×2): 10 mL via EPIDURAL

## 2023-05-16 MED ORDER — TERBUTALINE SULFATE 1 MG/ML IJ SOLN: 0.2500 mg | Freq: Once | INTRAMUSCULAR | Status: DC | PRN

## 2023-05-16 MED ORDER — FENTANYL CITRATE (PF) 100 MCG/2ML IJ SOLN
INTRAMUSCULAR | Status: DC | PRN
  Administered 2023-05-16 (×2): 100 ug via INTRAVENOUS

## 2023-05-16 MED ORDER — OXYTOCIN-SODIUM CHLORIDE 30-0.9 UT/500ML-% IV SOLN
2.5000 [IU]/h | INTRAVENOUS | Status: DC
  Administered 2023-05-16: 2.5 [IU]/h via INTRAVENOUS
  Filled 2023-05-16: qty 500

## 2023-05-16 MED ORDER — LACTATED RINGERS IV SOLN
500.0000 mL | Freq: Once | INTRAVENOUS | Status: AC
  Administered 2023-05-16: 500 mL via INTRAVENOUS

## 2023-05-16 MED ORDER — NIFEDIPINE ER OSMOTIC RELEASE 30 MG PO TB24
30.0000 mg | ORAL_TABLET | Freq: Every day | ORAL | Status: DC
  Administered 2023-05-16 – 2023-05-18 (×3): 30 mg via ORAL
  Filled 2023-05-16 (×3): qty 1

## 2023-05-16 MED ORDER — PARAGARD INTRAUTERINE COPPER IU IUD
1.0000 | INTRAUTERINE_SYSTEM | Freq: Once | INTRAUTERINE | Status: DC
  Filled 2023-05-16: qty 1

## 2023-05-16 MED ORDER — TRANEXAMIC ACID-NACL 1000-0.7 MG/100ML-% IV SOLN: 1000.0000 mg | Freq: Once | INTRAVENOUS | Status: AC

## 2023-05-16 MED ORDER — LACTATED RINGERS IV SOLN: 500.0000 mL | INTRAVENOUS | Status: DC | PRN

## 2023-05-16 MED ORDER — MISOPROSTOL 50MCG HALF TABLET: 50.0000 ug | ORAL_TABLET | Freq: Once | ORAL | Status: DC

## 2023-05-16 MED ORDER — ONDANSETRON HCL 4 MG/2ML IJ SOLN: 4.0000 mg | Freq: Four times a day (QID) | INTRAMUSCULAR | Status: DC | PRN

## 2023-05-16 MED ORDER — OXYTOCIN BOLUS FROM INFUSION
333.0000 mL | Freq: Once | INTRAVENOUS | Status: AC
  Administered 2023-05-16: 333 mL via INTRAVENOUS

## 2023-05-16 MED ORDER — MISOPROSTOL 25 MCG QUARTER TABLET
25.0000 ug | ORAL_TABLET | ORAL | Status: DC
  Administered 2023-05-16: 25 ug via VAGINAL
  Filled 2023-05-16: qty 1

## 2023-05-16 MED ORDER — MISOPROSTOL 200 MCG PO TABS
ORAL_TABLET | ORAL | Status: AC
Start: 2023-05-16 — End: 2023-05-17
  Filled 2023-05-16: qty 5

## 2023-05-16 MED ORDER — BUPIVACAINE HCL (PF) 0.25 % IJ SOLN
INTRAMUSCULAR | Status: DC | PRN
  Administered 2023-05-16: 10 mL via EPIDURAL

## 2023-05-16 MED ORDER — FENTANYL CITRATE (PF) 100 MCG/2ML IJ SOLN
INTRAMUSCULAR | Status: DC | PRN
  Administered 2023-05-16: 100 ug via EPIDURAL

## 2023-05-16 MED ORDER — TRANEXAMIC ACID-NACL 1000-0.7 MG/100ML-% IV SOLN
INTRAVENOUS | Status: AC
  Administered 2023-05-16: 1000 mg
  Filled 2023-05-16: qty 100

## 2023-05-16 MED ORDER — FENTANYL-BUPIVACAINE-NACL 0.5-0.125-0.9 MG/250ML-% EP SOLN
12.0000 mL/h | EPIDURAL | Status: DC | PRN
  Administered 2023-05-16: 12 mL/h via EPIDURAL
  Filled 2023-05-16: qty 250

## 2023-05-16 MED ORDER — SOD CITRATE-CITRIC ACID 500-334 MG/5ML PO SOLN: 30.0000 mL | ORAL | Status: DC | PRN

## 2023-05-16 MED ORDER — MISOPROSTOL 200 MCG PO TABS: 1000.0000 ug | ORAL_TABLET | Freq: Once | ORAL | Status: AC

## 2023-05-16 MED ORDER — MISOPROSTOL 50MCG HALF TABLET
50.0000 ug | ORAL_TABLET | Freq: Once | ORAL | Status: AC
  Administered 2023-05-16: 50 ug via ORAL
  Filled 2023-05-16: qty 1

## 2023-05-16 MED ORDER — IBUPROFEN 800 MG PO TABS
800.0000 mg | ORAL_TABLET | Freq: Three times a day (TID) | ORAL | Status: DC
  Administered 2023-05-16 – 2023-05-18 (×6): 800 mg via ORAL
  Filled 2023-05-16 (×6): qty 1

## 2023-05-16 NOTE — Anesthesia Preprocedure Evaluation (Signed)
 Anesthesia Evaluation  Patient identified by MRN, date of birth, ID band  Reviewed: Allergy & Precautions, NPO status , Patient's Chart, lab work & pertinent test results  Airway Mallampati: III  TM Distance: >3 FB Neck ROM: Full    Dental no notable dental hx.    Pulmonary neg pulmonary ROS   Pulmonary exam normal breath sounds clear to auscultation       Cardiovascular hypertension, Pt. on medications Normal cardiovascular exam Rhythm:Regular Rate:Normal     Neuro/Psych  PSYCHIATRIC DISORDERS Anxiety Depression    negative neurological ROS     GI/Hepatic negative GI ROS, Neg liver ROS,,,  Endo/Other    Class 3 obesity  Renal/GU negative Renal ROS  negative genitourinary   Musculoskeletal negative musculoskeletal ROS (+)    Abdominal  (+) + obese  Peds  Hematology  (+) Blood dyscrasia, anemia   Anesthesia Other Findings   Reproductive/Obstetrics (+) Pregnancy                              Anesthesia Physical Anesthesia Plan  ASA: III  Anesthesia Plan: Epidural   Post-op Pain Management:    Induction:   PONV Risk Score and Plan:   Airway Management Planned:   Additional Equipment:   Intra-op Plan:   Post-operative Plan:   Informed Consent: I have reviewed the patients History and Physical, chart, labs and discussed the procedure including the risks, benefits and alternatives for the proposed anesthesia with the patient or authorized representative who has indicated his/her understanding and acceptance.       Plan Discussed with:   Anesthesia Plan Comments:          Anesthesia Quick Evaluation

## 2023-05-16 NOTE — H&P (Addendum)
 OBSTETRIC ADMISSION HISTORY AND PHYSICAL  Julia Potter is a 33 y.o. female [phone] with IUP at [redacted]w[redacted]d by LMP confirmed w/ [place] presenting for IOL for pre-e. She reports +FMs, No LOF, no VB, no blurry vision, headaches or peripheral edema, and RUQ pain.  She plans on bottle feeding. She request PP IUD for birth control. She received her prenatal care at [facility]   Dating: By LMP, cf/w [place] --->  Estimated Date of Delivery: 06/06/23  Sono:    @[redacted]w[redacted]d , CWD, possible R renal agenesis vs. Pelvic kidney on anatomy scan, cephalic presentation, posterior lie, 2865g, 31% EFW   Prenatal History/Complications:  - Pre-Eclampsia -HSV - Rh negative -Chronic HTN -Obesity, BMI 44.7 -Hx of severe pre-e -hx of delayed PP hemorrhage  Past Medical History: Past Medical History:  Diagnosis Date   Abnormal uterine bleeding (AUB) 01/20/2016   ALLERGIC RHINITIS    Anemia, postpartum 10/12/2019   Anxiety    no meds   Chlamydia    Chronic hypertension in pregnancy 09/29/2019   Chronic hypertension with superimposed severe preeclampsia 10/09/2019   Depression    doing ok, in therapy - no meds   Herpes    History of multiple miscarriages    History of PCOS 04/25/2019   History of postpartum hemorrhage, currently pregnant    HSV infection    Preeclampsia 2010   resolved after pregnancy   Rh negative state in antepartum period 05/10/2017   Seasonal allergies     Past Surgical History: Past Surgical History:  Procedure Laterality Date   DILATION AND EVACUATION N/A 05/23/2017   Procedure: DILATATION AND EVACUATION;  Surgeon: Winchester Bing, MD;  Location: [facility];  Service: Gynecology;  Laterality: N/A;  needs [place]   INTRAUTERINE DEVICE INSERTION  09/2010   IUD REMOVAL      Obstetrical History: OB History     Gravida  6   Para  2   Term  0   Preterm  2   AB  3   Living  2      SAB  3   IAB  0   Ectopic  0   Multiple  0   Live Births  2        Obstetric Comments  [id number]  Admitted to [facility] for PIH, IOL. Delivered a viable female at 72 weeks         Social History Social History   Socioeconomic History   Marital status: Single    Spouse name: Not on file   Number of children: Not on file   Years of education: Not on file   Highest education level: Some college, no degree  Occupational History   Not on file  Tobacco Use   Smoking status: Never   Smokeless tobacco: Never  Vaping Use   Vaping status: Never Used  Substance and Sexual Activity   Alcohol use: Not Currently    Comment: socially   Drug use: No   Sexual activity: Yes    Birth control/protection: None  Other Topics Concern   Not on file  Social History Narrative   Lives with mom, sister and brother   Has one daughter born in [birth year] [address]   Wants to go to law school   Social Drivers of Health   Financial Resource Strain: Low Risk  (03/13/2023)   Overall Financial Resource Strain (CARDIA)    Difficulty of Paying Living Expenses: Not hard at all  Food Insecurity: No Food Insecurity (05/16/2023)   Hunger  Vital Sign    Worried About Programme researcher, broadcasting/film/video in the Last Year: Never true    Ran Out of Food in the Last Year: Never true  Transportation Needs: No Transportation Needs (05/16/2023)   PRAPARE - Administrator, Civil Service (Medical): No    Lack of Transportation (Non-Medical): No  Physical Activity: Insufficiently Active (03/13/2023)   Exercise Vital Sign    Days of Exercise per Week: 2 days    Minutes of Exercise per Session: 30 min  Stress: No Stress Concern Present (03/13/2023)   [employer] of Occupational Health - Occupational Stress Questionnaire    Feeling of Stress : Not at all  Social Connections: Moderately Isolated (03/13/2023)   Social Connection and Isolation Panel [NHANES]    Frequency of Communication with Friends and Family: More than three times a week    Frequency of Social Gatherings with Friends and Family: Once a week    Attends  Religious Services: Never    Database administrator or Organizations: No    Attends Engineer, structural: Not on file    Marital Status: Living with partner    Family History: Family History  Problem Relation Age of Onset   Hypertension Mother    Asthma Brother    Asthma Daughter    Diabetes Maternal Grandmother    Cancer Neg Hx    Heart disease Neg Hx     Allergies: No Known Allergies  Medications Prior to Admission  Medication Sig Dispense Refill Last Dose/Taking   acetaminophen (TYLENOL) 500 MG tablet Take 1,000 mg by mouth every 6 (six) hours as needed for moderate pain (pain score 4-6).      aspirin 81 MG chewable tablet Chew 2 tablets (162 mg total) by mouth daily. 180 tablet 2    diphenhydrAMINE (BENADRYL) 25 MG tablet Take 25 mg by mouth daily as needed for allergies.      NIFEdipine (PROCARDIA-XL/NIFEDICAL-XL) 30 MG 24 hr tablet Take 1 tablet (30 mg total) by mouth daily. 30 tablet 2    Prenatal Multivit-Min-FA ([employer] PRENATAL GUMMY) 0.4 MG CHEW Chew 1 Units by mouth daily. 90 tablet 1    valACYclovir (VALTREX) 500 MG tablet Take 1 tablet (500 mg) 2 times daily for 3 days for symptoms of outbreak 6 tablet PRN      Review of Systems   All systems reviewed and negative except as stated in HPI  Blood pressure 130/83, pulse (!) 104, temperature 98 F (36.7 C), temperature source Oral, resp. rate 20, height 5\' 2"  (1.575 m), weight 110.9 kg, last menstrual period 09/01/2022, unknown if currently breastfeeding. General appearance: alert, cooperative, and no distress Lungs: clear to auscultation bilaterally Heart: regular rate and rhythm Abdomen: soft, non-tender; bowel sounds normal Pelvic: pink, intact vaginal mucosa, no evidence of active HSV lesions on speculum exam Extremities: Homans sign is negative, no sign of DVT Presentation: cephalic Fetal monitoringBaseline: 135 bpm, Variability: Good {> 6 bpm), and Accelerations: Reactive Uterine  activityNone Dilation: 1 Effacement (%): 30 Station: -3 Exam by:: dr Lucianne Muss   Prenatal labs: ABO, Rh: --/--/PENDING (04/15 0815) Antibody: PENDING (04/15 0815) Rubella: 1.75 (10/21 1321) RPR: Non Reactive (02/10 1137)  HBsAg: Negative (10/21 1321)  HIV: Non Reactive (02/10 1137)  GBS: Negative/-- (04/07 1300)    Lab Results  Component Value Date   GBS Negative 05/08/2023   GTT Normal Genetic screening  NIPS, LR female, AFP neg Anatomy US concern for R renal agenesis vs pelvic kidney  Immunization History  Administered Date(s) Administered   HPV 9-valent 05/06/2015   Hepatitis A 05/29/2006, 06/11/2007   Hpv-Unspecified 05/29/2006, 07/31/2006   MMR 10/12/2019   Meningococcal Conjugate 05/05/2015   Meningococcal polysaccharide vaccine (MPSV4) 05/29/2006   PPD Test 07/11/2014, 11/17/2015   Td 05/29/2006   Tdap 05/05/2015, 08/16/2019, 03/20/2023   Varicella 05/29/2006    Prenatal Transfer Tool  Maternal Diabetes: No Genetic Screening: Normal Maternal Ultrasounds/Referrals: Fetal Kidney Anomalies Fetal Ultrasounds or other Referrals:  Referred to Materal Fetal Medicine  Maternal Substance Abuse:  No Significant Maternal Medications:  Meds include: Other: procardia, valtrex Significant Maternal Lab Results: Group B Strep negative and Rh negative Number of Prenatal Visits:greater than 3 verified prenatal visits Maternal Vaccinations:TDap Other Comments:  None   Results for orders placed or performed during the hospital encounter of 05/16/23 (from the past 24 hours)  Type and screen   Collection Time: 05/16/23  8:15 AM  Result Value Ref Range   ABO/RH(D) PENDING    Antibody Screen PENDING    Sample Expiration      05/19/2023,2359 Performed at [facility], [address], [place], [place] [address]   CBC   Collection Time: 05/16/23  8:16 AM  Result Value Ref Range   WBC 8.4 4.0 - 10.5 K/uL   RBC 3.96 3.87 - 5.11 MIL/uL   Hemoglobin 12.1 12.0 - 15.0 g/dL    HCT 60.4 (L) 54.0 - 46.0 %   MCV 90.7 80.0 - 100.0 fL   MCH 30.6 26.0 - 34.0 pg   MCHC 33.7 30.0 - 36.0 g/dL   RDW 98.1 19.1 - 47.8 %   Platelets 341 150 - 400 K/uL   nRBC 0.4 (H) 0.0 - 0.2 %  Results for orders placed or performed in visit on 05/15/23 (from the past 24 hours)  Comp Met (CMET)   Collection Time: 05/15/23 11:17 AM  Result Value Ref Range   Glucose 63 (L) 70 - 99 mg/dL   BUN 6 6 - 20 mg/dL   Creatinine, Ser 2.95 (L) 0.57 - 1.00 mg/dL   eGFR 621 >30 QM/VHQ/4.69   BUN/Creatinine Ratio 12 9 - 23   Sodium 134 134 - 144 mmol/L   Potassium 4.3 3.5 - 5.2 mmol/L   Chloride 102 96 - 106 mmol/L   CO2 17 (L) 20 - 29 mmol/L   Calcium 9.1 8.7 - 10.2 mg/dL   Total Protein 6.7 6.0 - 8.5 g/dL   Albumin 3.5 (L) 4.0 - 5.0 g/dL   Globulin, Total 3.2 1.5 - 4.5 g/dL   Bilirubin Total 0.2 0.0 - 1.2 mg/dL   Alkaline Phosphatase 180 (H) 44 - 121 IU/L   AST 13 0 - 40 IU/L   ALT 10 0 - 32 IU/L  CBC   Collection Time: 05/15/23 11:17 AM  Result Value Ref Range   WBC 8.8 3.4 - 10.8 x10E3/uL   RBC 4.10 3.77 - 5.28 x10E6/uL   Hemoglobin 12.4 11.1 - 15.9 g/dL   Hematocrit 62.9 52.8 - 46.6 %   MCV 93 79 - 97 fL   MCH 30.2 26.6 - 33.0 pg   MCHC 32.6 31.5 - 35.7 g/dL   RDW 41.3 24.4 - 01.0 %   Platelets 336 150 - 450 x10E3/uL   NRBC 1 (H) 0 - 0 %    Patient Active Problem List   Diagnosis Date Noted   Pre-eclampsia 05/16/2023   BMI 40.0-44.9, adult (HCC) 05/08/2023   Mild pre-eclampsia in third trimester 04/06/2023  Concern for right renal agnesis, pending confirmation @32wk  US  04/03/2023   Supervision of high risk pregnancy, antepartum 11/27/2022   History of severe pre-eclampsia 10/12/2019   History of delayed postpartum hemorrhage 10/10/2019   History of preterm delivery due to severe preeclampsia 04/25/2019   Chronic hypertension affecting pregnancy 04/18/2019   Obesity affecting pregnancy 04/18/2019   Rh negative state in antepartum period 04/18/2019   Herpes    Hirsutism  09/14/2015   Psychiatric illness 11/01/2013    Assessment/Plan:  DAWANDA MAPEL is a 33 y.o. [id number] at [redacted]w[redacted]d here for IOL due to pre-eclampsia  #Labor: Discussed induction process, proceed with dual cytotec #Pain: On demand, desires epidural #FWB: Cat I #GBS status:  negative #Feeding: Formula #Reproductive Life planning: Postplacental IUD Paragard  #Circ:  not applicable  #SIPE: On Procardia 30mg  daily, diagnosed w SIPE at 30 wk  known hx severe pre-e requiring Mag, will closely monitor for developing si/sx severe pre-e  #Hx delayed PPH: Given Cytotec, TXA, Hemabate, and Methergine series  no transfusion required, total EBL ~1200  #H/o HSV: No lesions noted on speculum exam  #Right pelvic kidney: Baby will need postnatal renal U/S  [name], DO 05/16/2023, 9:48 AM  Attestation of Supervision of Resident:  I confirm that I have verified the information documented in the resident's note and that I have also personally performed the history, physical exam and all medical decision making activities.  I have verified that all services and findings are accurately documented in this resident's note; and I agree with management and plan as outlined in the documentation. I have also made any necessary editorial changes.    Melanie Spires, MD OB Fellow, Faculty Practice [facility], Center for [facility]

## 2023-05-16 NOTE — Anesthesia Procedure Notes (Signed)
 Epidural Patient location during procedure: OB Start time: 05/16/2023 1:57 PM End time: 05/16/2023 2:11 PM  Staffing Anesthesiologist: Earvin Goldberg, MD Performed: anesthesiologist   Preanesthetic Checklist Completed: patient identified, IV checked, site marked, risks and benefits discussed, surgical consent, monitors and equipment checked, pre-op evaluation and timeout performed  Epidural Patient position: sitting Prep: ChloraPrep Patient monitoring: heart rate, cardiac monitor, continuous pulse ox and blood pressure Approach: midline Location: L2-L3 Injection technique: LOR saline  Needle:  Needle type: Tuohy  Needle gauge: 17 G Needle length: 9 cm Needle insertion depth: 9 cm Catheter type: closed end flexible Catheter size: 20 Guage Catheter at skin depth: 14 cm Test dose: negative  Assessment Events: blood not aspirated, injection not painful, no injection resistance, no paresthesia and negative IV test  Additional Notes Reason for block:procedure for pain

## 2023-05-16 NOTE — Progress Notes (Signed)
 Labor Progress Note Julia Potter is a 33 y.o. [id number] at [redacted]w[redacted]d presented for IOL d/t pre-e S: Patient is resting comfortably. She is feeling more pressure but is otherwise feeling well.   O:  BP (!) 149/92   Pulse (!) 112   Temp 98 F (36.7 C) (Oral)   Resp 18   Ht 5\' 2"  (1.575 m)   Wt 110.9 kg   LMP 09/01/2022   SpO2 100%   BMI 44.72 kg/m  EFM: 120/moderate variability/reactive accelerations  CVE: Dilation: 3.5 Effacement (%): 50 Station: 0 Presentation: Vertex Exam by:: dr Annabell Key   A&P: 33 y.o. [id number] [redacted]w[redacted]d for IOL d/t pre-e #Labor: Progressing well. AROM @1604  #Pain: epidural #FWB: Cat I #GBS negative  #CHTN w/ Pre-E, no severe features- pressures elevated since last check, 140s-150s/80s-90s but not in the severe range. Continue to monitor  [name], DO Center for [employer], [facility] 4:14 PM

## 2023-05-16 NOTE — Plan of Care (Signed)
   Problem: Education: Goal: Knowledge of General Education information will improve Description: Including pain rating scale, medication(s)/side effects and non-pharmacologic comfort measures Outcome: Completed/Met   Problem: Health Behavior/Discharge Planning: Goal: Ability to manage health-related needs will improve Outcome: Completed/Met   Problem: Clinical Measurements: Goal: Ability to maintain clinical measurements within normal limits will improve Outcome: Completed/Met Goal: Will remain free from infection Outcome: Completed/Met Goal: Diagnostic test results will improve Outcome: Completed/Met Goal: Respiratory complications will improve Outcome: Completed/Met Goal: Cardiovascular complication will be avoided Outcome: Completed/Met   Problem: Activity: Goal: Risk for activity intolerance will decrease Outcome: Completed/Met   Problem: Nutrition: Goal: Adequate nutrition will be maintained Outcome: Completed/Met   Problem: Coping: Goal: Level of anxiety will decrease Outcome: Completed/Met   Problem: Elimination: Goal: Will not experience complications related to bowel motility Outcome: Completed/Met Goal: Will not experience complications related to urinary retention Outcome: Completed/Met   Problem: Pain Managment: Goal: General experience of comfort will improve and/or be controlled Outcome: Completed/Met   Problem: Safety: Goal: Ability to remain free from injury will improve Outcome: Completed/Met   Problem: Skin Integrity: Goal: Risk for impaired skin integrity will decrease Outcome: Completed/Met   Problem: Education: Goal: Knowledge of Childbirth will improve Outcome: Completed/Met Goal: Ability to make informed decisions regarding treatment and plan of care will improve Outcome: Completed/Met Goal: Ability to state and carry out methods to decrease the pain will improve Outcome: Completed/Met Goal: Individualized Educational Video(s) Outcome:  Completed/Met   Problem: Coping: Goal: Ability to verbalize concerns and feelings about labor and delivery will improve Outcome: Completed/Met   Problem: Life Cycle: Goal: Ability to make normal progression through stages of labor will improve Outcome: Completed/Met Goal: Ability to effectively push during vaginal delivery will improve Outcome: Completed/Met   Problem: Role Relationship: Goal: Will demonstrate positive interactions with the child Outcome: Completed/Met   Problem: Safety: Goal: Risk of complications during labor and delivery will decrease Outcome: Completed/Met   Problem: Pain Management: Goal: Relief or control of pain from uterine contractions will improve Outcome: Completed/Met

## 2023-05-16 NOTE — Discharge Summary (Signed)
 Postpartum Discharge Summary    Patient Name: Julia Potter DOB: 05-06-1990 MRN: [id number]  Date of admission: 05/16/2023 Delivery date:05/16/2023 Delivering provider: Melanie Spires Date of discharge: 05/18/2023  Admitting diagnosis: Pre-eclampsia [O14.90] Intrauterine pregnancy: [redacted]w[redacted]d     Secondary diagnosis:  Principal Problem:   Pre-eclampsia Active Problems:   Chronic hypertension affecting pregnancy   Obesity affecting pregnancy   Herpes   Rh negative state in antepartum period  Additional problems:  -Pre-Eclampsia -Chronic HTN -HSV -RH neg -Obesity, BMI 44.7 -Hx of severe pre-e -hx of delayed PP hemorrhage    Discharge diagnosis: Term Pregnancy Delivered, Preeclampsia (mild), and Postpartum uterine atony (required JADA)                                               Post partum procedures: RhoGAM Augmentation: AROM, Pitocin , and Cytotec  Complications: None  Hospital course: Induction of Labor With Vaginal Delivery   33 y.o. yo [phone] at [redacted]w[redacted]d was admitted to the hospital 05/16/2023 for induction of labor.  Indication for induction: Preeclampsia.  Patient had an labor course complicated by postpartum uterine atony with EBL 600cc requiring placement of JADA Membrane Rupture Time/Date: 4:01 PM,05/16/2023  Delivery Method:Vaginal, Spontaneous Operative Delivery:N/A Episiotomy: None Lacerations:  None Details of delivery can be found in separate delivery note.  Patient had a postpartum course complicated by nothing. Patient is discharged home 05/18/23.  Newborn Data: Birth date:05/16/2023 Birth time:7:30 PM Gender:Female Living status:Living Apgars:8 ,9  Weight:2810 g  Magnesium  Sulfate received: No BMZ received: No Rhophylac :Yes MMR:N/A T-DaP:Given prenatally Flu: N/A RSV Vaccine received: No Transfusion:No  Immunizations received: Immunization History  Administered Date(s) Administered   HPV 9-valent 05/06/2015   Hepatitis A 05/29/2006, 06/11/2007    Hpv-Unspecified 05/29/2006, 07/31/2006   MMR 10/12/2019   Meningococcal Conjugate 05/05/2015   Meningococcal polysaccharide vaccine (MPSV4) 05/29/2006   PPD Test 07/11/2014, 11/17/2015   Td 05/29/2006   Tdap 05/05/2015, 08/16/2019, 03/20/2023   Varicella 05/29/2006    Physical exam  Vitals:   05/17/23 2121 05/18/23 0523 05/18/23 1051 05/18/23 1241  BP: 128/80 120/80 127/76 130/89  Pulse: 90 84 79 100  Resp: 18 16  18   Temp: 98.3 F (36.8 C) 98 F (36.7 C)  98.6 F (37 C)  TempSrc: Oral Oral  Oral  SpO2: 100% 100%  100%  Weight:      Height:       General: alert, cooperative, and no distress Lochia: appropriate Uterine Fundus: firm Incision: N/A DVT Evaluation: No cords or calf tenderness. No significant calf/ankle edema. Labs: Lab Results  Component Value Date   WBC 14.0 (H) 05/17/2023   HGB 10.1 (L) 05/17/2023   HCT 28.7 (L) 05/17/2023   MCV 90.3 05/17/2023   PLT 292 05/17/2023      Latest Ref Rng & Units 05/15/2023   11:17 AM  CMP  Glucose 70 - 99 mg/dL 63   BUN 6 - 20 mg/dL 6   Creatinine 1.47 - 8.29 mg/dL 5.62   Sodium 130 - 865 mmol/L 134   Potassium 3.5 - 5.2 mmol/L 4.3   Chloride 96 - 106 mmol/L 102   CO2 20 - 29 mmol/L 17   Calcium 8.7 - 10.2 mg/dL 9.1   Total Protein 6.0 - 8.5 g/dL 6.7   Total Bilirubin 0.0 - 1.2 mg/dL 0.2   Alkaline Phos 44 - 121 IU/L 180  AST 0 - 40 IU/L 13   ALT 0 - 32 IU/L 10    Edinburgh Score:    05/17/2023    9:55 AM  Edinburgh Postnatal Depression Scale Screening Tool  I have been able to laugh and see the funny side of things. 0  I have looked forward with enjoyment to things. 0  I have blamed myself unnecessarily when things went wrong. 0  I have been anxious or worried for no good reason. 0  I have felt scared or panicky for no good reason. 0  Things have been getting on top of me. 0  I have been so unhappy that I have had difficulty sleeping. 0  I have felt sad or miserable. 0  I have been so unhappy that I  have been crying. 0  The thought of harming myself has occurred to me. 0  Edinburgh Postnatal Depression Scale Total 0   Edinburgh Postnatal Depression Scale Total: 0   After visit meds:  Allergies as of 05/18/2023   No Known Allergies      Medication List     STOP taking these medications    acetaminophen  500 MG tablet Commonly known as: TYLENOL    aspirin  81 MG chewable tablet       TAKE these medications    [facility]S Prenatal Gummy 0.4 MG Chew Chew 1 Units by mouth daily.   diphenhydrAMINE  25 MG tablet Commonly known as: BENADRYL  Take 25 mg by mouth daily as needed for allergies.   furosemide  20 MG tablet Commonly known as: LASIX  Take 1 tablet (20 mg total) by mouth daily. Start taking on: May 19, 2023   ibuprofen  800 MG tablet Commonly known as: ADVIL  Take 1 tablet (800 mg total) by mouth every 8 (eight) hours.   NIFEdipine  30 MG 24 hr tablet Commonly known as: PROCARDIA -XL/NIFEDICAL-XL Take 1 tablet (30 mg total) by mouth daily.   oxyCODONE  5 MG immediate release tablet Commonly known as: Oxy IR/ROXICODONE  Take 1 tablet (5 mg total) by mouth every 6 (six) hours as needed for severe pain (pain score 7-10) or breakthrough pain.   potassium chloride  SA 20 MEQ tablet Commonly known as: KLOR-CON  M Take 2 tablets (40 mEq total) by mouth daily. Start taking on: May 19, 2023   senna-docusate 8.6-50 MG tablet Commonly known as: Senokot-S Take 2 tablets by mouth daily. Start taking on: May 19, 2023   valACYclovir  500 MG tablet Commonly known as: Valtrex  Take 1 tablet (500 mg) 2 times daily for 3 days for symptoms of outbreak         Discharge home in stable condition Infant Feeding:  Formula Infant Disposition:home with mother Discharge instruction: per After Visit Summary and Postpartum booklet. Activity: Advance as tolerated. Pelvic rest for 6 weeks.  Diet: routine diet Future Appointments: Future Appointments  Date Time Provider Department  Center  05/22/2023 11:00 AM Alvstad, Kristin L, RPH-CPP [facility] None  05/29/2023  2:00 PM [facility] NURSE [facility]  06/28/2023  1:35 PM Davis, Devon E, PA-C [facility]  07/11/2023 11:00 AM Tobb, Kardie, DO [facility] None   Follow up Visit: Message sent to [facility] 4/15  Please schedule this patient for a In person postpartum visit in 6 weeks with the following provider: Any provider. Additional Postpartum F/U:BP check 1 week  High risk pregnancy complicated by: HTN Delivery mode:  Vaginal, Spontaneous Anticipated Birth Control:  IUD (desires Paragard  outpatient)   05/18/2023 Melanie Spires, MD

## 2023-05-16 NOTE — Progress Notes (Signed)
 Labor Progress Note Julia Potter is a 33 y.o. [id number] at [redacted]w[redacted]d presented for IOL due to pre-e  S: Patient is resting comfortably. Contractions are irregular, and spaced out, but manageable at this time.   O:  BP (!) 143/85   Pulse (!) 113   Temp 98.3 F (36.8 C) (Oral)   Resp 20   Ht 5\' 2"  (1.575 m)   Wt 110.9 kg   LMP 09/01/2022   BMI 44.72 kg/m  EFM: 130/moderate variability/reactive accelerations  CVE: Dilation: 3.5 Effacement (%): 30 Station: -3 Presentation: Vertex Exam by:: dr Annabell Key   A&P: 33 y.o. [id number] [redacted]w[redacted]d for IOL #Labor: Progressing well. Discussed AROM and starting pitocin after placement of epidural #Pain: Desires epidural #FWB: Cat I #GBS negative  #SIPE- pressures stable, continue to monitor for developing si/sx  [name], DO [facility] for [employer], [facility] 1:50 PM

## 2023-05-17 ENCOUNTER — Other Ambulatory Visit: Payer: Self-pay

## 2023-05-17 LAB — CBC
HCT: 28.7 % — ABNORMAL LOW (ref 36.0–46.0)
Hemoglobin: 10.1 g/dL — ABNORMAL LOW (ref 12.0–15.0)
MCH: 31.8 pg (ref 26.0–34.0)
MCHC: 35.2 g/dL (ref 30.0–36.0)
MCV: 90.3 fL (ref 80.0–100.0)
Platelets: 292 10*3/uL (ref 150–400)
RBC: 3.18 MIL/uL — ABNORMAL LOW (ref 3.87–5.11)
RDW: 13.3 % (ref 11.5–15.5)
WBC: 14 10*3/uL — ABNORMAL HIGH (ref 4.0–10.5)
nRBC: 0.1 % (ref 0.0–0.2)

## 2023-05-17 MED ORDER — FUROSEMIDE 20 MG PO TABS
20.0000 mg | ORAL_TABLET | Freq: Every day | ORAL | Status: DC
  Administered 2023-05-17 – 2023-05-18 (×2): 20 mg via ORAL
  Filled 2023-05-17 (×2): qty 1

## 2023-05-17 MED ORDER — POTASSIUM CHLORIDE CRYS ER 20 MEQ PO TBCR
40.0000 meq | EXTENDED_RELEASE_TABLET | Freq: Every day | ORAL | Status: DC
  Administered 2023-05-17 – 2023-05-18 (×2): 40 meq via ORAL
  Filled 2023-05-17 (×2): qty 2

## 2023-05-17 MED ORDER — SENNOSIDES-DOCUSATE SODIUM 8.6-50 MG PO TABS
2.0000 | ORAL_TABLET | ORAL | Status: DC
  Administered 2023-05-17 – 2023-05-18 (×2): 2 via ORAL
  Filled 2023-05-17 (×2): qty 2

## 2023-05-17 MED ORDER — DIBUCAINE (PERIANAL) 1 % EX OINT: 1.0000 | TOPICAL_OINTMENT | CUTANEOUS | Status: DC | PRN

## 2023-05-17 MED ORDER — CEFAZOLIN SODIUM-DEXTROSE 2-4 GM/100ML-% IV SOLN
2.0000 g | Freq: Once | INTRAVENOUS | Status: AC
  Administered 2023-05-17: 2 g via INTRAVENOUS
  Filled 2023-05-17: qty 100

## 2023-05-17 MED ORDER — ONDANSETRON HCL 4 MG/2ML IJ SOLN: 4.0000 mg | INTRAMUSCULAR | Status: DC | PRN

## 2023-05-17 MED ORDER — WITCH HAZEL-GLYCERIN EX PADS: 1.0000 | MEDICATED_PAD | CUTANEOUS | Status: DC | PRN

## 2023-05-17 MED ORDER — PRENATAL MULTIVITAMIN CH
1.0000 | ORAL_TABLET | Freq: Every day | ORAL | Status: DC
  Administered 2023-05-17: 1 via ORAL
  Filled 2023-05-17 (×2): qty 1

## 2023-05-17 MED ORDER — DIPHENHYDRAMINE HCL 25 MG PO CAPS: 25.0000 mg | ORAL_CAPSULE | Freq: Four times a day (QID) | ORAL | Status: DC | PRN

## 2023-05-17 MED ORDER — SODIUM CHLORIDE 0.9% FLUSH: 3.0000 mL | INTRAVENOUS | Status: DC | PRN

## 2023-05-17 MED ORDER — ACETAMINOPHEN 325 MG PO TABS
650.0000 mg | ORAL_TABLET | ORAL | Status: DC | PRN
  Administered 2023-05-17 – 2023-05-18 (×3): 650 mg via ORAL
  Filled 2023-05-17 (×3): qty 2

## 2023-05-17 MED ORDER — ONDANSETRON HCL 4 MG PO TABS: 4.0000 mg | ORAL_TABLET | ORAL | Status: DC | PRN

## 2023-05-17 MED ORDER — SODIUM CHLORIDE 0.9 % IV SOLN: 250.0000 mL | INTRAVENOUS | Status: DC | PRN

## 2023-05-17 MED ORDER — SIMETHICONE 80 MG PO CHEW: 80.0000 mg | CHEWABLE_TABLET | ORAL | Status: DC | PRN

## 2023-05-17 MED ORDER — OXYCODONE HCL 5 MG PO TABS
5.0000 mg | ORAL_TABLET | Freq: Four times a day (QID) | ORAL | Status: DC | PRN
  Administered 2023-05-17 – 2023-05-18 (×2): 5 mg via ORAL
  Filled 2023-05-17 (×2): qty 1

## 2023-05-17 MED ORDER — SODIUM CHLORIDE 0.9% FLUSH
3.0000 mL | Freq: Two times a day (BID) | INTRAVENOUS | Status: DC
  Administered 2023-05-17: 3 mL via INTRAVENOUS

## 2023-05-17 MED ORDER — BENZOCAINE-MENTHOL 20-0.5 % EX AERO
1.0000 | INHALATION_SPRAY | CUTANEOUS | Status: DC | PRN
  Filled 2023-05-17: qty 56

## 2023-05-17 MED ORDER — RHO D IMMUNE GLOBULIN 1500 UNIT/2ML IJ SOSY
300.0000 ug | PREFILLED_SYRINGE | Freq: Once | INTRAMUSCULAR | Status: AC
  Administered 2023-05-17: 300 ug via INTRAVENOUS
  Filled 2023-05-17: qty 2

## 2023-05-17 MED ORDER — COCONUT OIL OIL: 1.0000 | TOPICAL_OIL | Status: DC | PRN

## 2023-05-17 NOTE — Plan of Care (Signed)

## 2023-05-17 NOTE — Lactation Note (Signed)
 This note was copied from a baby's chart. Lactation Consultation Note  Patient Name: Julia Potter [id number] Date: 05/17/2023 Age:31 hours  Reason for consult: Initial assessment;Mother's request;Primapara;1st time breastfeeding;Early term 37-38.6wks  P3, [redacted]w[redacted]d  Mother's feeding plan was formula feeding only however baby is not showing interest in bottle feeding. Mother states baby is latching well and roots towards her with greater interest than bottle. Baby breast fed for 10 min within the hour. Baby showing feeding cues and baby brought to breast.   Basic breastfeeding education with baby in football and cross cradle hold. Baby latched and suckled intermittently. Mother was taught hand expression . Beads of colostrum noted. Mother's breast are soft. She reports her milk came in with her last child, now 41 years old.   Mother is still working on breast and formula feeding. Mother encouraged to latch baby with feeding cues, place baby skin to skin if not latching, and call for assistance with breastfeeding as needed. Anticipate as baby approaches 24 hours of age, baby will breastfeed more often 8-12 plus times in 24 hours and may "cluster feed".      Maternal Data Has patient been taught Hand Expression?: Yes Does the patient have breastfeeding experience prior to this delivery?: No  Feeding Mother's Current Feeding Choice: Breast Milk and Formula Nipple Type: Slow - flow  LATCH Score Latch: Repeated attempts needed to sustain latch, nipple held in mouth throughout feeding, stimulation needed to elicit sucking reflex.  Audible Swallowing: None  Type of Nipple: Everted at rest and after stimulation  Comfort (Breast/Nipple): Soft / non-tender  Hold (Positioning): Assistance needed to correctly position infant at breast and maintain latch.  LATCH Score: 6      Interventions Interventions: Breast feeding basics reviewed;Assisted with latch;Skin to skin;Hand express;Adjust  position;Support pillows;Education;CDC milk storage guidelines;CDC Guidelines for Breast Pump Cleaning     Consult Status Consult Status: Follow-up Date: 05/18/23 Follow-up type: In-patient    Gearline Kell M 05/17/2023, 5:25 PM

## 2023-05-17 NOTE — Social Work (Signed)
 MOB was referred for history of depression/anxiety.  * Referral screened out by Clinical Social Worker because none of the following criteria appear to apply:  ~ History of anxiety/depression during this pregnancy, or of post-partum depression following prior delivery.  ~ Diagnosis of anxiety and/or depression within last 3 years OR * MOB's symptoms currently being treated with medication and/or therapy.  Per chart review MOB diagnosis was prior to April 2022, per [facility] records no MH concerns noted during this pregnancy. Edinburgh=0  Please contact the Clinical Social Worker if needs arise, or by MOB request.  Haroldine Likens, LCSWA Clinical Social Worker [phone]

## 2023-05-17 NOTE — Anesthesia Postprocedure Evaluation (Signed)
 Anesthesia Post Note  Patient: Julia Potter  Procedure(s) Performed: AN AD HOC LABOR EPIDURAL     Patient location during evaluation: Mother Baby Anesthesia Type: Epidural Level of consciousness: awake and alert and oriented Pain management: satisfactory to patient Vital Signs Assessment: post-procedure vital signs reviewed and stable Respiratory status: respiratory function stable Cardiovascular status: stable Postop Assessment: no headache, no backache, epidural receding, patient able to bend at knees, no signs of nausea or vomiting, adequate PO intake and able to ambulate Anesthetic complications: no   No notable events documented.  Last Vitals:  Vitals:   05/17/23 0100 05/17/23 0510  BP: 119/76 118/70  Pulse: (!) 110 (!) 107  Resp: 18 18  Temp: 37.1 C 37.1 C  SpO2: 98% 100%    Last Pain:  Vitals:   05/17/23 0512  TempSrc:   PainSc: 8    Pain Goal:                   Laelah Siravo

## 2023-05-17 NOTE — Progress Notes (Signed)
 POSTPARTUM PROGRESS NOTE  Post Partum Day 1 Subjective:  Georgette I Pujol is a 33 y.o. [id number] [redacted]w[redacted]d s/p nsvd.  No acute events overnight.  Pt denies problems with ambulating, voiding or po intake.  She denies nausea or vomiting.  Pain is moderately controlled.  She has had flatus. She has not had bowel movement.  Lochia Small.   Objective: Blood pressure 124/85, pulse 97, temperature 98.4 F (36.9 C), temperature source Oral, resp. rate 18, height 5\' 2"  (1.575 m), weight 110.9 kg, last menstrual period 09/01/2022, SpO2 98%, unknown if currently breastfeeding.  Physical Exam:  General: alert, cooperative and no distress Lochia:normal flow Chest: CTAB Heart: RRR no m/r/g Abdomen: +BS, soft, nontender,  Uterine Fundus: firm,  DVT Evaluation: No calf swelling or tenderness Extremities: no edema  Recent Labs    05/16/23 0816 05/17/23 0454  HGB 12.1 10.1*  HCT 35.9* 28.7*    Assessment/Plan:  ASSESSMENT: ZENAIDA TESAR is a 33 y.o. [id number] [redacted]w[redacted]d s/p nsvd, doing well. No preE symptoms, BPs appropriate on lasix and procardia. Hgb 10.1 today, asymptomatic, bleeding controlled (s/p pph meds including jada), qbl 751. Outpt iud.  Plan for discharge tomorrow   LOS: 1 day   Raymonde Calico 05/17/2023, 1:14 PM

## 2023-05-18 ENCOUNTER — Encounter: Admitting: Obstetrics and Gynecology

## 2023-05-18 ENCOUNTER — Other Ambulatory Visit (HOSPITAL_COMMUNITY): Payer: Self-pay

## 2023-05-18 LAB — RH IG WORKUP (INCLUDES ABO/RH)
Fetal Screen: NEGATIVE
Gestational Age(Wks): 37
Unit division: 0

## 2023-05-18 MED ORDER — FUROSEMIDE 20 MG PO TABS
20.0000 mg | ORAL_TABLET | Freq: Every day | ORAL | 0 refills | Status: AC
  Filled 2023-05-18: qty 6, 6d supply, fill #0

## 2023-05-18 MED ORDER — POTASSIUM CHLORIDE CRYS ER 20 MEQ PO TBCR
40.0000 meq | EXTENDED_RELEASE_TABLET | Freq: Every day | ORAL | 0 refills | Status: AC
  Filled 2023-05-18: qty 6, 3d supply, fill #0

## 2023-05-18 MED ORDER — OXYCODONE HCL 5 MG PO TABS
5.0000 mg | ORAL_TABLET | Freq: Four times a day (QID) | ORAL | 0 refills | Status: DC | PRN
  Filled 2023-05-18: qty 5, 2d supply, fill #0

## 2023-05-18 MED ORDER — IBUPROFEN 800 MG PO TABS
800.0000 mg | ORAL_TABLET | Freq: Three times a day (TID) | ORAL | 0 refills | Status: AC
  Filled 2023-05-18: qty 30, 10d supply, fill #0

## 2023-05-18 MED ORDER — SENNOSIDES-DOCUSATE SODIUM 8.6-50 MG PO TABS
2.0000 | ORAL_TABLET | ORAL | 0 refills | Status: AC
  Filled 2023-05-18: qty 60, 30d supply, fill #0

## 2023-05-18 NOTE — Lactation Note (Signed)
 This note was copied from a baby's chart. Lactation Consultation Note  Patient Name: Julia Potter [id number] Date: 05/18/2023 Age:31 hours Reason for consult: Follow-up assessment;Maternal endocrine disorder;Early term 37-38.6wks  P3, 37 wks, @ 45 hrs of life. Discharge anticipated today. Encouraged mom to keep working on big mouth latch with baby and use EBM or coconut oil after each feed. Discussed cluster feeding overnight/ early morning brings in our milk supply, shared expectations of milk coming in. Highlighted risk of engorgement. Discussed hand pump/express to soften breasts, motrin as anti-inflammatory, and ice packs for 10-20 minutes post feed/pumping if still over-full is the best treatments for inflamed/engorged breasts.  Feeding Mother's Current Feeding Choice: Breast Milk and Formula Nipple Type: Nfant Standard Flow (white)  LLactation Tools Discussed/Used Tools: Pump Breast pump type: Manual Pump Education: Milk Storage  Interventions    Discharge Discharge Education: Engorgement and breast care Pump: DEBP;Personal;Manual (Per mom has an electric pump @ home, manual pump provided- highlighted best for softening over-full/ swollen engorged breast)  Consult Status Consult Status: Complete Date: 05/18/23 Follow-up type: In-patient    Arlis Yale 05/18/2023, 4:54 PM

## 2023-05-22 ENCOUNTER — Telehealth: Payer: Self-pay

## 2023-05-22 ENCOUNTER — Ambulatory Visit: Admitting: Pharmacist Clinician (PhC)/ Clinical Pharmacy Specialist

## 2023-05-22 NOTE — Telephone Encounter (Signed)
 Called to check in 3-4 days after discharge for IMPACT Mom study. No answer. Left VM to return call.

## 2023-05-22 NOTE — Progress Notes (Signed)
  Progress Note   Date: 05/18/2023  Patient Name: Julia Potter        MRN#: [id number]  Clarification of diagnosis:  Acute blood loss anemia  This was normal postpartum blood loss.  She did have atony, but it was addressed quickly with then JADA before blood loss was excessive.

## 2023-05-22 NOTE — Progress Notes (Deleted)
 Office Visit    Patient Name: Julia Potter Date of Encounter: 05/22/2023  Primary Care Provider:  Catheryn Cluck, MD Primary Cardiologist:  Kardie Tobb, DO  Chief Complaint    Hypertension in pregnancy; pre-eclampsia  Significant Past Medical History                    No Known Allergies  History of Present Illness    Julia Potter is a 33 y.o. female patient of Dr Emmette Harms, in the office today for post-partum hypertension follow up.    Blood Pressure Goal:  130/80  Current Medications:    Previously tried:    Family Hx:     Social Hx:      Tobacco:  Alcohol:  Caffeine: Diet:      Exercise:   Home BP readings:      Adherence Assessment  Do you ever forget to take your medication? [] Yes [] No  Do you ever skip doses due to side effects? [] Yes [] No  Do you have trouble affording your medicines? [] Yes [] No  Are you ever unable to pick up your medication due to transportation difficulties? [] Yes [] No  Do you ever stop taking your medications because you don't believe they are helping? [] Yes [] No  Do you check your weight daily? [] Yes [] No   Adherence strategy: ***  Barriers to obtaining medications: ***     Accessory Clinical Findings    Lab Results  Component Value Date   CREATININE 0.50 (L) 05/15/2023   BUN 6 05/15/2023   NA 134 05/15/2023   K 4.3 05/15/2023   CL 102 05/15/2023   CO2 17 (L) 05/15/2023   Lab Results  Component Value Date   ALT 10 05/15/2023   AST 13 05/15/2023   ALKPHOS 180 (H) 05/15/2023   BILITOT 0.2 05/15/2023   Lab Results  Component Value Date   HGBA1C 5.1 11/21/2022    Home Medications    Current Outpatient Medications  Medication Sig Dispense Refill   diphenhydrAMINE  (BENADRYL ) 25 MG tablet Take 25 mg by mouth daily as needed for allergies.     furosemide  (LASIX ) 20 MG tablet Take 1 tablet (20 mg total) by mouth daily. 6 tablet 0   ibuprofen  (ADVIL ) 800 MG tablet Take 1 tablet (800 mg total) by  mouth every 8 (eight) hours. 30 tablet 0   NIFEdipine  (PROCARDIA -XL/NIFEDICAL-XL) 30 MG 24 hr tablet Take 1 tablet (30 mg total) by mouth daily. 30 tablet 2   oxyCODONE  (OXY IR/ROXICODONE ) 5 MG immediate release tablet Take 1 tablet (5 mg total) by mouth every 6 (six) hours as needed for severe pain (pain score 7-10) or breakthrough pain. 5 tablet 0   potassium chloride  SA (KLOR-CON  M) 20 MEQ tablet Take 2 tablets (40 mEq total) by mouth daily. 6 tablet 0   Prenatal Multivit-Min-FA ([employer] PRENATAL GUMMY) 0.4 MG CHEW Chew 1 Units by mouth daily. 90 tablet 1   senna-docusate (SENOKOT-S) 8.6-50 MG tablet Take 2 tablets by mouth daily. 60 tablet 0   valACYclovir  (VALTREX ) 500 MG tablet Take 1 tablet (500 mg) 2 times daily for 3 days for symptoms of outbreak 6 tablet PRN   No current facility-administered medications for this visit.     No BP recorded.  {Refresh Note OR Click here to enter BP  :1}***   Assessment & Plan    No problem-specific Assessment & Plan notes found for this encounter.   Donivan Furry PharmD CPP [facility]  [address] [place], [place] [address] [phone]

## 2023-05-23 ENCOUNTER — Telehealth: Payer: Self-pay

## 2023-05-23 NOTE — Telephone Encounter (Signed)
 Called to check in for IMPACT Mom study. No answer. Left VM to return call.

## 2023-05-24 ENCOUNTER — Telehealth (HOSPITAL_COMMUNITY): Payer: Self-pay | Admitting: *Deleted

## 2023-05-24 NOTE — Telephone Encounter (Signed)
 05/24/2023  Name: Julia Potter MRN: [id number] DOB: [birth year]  Reason for Call:  Transition of Care Hospital Discharge Call  Contact Status: Patient Contact Status: Complete  Language assistant needed: Interpreter Mode: Interpreter Not Needed        Follow-Up Questions: Do You Have Any Concerns About Your Health As You Heal From Delivery?: No Do You Have Any Concerns About Your Infants Health?: No  Edinburgh Postnatal Depression Scale:  In the Past 7 Days:    PHQ2-9 Depression Scale:     Discharge Follow-up: Edinburgh score requires follow up?:  (declined screening, said she was working and needed to end call, endorses she is doing well)  Post-discharge interventions: NA  Pearlie Bougie, RN 05/24/2023 15:06

## 2023-05-26 ENCOUNTER — Telehealth: Payer: Self-pay

## 2023-05-26 NOTE — Telephone Encounter (Signed)
 Called to check in for IMPACT Mom Study. No answer. Left VM to return call.

## 2023-05-29 ENCOUNTER — Ambulatory Visit: Admitting: *Deleted

## 2023-05-29 ENCOUNTER — Telehealth: Payer: Self-pay

## 2023-05-29 ENCOUNTER — Other Ambulatory Visit: Payer: Self-pay

## 2023-05-29 VITALS — BP 123/82 | HR 97 | Ht 62.0 in | Wt 228.0 lb

## 2023-05-29 DIAGNOSIS — Z0130 Encounter for examination of blood pressure without abnormal findings: Secondary | ICD-10-CM

## 2023-05-29 NOTE — Progress Notes (Signed)
 Here for BP check after IOL at 37 weeks for [facility] with Pre-eclampsia. Vaginal delivery 05/16/23. She denies headaches or visual disturbances or edema. She reports she is taking the Procardia  but hasn't taken it today. BP today 123/82. Advised to continue the procardia . Reviewed signs of pre-eclampsia and postpartum appointment. She voices understanding. Julia Potter

## 2023-05-29 NOTE — Telephone Encounter (Signed)
 Called to check in for IMPACT Mom Study. No answer. Left VM to return call.

## 2023-06-02 ENCOUNTER — Telehealth: Payer: Self-pay

## 2023-06-02 NOTE — Telephone Encounter (Signed)
 Called to check in for IMPACT Mom Study. No answer. Left VM to return call.

## 2023-06-05 ENCOUNTER — Telehealth: Payer: Self-pay

## 2023-06-05 NOTE — Telephone Encounter (Signed)
 Called to check in for IMPACT Mom Study. No answer. Left VM to return call.

## 2023-06-06 ENCOUNTER — Inpatient Hospital Stay (HOSPITAL_COMMUNITY): Admit: 2023-06-06

## 2023-06-27 ENCOUNTER — Telehealth: Payer: Self-pay

## 2023-06-27 NOTE — Telephone Encounter (Signed)
 Julia Potter called to make last attempt to complete weekly IMPACT MOM Study questions. No answer. Left VM to return call if she still wanted to be apart of the study,

## 2023-06-28 ENCOUNTER — Ambulatory Visit: Admitting: Physician Assistant

## 2023-07-03 ENCOUNTER — Ambulatory Visit: Admitting: Pharmacist Clinician (PhC)/ Clinical Pharmacy Specialist

## 2023-07-03 NOTE — Progress Notes (Deleted)
 Office Visit    Patient Name: [name] Date of Encounter: 07/03/2023  Primary Care Provider:  Catheryn Cluck, MD Primary Cardiologist:  Kardie Tobb, DO  Chief Complaint    Hypertension  Significant Past Medical History   Pre-eclampsia                 No Known Allergies  History of Present Illness    Julia Potter is a 33 y.o. female patient of Dr Emmette Harms, in the office today for hypertension evaluation.    Blood Pressure Goal:  130/80  Current Medications:  nifedipine  xl 30 mg every day, furosemide  20 mg every day   Previously tried:    Family Hx:     Social Hx:      Tobacco:  Alcohol:  Caffeine: Diet:      Exercise:   Home BP readings:      Adherence Assessment  Do you ever forget to take your medication? [] Yes [] No  Do you ever skip doses due to side effects? [] Yes [] No  Do you have trouble affording your medicines? [] Yes [] No  Are you ever unable to pick up your medication due to transportation difficulties? [] Yes [] No  Do you ever stop taking your medications because you don't believe they are helping? [] Yes [] No  Do you check your weight daily? [] Yes [] No   Adherence strategy: ***  Barriers to obtaining medications: ***     Accessory Clinical Findings    Lab Results  Component Value Date   CREATININE 0.50 (L) 05/15/2023   BUN 6 05/15/2023   NA 134 05/15/2023   K 4.3 05/15/2023   CL 102 05/15/2023   CO2 17 (L) 05/15/2023   Lab Results  Component Value Date   ALT 10 05/15/2023   AST 13 05/15/2023   ALKPHOS 180 (H) 05/15/2023   BILITOT 0.2 05/15/2023   Lab Results  Component Value Date   HGBA1C 5.1 11/21/2022    Home Medications    Current Outpatient Medications  Medication Sig Dispense Refill   diphenhydrAMINE  (BENADRYL ) 25 MG tablet Take 25 mg by mouth daily as needed for allergies.     furosemide  (LASIX ) 20 MG tablet Take 1 tablet (20 mg total) by mouth daily. 6 tablet 0   ibuprofen  (ADVIL ) 800 MG tablet  Take 1 tablet (800 mg total) by mouth every 8 (eight) hours. 30 tablet 0   NIFEdipine  (PROCARDIA -XL/NIFEDICAL-XL) 30 MG 24 hr tablet Take 1 tablet (30 mg total) by mouth daily. 30 tablet 2   oxyCODONE  (OXY IR/ROXICODONE ) 5 MG immediate release tablet Take 1 tablet (5 mg total) by mouth every 6 (six) hours as needed for severe pain (pain score 7-10) or breakthrough pain. 5 tablet 0   potassium chloride  SA (KLOR-CON  M) 20 MEQ tablet Take 2 tablets (40 mEq total) by mouth daily. 6 tablet 0   Prenatal Multivit-Min-FA ([employer] PRENATAL GUMMY) 0.4 MG CHEW Chew 1 Units by mouth daily. (Patient not taking: Reported on 05/29/2023) 90 tablet 1   senna-docusate (SENOKOT-S) 8.6-50 MG tablet Take 2 tablets by mouth daily. 60 tablet 0   valACYclovir  (VALTREX ) 500 MG tablet Take 1 tablet (500 mg) 2 times daily for 3 days for symptoms of outbreak (Patient not taking: Reported on 05/29/2023) 6 tablet PRN   No current facility-administered medications for this visit.     No BP recorded.  {Refresh Note OR Click here to enter BP  :1}***   Assessment & Plan    No problem-specific Assessment &  Plan notes found for this encounter.   Nyaira Hodgens PharmD CPP [facility]  [address] [place], [place] [address] [phone]

## 2023-07-11 ENCOUNTER — Ambulatory Visit: Admitting: Cardiology

## 2023-07-24 ENCOUNTER — Ambulatory Visit: Admitting: Obstetrics and Gynecology

## 2023-07-24 ENCOUNTER — Encounter: Payer: Self-pay | Admitting: Obstetrics and Gynecology

## 2023-07-24 ENCOUNTER — Other Ambulatory Visit: Payer: Self-pay

## 2023-07-24 DIAGNOSIS — Z1332 Encounter for screening for maternal depression: Secondary | ICD-10-CM | POA: Diagnosis not present

## 2023-07-24 DIAGNOSIS — O119 Pre-existing hypertension with pre-eclampsia, unspecified trimester: Secondary | ICD-10-CM

## 2023-07-24 DIAGNOSIS — Z30430 Encounter for insertion of intrauterine contraceptive device: Secondary | ICD-10-CM | POA: Diagnosis not present

## 2023-07-24 DIAGNOSIS — Z975 Presence of (intrauterine) contraceptive device: Secondary | ICD-10-CM | POA: Insufficient documentation

## 2023-07-24 DIAGNOSIS — O10919 Unspecified pre-existing hypertension complicating pregnancy, unspecified trimester: Secondary | ICD-10-CM

## 2023-07-24 MED ORDER — PARAGARD INTRAUTERINE COPPER IU IUD
1.0000 | INTRAUTERINE_SYSTEM | Freq: Once | INTRAUTERINE | Status: AC
  Administered 2023-07-24: 1 via INTRAUTERINE

## 2023-07-24 MED ORDER — IBUPROFEN 800 MG PO TABS
800.0000 mg | ORAL_TABLET | Freq: Once | ORAL | Status: AC
  Administered 2023-07-24: 800 mg via ORAL

## 2023-07-24 NOTE — Progress Notes (Signed)
 Post Partum Visit Note  Julia Potter is a 33 y.o. [phone] female who presents for a postpartum visit. She is 10 weeks postpartum following a normal spontaneous vaginal delivery.  I have fully reviewed the prenatal and intrapartum course. The delivery was at [redacted]w[redacted]d gestational weeks.  Anesthesia: epidural. Postpartum course has been going well. Baby is doing well. Baby is feeding by bottle - similac total comfort. Bleeding coming off menses. Bowel function is normal. Bladder function is normal. Patient is not sexually active. Contraception method is none. Postpartum depression screening: negative.   The pregnancy intention screening data noted above was reviewed. Potential methods of contraception were discussed. The patient elected to proceed with Paragard    Edinburgh Postnatal Depression Scale - 07/24/23 0837       Edinburgh Postnatal Depression Scale:  In the Past 7 Days   I have been able to laugh and see the funny side of things. 0    I have looked forward with enjoyment to things. 0    I have blamed myself unnecessarily when things went wrong. 2    I have been anxious or worried for no good reason. 1    I have felt scared or panicky for no good reason. 0    Things have been getting on top of me. 1    I have been so unhappy that I have had difficulty sleeping. 1    I have felt sad or miserable. 0    I have been so unhappy that I have been crying. 0    The thought of harming myself has occurred to me. 0    Edinburgh Postnatal Depression Scale Total 5          Health Maintenance Due  Topic Date Due   COVID-19 Vaccine (1 - 2024-25 season) Never done    The following portions of the patient's history were reviewed and updated as appropriate: allergies, current medications, past family history, past medical history, past social history, past surgical history, and problem list.  Review of Systems Pertinent items are noted in HPI.  Objective:  BP 133/87 (BP Location: Right  Arm, Patient Position: Sitting, Cuff Size: Large)   Pulse 79   Wt 224 lb (101.6 kg)   LMP 09/01/2022   SpO2 98%   BMI 40.97 kg/m    General:  alert and cooperative   Breasts:  not indicated  Lungs: Normal      Abdomen: Soft    Wound N/a  GU exam:  normal   IUD Insertion Procedure Note Patient identified, informed consent performed, consent signed.   Discussed risks of irregular bleeding, cramping, infection, malpositioning or misplacement of the IUD outside the uterus.Time out was performed.  Currently on menstrual cycle  Speculum placed in the vagina.  Cervix visualized.  Cleaned with Betadine x 2.  Grasped anteriorly with a single tooth tenaculum.  Uterus sounded to 7 cm.  Paragard  IUD placed per manufacturer's recommendations.  Strings trimmed to 3 cm. Tenaculum was removed, good hemostasis noted.  Patient tolerated procedure well.   Patient was given post-procedure instructions.  She was advised to have backup contraception for one week.  Patient was also asked to check IUD strings periodically and follow up in 4 weeks for IUD check.   Assessment:   1. Postpartum exam (Primary)   2. Chronic hypertension affecting pregnancy 3. Chronic hypertension with superimposed pre-eclampsia On nifedipine , was not on meds prior to pregnancy. Discussed follow up BP visit, will stop meds  two days prior to visit   4. Encounter for IUD insertion See insertion not above   Plan:   Essential components of care per ACOG recommendations:  1.  Mood and well being: Patient with negative depression screening today. Reviewed local resources for support.   2. Infant care and feeding:  -Patient currently breastmilk feeding? No.  -Social determinants of health (SDOH) reviewed in EPIC. No concerns  3. Sexuality, contraception and birth spacing - Patient does not want a pregnancy in the next year.  - Reviewed reproductive life planning. Reviewed contraceptive methods based on pt preferences and  effectiveness.  Patient desired IUD or IUS today.   - Discussed birth spacing of 18 months  4. Sleep and fatigue -Encouraged family/partner/community support of 4 hrs of uninterrupted sleep to help with mood and fatigue  5. Physical Recovery  - Discussed patients delivery and complications. She describes her labor as mixed. - Patient had a vaginal delivery with PPH requiring Jada. Patient had a no laceration. Perineal healing reviewed. Patient expressed understanding - Patient has urinary incontinence? No. - Patient is safe to resume physical and sexual activity  6.  Health Maintenance - HM due items addressed Yes - Last pap smear  Diagnosis  Date Value Ref Range Status  12/05/2022   Final   - Negative for intraepithelial lesion or malignancy (NILM)   Pap smear not done at today's visit.  -Breast Cancer screening indicated? No.   7. Chronic Disease/Pregnancy Condition follow up: Hypertension  - PCP follow up Future Appointments  Date Time Provider Department Center  07/31/2023 10:00 AM [facility] NURSE [facility]  09/11/2023  3:35 PM Cresenzo, Norleen GAILS, MD [facility]     Nidia Daring, FNP Center for [employer], [facility]

## 2023-07-24 NOTE — Patient Instructions (Addendum)
 IUD PLACEMENT POST-PROCEDURE INSTRUCTIONS  You may take Ibuprofen, Aleve or Tylenol for pain if needed.  Cramping should resolve within in 24 hours.  You may have a small amount of spotting.  You should wear a mini pad for the next few days.  You may have intercourse after 24 hours.  If you using this for birth control, it is effective immediately.  You need to call if you have any pelvic pain, fever, heavy bleeding or foul smelling vaginal discharge.  Irregular bleeding is common the first several months after having an IUD placed. You do not need to call for this reason unless you are concerned.  Shower or bathe as normal  You should have a follow-up appointment in 4-8 weeks for a re-check to make sure you are not having any problems.

## 2023-07-31 ENCOUNTER — Ambulatory Visit

## 2023-07-31 ENCOUNTER — Other Ambulatory Visit: Payer: Self-pay

## 2023-07-31 VITALS — BP 126/82 | HR 71 | Wt 222.8 lb

## 2023-07-31 DIAGNOSIS — O10919 Unspecified pre-existing hypertension complicating pregnancy, unspecified trimester: Secondary | ICD-10-CM

## 2023-07-31 DIAGNOSIS — Z0130 Encounter for examination of blood pressure without abnormal findings: Secondary | ICD-10-CM

## 2023-07-31 NOTE — Progress Notes (Signed)
 Blood Pressure Check Visit  Julia Potter is here for blood pressure recheck following spontaneous vaginal birth on 05/16/23.  She was seen for postpartum visit on 07/24/23 by Nidia Daring, NP.  Pt reports she stopped taking Nifedipine  two days ago as instructed by provider at her postpartum visit. BP today is 126/82. Patient denies any dizziness, blurred vision, headache, chest pain, shortness of breath, or peripheral edema.  Advised pt that her BP is normal, contact office staff if she experiences any of the symptoms mentioned above.  Pt verbalized understanding.     [place], RN

## 2023-09-11 ENCOUNTER — Ambulatory Visit: Admitting: Family Medicine

## 2023-11-06 ENCOUNTER — Ambulatory Visit (INDEPENDENT_AMBULATORY_CARE_PROVIDER_SITE_OTHER): Admitting: Orthopedic Surgery

## 2023-11-06 DIAGNOSIS — S83272A Complex tear of lateral meniscus, current injury, left knee, initial encounter: Secondary | ICD-10-CM

## 2023-11-06 DIAGNOSIS — S83512A Sprain of anterior cruciate ligament of left knee, initial encounter: Secondary | ICD-10-CM | POA: Diagnosis not present

## 2023-11-06 DIAGNOSIS — M25562 Pain in left knee: Secondary | ICD-10-CM | POA: Diagnosis not present

## 2023-11-07 ENCOUNTER — Encounter: Payer: Self-pay | Admitting: Orthopedic Surgery

## 2023-11-07 NOTE — Progress Notes (Signed)
 Office Visit Note   Patient: Julia Potter           Date of Birth: [birth year]           MRN: [id number] Visit Date: 11/06/2023 Requested by: Sebastian Beverley NOVAK, MD [address] [place],  [place] [address] PCP: Sebastian Beverley NOVAK, MD  Subjective: Chief Complaint  Patient presents with   Left Knee - Pain    HPI: Julia Potter is a 33 y.o. female who presents to the office reporting left knee pain.  Patient was supposed to have surgery for her ACL but she ended up having a child.  The child is now 82 months old.  Describes painful knee along with instability swelling and mechanical symptoms.  Lidocaine  patch is utilized.  Symptoms are getting worse.  Hard for her to do any quick movements.  She cannot run.  MRI scan was about a year and a half old.  She has had some symptomatic instability since that time.  MRI at that time demonstrated tear of the lateral meniscus.  ACL had some intact fibers.  Distinct ACL sequence not performed on that MRI scan..                ROS: All systems reviewed are negative as they relate to the chief complaint within the history of present illness.  Patient denies fevers or chills.  Assessment & Plan: Visit Diagnoses:  1. Acute pain of left knee   2. Rupture of anterior cruciate ligament of left knee, initial encounter   3. Complex tear of lateral meniscus of left knee as current injury, initial encounter     Plan: Impression is pain and instability in the left knee.  No effusion today and her instability is not quite as significant as it was in the past.  I think she may be having some meniscal pathology giving her symptoms.  We need repeat MRI scanning with sequences to focus on the ACL.  She is likely heading for at least arthroscopy and meniscal debridement on that lateral side but whether or not she needs reconstruction is hard to say at this time.  Her exam is exceedingly guarded even on the nonaffected right side.  Follow-Up Instructions:  No follow-ups on file.   Orders:  Orders Placed This Encounter  Procedures   MR Knee Left w/o contrast   No orders of the defined types were placed in this encounter.     Procedures: No procedures performed   Clinical Data: No additional findings.  Objective: Vital Signs: There were no vitals taken for this visit.  Physical Exam:  Constitutional: Patient appears well-developed HEENT:  Head: Normocephalic Eyes:EOM are normal Neck: Normal range of motion Cardiovascular: Normal rate Pulmonary/chest: Effort normal Neurologic: Patient is alert Skin: Skin is warm Psychiatric: Patient has normal mood and affect  Ortho Exam: Ortho exam demonstrates essentially full range of motion of the left knee.  She has a lot of guarding with any manipulation of the knee.  Collaterals do feel stable particularly the MCL.  ACL exam is difficult to definitively perform based on patient-specific factors.  Another reason why MRI scanning with ACL windows is indicated.  Specialty Comments:  No specialty comments available.  Imaging: No results found.   PMFS History: Patient Active Problem List   Diagnosis Date Noted   IUD (intrauterine device) in place 07/24/2023   History of severe pre-eclampsia 10/12/2019   History of delayed postpartum hemorrhage 10/10/2019   History of  preterm delivery due to severe preeclampsia 04/25/2019   Chronic hypertension affecting pregnancy 04/18/2019   Herpes    Hirsutism 09/14/2015   Psychiatric illness 11/01/2013   Past Medical History:  Diagnosis Date   Abnormal uterine bleeding (AUB) 01/20/2016   ALLERGIC RHINITIS    Anemia, postpartum 10/12/2019   Anxiety    no meds   Chlamydia    Chronic hypertension in pregnancy 09/29/2019   Chronic hypertension with superimposed severe preeclampsia 10/09/2019   Concern for right renal agnesis, pending confirmation @32wk  US  04/03/2023   Depression    doing ok, in therapy - no meds   Herpes    History of  multiple miscarriages    History of PCOS 04/25/2019   History of postpartum hemorrhage, currently pregnant    HSV infection    Preeclampsia 2010   resolved after pregnancy   Rh negative state in antepartum period 05/10/2017   Seasonal allergies     Family History  Problem Relation Age of Onset   Hypertension Mother    Asthma Brother    Asthma Daughter    Diabetes Maternal Grandmother    Cancer Neg Hx    Heart disease Neg Hx     Past Surgical History:  Procedure Laterality Date   DILATION AND EVACUATION N/A 05/23/2017   Procedure: DILATATION AND EVACUATION;  Surgeon: Izell Harari, MD;  Location: [facility];  Service: Gynecology;  Laterality: N/A;  needs US    INTRAUTERINE DEVICE INSERTION  09/2010   IUD REMOVAL     Social History   Occupational History   Not on file  Tobacco Use   Smoking status: Never   Smokeless tobacco: Never  Vaping Use   Vaping status: Never Used  Substance and Sexual Activity   Alcohol use: Not Currently    Comment: socially   Drug use: No   Sexual activity: Yes    Birth control/protection: None

## 2023-11-19 ENCOUNTER — Other Ambulatory Visit

## 2023-11-20 ENCOUNTER — Ambulatory Visit
Admission: RE | Admit: 2023-11-20 | Discharge: 2023-11-20 | Disposition: A | Source: Ambulatory Visit | Attending: Orthopedic Surgery | Admitting: Orthopedic Surgery

## 2023-11-20 DIAGNOSIS — S83512A Sprain of anterior cruciate ligament of left knee, initial encounter: Secondary | ICD-10-CM

## 2023-11-20 DIAGNOSIS — S83272A Complex tear of lateral meniscus, current injury, left knee, initial encounter: Secondary | ICD-10-CM

## 2023-11-20 DIAGNOSIS — M25562 Pain in left knee: Secondary | ICD-10-CM

## 2023-11-23 ENCOUNTER — Telehealth: Payer: Self-pay | Admitting: Orthopedic Surgery

## 2023-11-23 NOTE — Telephone Encounter (Signed)
 Patient called. She would like to speak with someone about her MRI results.

## 2023-11-24 ENCOUNTER — Telehealth: Payer: Self-pay | Admitting: Orthopedic Surgery

## 2023-11-24 ENCOUNTER — Encounter: Payer: Self-pay | Admitting: Orthopedic Surgery

## 2023-11-24 NOTE — Telephone Encounter (Signed)
 Pt called stating that they have received their MRI results on My Chart and would like to go over them . Pt call back number is [phone]. If no one answers you can leave a meassage.

## 2023-11-27 ENCOUNTER — Telehealth: Payer: Self-pay | Admitting: Orthopedic Surgery

## 2023-11-27 NOTE — Telephone Encounter (Signed)
 I called.  MRI scan shows ACL tear.  Consistent with her symptomatic instability on a relatively frequent basis almost daily.  Menisci looked okay.  Exam last clinic visit was difficult due to guarding.  Nonetheless she is having symptomatic instability and had that prior to the birth of her child.  Plan at that time and the plan at this time is knee arthroscopy with bone patellar tendon bone autograft ACL reconstruction.  The risk and benefits are discussed with the patient include not limited to infection or vessel damage incomplete pain relief as well as incomplete restoration of function.  The grueling and extensive nature of the rehabilitative process is discussed.  We talked about various time points where she would be able to do different things.  Her rehab could change depending on meniscal pathology but it did not look like there was definite meniscal pathology present on the current scan.  All questions answered.

## 2023-11-27 NOTE — Telephone Encounter (Signed)
 Message already sent to Dr Addie on 10/24

## 2023-11-27 NOTE — Telephone Encounter (Signed)
 Patient called. She would like to speak with someone about her MRI results.

## 2023-11-27 NOTE — Telephone Encounter (Signed)
 I called.

## 2023-11-27 NOTE — Telephone Encounter (Signed)
 No need I called patient scheduled for surgery

## 2023-11-28 NOTE — Telephone Encounter (Signed)
 Did some yesterday.  I think Marval has it.  Marval can you confirm?

## 2023-12-01 ENCOUNTER — Telehealth: Payer: Self-pay | Admitting: Orthopedic Surgery

## 2023-12-01 NOTE — Telephone Encounter (Signed)
 Received vm from patient asking about form process. IC,lmvm advised to bring forms to the office and to complete and sign authorization and pay the form fee.

## 2023-12-04 ENCOUNTER — Encounter: Payer: Self-pay | Admitting: Radiology

## 2023-12-13 NOTE — Telephone Encounter (Signed)
 Thx debbie

## 2023-12-25 ENCOUNTER — Encounter: Payer: Self-pay | Admitting: Orthopedic Surgery

## 2023-12-25 ENCOUNTER — Other Ambulatory Visit: Payer: Self-pay | Admitting: Surgical

## 2023-12-25 DIAGNOSIS — G8918 Other acute postprocedural pain: Secondary | ICD-10-CM | POA: Diagnosis not present

## 2023-12-25 DIAGNOSIS — M25362 Other instability, left knee: Secondary | ICD-10-CM | POA: Diagnosis not present

## 2023-12-25 DIAGNOSIS — M23342 Other meniscus derangements, anterior horn of lateral meniscus, left knee: Secondary | ICD-10-CM | POA: Diagnosis not present

## 2023-12-25 DIAGNOSIS — Y999 Unspecified external cause status: Secondary | ICD-10-CM | POA: Diagnosis not present

## 2023-12-25 DIAGNOSIS — X58XXXA Exposure to other specified factors, initial encounter: Secondary | ICD-10-CM | POA: Diagnosis not present

## 2023-12-25 DIAGNOSIS — M23612 Other spontaneous disruption of anterior cruciate ligament of left knee: Secondary | ICD-10-CM | POA: Diagnosis not present

## 2023-12-25 DIAGNOSIS — S83282A Other tear of lateral meniscus, current injury, left knee, initial encounter: Secondary | ICD-10-CM | POA: Diagnosis not present

## 2023-12-25 DIAGNOSIS — M94262 Chondromalacia, left knee: Secondary | ICD-10-CM | POA: Diagnosis not present

## 2023-12-25 DIAGNOSIS — S83512A Sprain of anterior cruciate ligament of left knee, initial encounter: Secondary | ICD-10-CM | POA: Diagnosis not present

## 2023-12-25 MED ORDER — KETOROLAC TROMETHAMINE 10 MG PO TABS: 10.0000 mg | ORAL_TABLET | Freq: Three times a day (TID) | ORAL | 0 refills | Status: AC | PRN

## 2023-12-25 MED ORDER — METHOCARBAMOL 500 MG PO TABS: 500.0000 mg | ORAL_TABLET | Freq: Three times a day (TID) | ORAL | 1 refills | Status: DC | PRN

## 2023-12-25 MED ORDER — OXYCODONE HCL 5 MG PO TABS: 5.0000 mg | ORAL_TABLET | ORAL | 0 refills | Status: DC | PRN

## 2023-12-25 MED ORDER — ASPIRIN 81 MG PO CHEW: 81.0000 mg | CHEWABLE_TABLET | Freq: Two times a day (BID) | ORAL | 0 refills | Status: AC

## 2024-01-03 ENCOUNTER — Ambulatory Visit: Admitting: Orthopedic Surgery

## 2024-01-03 DIAGNOSIS — S83512A Sprain of anterior cruciate ligament of left knee, initial encounter: Secondary | ICD-10-CM

## 2024-01-03 MED ORDER — OXYCODONE HCL 5 MG PO TABS: ORAL_TABLET | ORAL | 0 refills | Status: DC

## 2024-01-05 ENCOUNTER — Encounter: Payer: Self-pay | Admitting: Orthopedic Surgery

## 2024-01-05 NOTE — Progress Notes (Signed)
 I cannot tell you that  Post-Op Visit Note   Patient: Julia Potter           Date of Birth: 01-Oct-1990           MRN: [id number] Visit Date: 01/03/2024 PCP: Sebastian Beverley NOVAK, MD   Assessment & Plan:  Chief Complaint:  Chief Complaint  Patient presents with   Left Knee - Routine Post Op    9 days s/p ACL reconstruction   Visit Diagnoses:  1. Rupture of anterior cruciate ligament of left knee, initial encounter     Plan: Patient is 9 days postop left knee ACL reconstruction bone patella tendon bone autograft been partial weightbearing with crutches.  Oxycodone  and Tylenol  for pain.  Oxycodone  refilled.  CPM at 60 degrees.  Aspirin  for DVT prophylaxis.  Plan is out of work for 3 more weeks sutures removed.  2-week return with Herlene to check to make sure she is close to 90 of flexion.  PT at [employer]  farm for weightbearing as tolerated in the knee immobilizer until she can do 10 straight leg raises.  Please work on flexion and quad strengthening 2 times a week for 6 weeks plus a home exercise program.  Patient has full extension today and about 60 of flexion.  Mild effusion.  No calf tenderness negative Homans  Follow-Up Instructions: Return in about 2 weeks (around 01/17/2024).   Orders:  Orders Placed This Encounter  Procedures   Ambulatory referral to Physical Therapy   Meds ordered this encounter  Medications   oxyCODONE  (ROXICODONE ) 5 MG immediate release tablet    Sig: 1 po q 8-12hrs prn pain    Dispense:  30 tablet    Refill:  0    Imaging: No results found.  PMFS History: Patient Active Problem List   Diagnosis Date Noted   IUD (intrauterine device) in place 07/24/2023   History of severe pre-eclampsia 10/12/2019   History of delayed postpartum hemorrhage 10/10/2019   History of preterm delivery due to severe preeclampsia 04/25/2019   Chronic hypertension affecting pregnancy 04/18/2019   Herpes    Hirsutism 09/14/2015   Psychiatric illness 11/01/2013    Past Medical History:  Diagnosis Date   Abnormal uterine bleeding (AUB) 01/20/2016   ALLERGIC RHINITIS    Anemia, postpartum 10/12/2019   Anxiety    no meds   Chlamydia    Chronic hypertension in pregnancy 09/29/2019   Chronic hypertension with superimposed severe preeclampsia 10/09/2019   Concern for right renal agnesis, pending confirmation @32wk  US  04/03/2023   Depression    doing ok, in therapy - no meds   Herpes    History of multiple miscarriages    History of PCOS 04/25/2019   History of postpartum hemorrhage, currently pregnant    HSV infection    Preeclampsia 2010   resolved after pregnancy   Rh negative state in antepartum period 05/10/2017   Seasonal allergies     Family History  Problem Relation Age of Onset   Hypertension Mother    Asthma Brother    Asthma Daughter    Diabetes Maternal Grandmother    Cancer Neg Hx    Heart disease Neg Hx     Past Surgical History:  Procedure Laterality Date   DILATION AND EVACUATION N/A 05/23/2017   Procedure: DILATATION AND EVACUATION;  Surgeon: Izell Harari, MD;  Location: [facility];  Service: Gynecology;  Laterality: N/A;  needs US    INTRAUTERINE DEVICE INSERTION  09/2010   IUD REMOVAL  Social History   Occupational History   Not on file  Tobacco Use   Smoking status: Never   Smokeless tobacco: Never  Vaping Use   Vaping status: Never Used  Substance and Sexual Activity   Alcohol use: Not Currently    Comment: socially   Drug use: No   Sexual activity: Yes    Birth control/protection: None

## 2024-01-18 ENCOUNTER — Ambulatory Visit: Admitting: Surgical

## 2024-01-18 DIAGNOSIS — S83512A Sprain of anterior cruciate ligament of left knee, initial encounter: Secondary | ICD-10-CM

## 2024-01-21 ENCOUNTER — Encounter: Payer: Self-pay | Admitting: Surgical

## 2024-01-21 NOTE — Progress Notes (Signed)
 "  Post-Op Visit Note   Patient: Julia Potter           Date of Birth: [birth year]           MRN: [id number] Visit Date: 01/18/2024 PCP: Sebastian Beverley NOVAK, MD   Assessment & Plan:  Chief Complaint:  Chief Complaint  Patient presents with   Left Knee - Routine Post Op, Follow-up    12/25/2023 left knee ACL reconstruction with bone patella tendon bone autograft   Visit Diagnoses: No diagnosis found.  Plan: Patient is a 33 year old female who presents s/p left knee anterior cruciate ligament reconstruction bone patellar tendon bone autograft on 12/21/2023.  She is here today for range of motion check per Dr. Addie.  She states that she is having good days and bad days.  Up to 90 degrees on the CPM that she is using 3 times a day.  Taking oxycodone  for pain control.  Has continued with the knee immobilizer.  She states that her shin hurts at times and she is having hypersensitivity and numbness/tingling with neuropathic type pain in the saphenous nerve distribution.  We will plan to keep an eye on this but expect that this will calm down as she gets further out from surgery.  On exam, patient has incisions that are healing well without evidence of infection or dehiscence.  No significant effusion.  Able to perform straight leg raise without extensor lag.  She has 0 degrees extension and about 80 degrees of knee flexion.  No calf tenderness.  Negative Homans' sign.  Palp DP pulse.  ACL graft is stable on Lachman exam and by anterior drawer.  Plan at this time is continue with physical therapy that is scheduled for 01/31/2024.  We will see her back in 4 weeks and at that point the goal is that she is full weightbearing with 0 degrees extension and flexion easily past 90 degrees.  She is well on her way now and has done well with her rehab so far.  Call with any concerns in the meantime.  Follow-Up Instructions: No follow-ups on file.   Orders:  No orders of the defined types were placed in  this encounter.  No orders of the defined types were placed in this encounter.   Imaging: No results found.  PMFS History: Patient Active Problem List   Diagnosis Date Noted   IUD (intrauterine device) in place 07/24/2023   History of severe pre-eclampsia 10/12/2019   History of delayed postpartum hemorrhage 10/10/2019   History of preterm delivery due to severe preeclampsia 04/25/2019   Chronic hypertension affecting pregnancy 04/18/2019   Herpes    Hirsutism 09/14/2015   Psychiatric illness 11/01/2013   Past Medical History:  Diagnosis Date   Abnormal uterine bleeding (AUB) 01/20/2016   ALLERGIC RHINITIS    Anemia, postpartum 10/12/2019   Anxiety    no meds   Chlamydia    Chronic hypertension in pregnancy 09/29/2019   Chronic hypertension with superimposed severe preeclampsia 10/09/2019   Concern for right renal agnesis, pending confirmation @32wk  US  04/03/2023   Depression    doing ok, in therapy - no meds   Herpes    History of multiple miscarriages    History of PCOS 04/25/2019   History of postpartum hemorrhage, currently pregnant    HSV infection    Preeclampsia 2010   resolved after pregnancy   Rh negative state in antepartum period 05/10/2017   Seasonal allergies     Family History  Problem  Relation Age of Onset   Hypertension Mother    Asthma Brother    Asthma Daughter    Diabetes Maternal Grandmother    Cancer Neg Hx    Heart disease Neg Hx     Past Surgical History:  Procedure Laterality Date   DILATION AND EVACUATION N/A 05/23/2017   Procedure: DILATATION AND EVACUATION;  Surgeon: Izell Harari, MD;  Location: [facility];  Service: Gynecology;  Laterality: N/A;  needs US    INTRAUTERINE DEVICE INSERTION  09/2010   IUD REMOVAL     Social History   Occupational History   Not on file  Tobacco Use   Smoking status: Never   Smokeless tobacco: Never  Vaping Use   Vaping status: Never Used  Substance and Sexual Activity   Alcohol use: Not  Currently    Comment: socially   Drug use: No   Sexual activity: Yes    Birth control/protection: None     "

## 2024-01-31 ENCOUNTER — Ambulatory Visit

## 2024-01-31 DIAGNOSIS — M25562 Pain in left knee: Secondary | ICD-10-CM | POA: Diagnosis present

## 2024-01-31 DIAGNOSIS — M6281 Muscle weakness (generalized): Secondary | ICD-10-CM | POA: Insufficient documentation

## 2024-01-31 DIAGNOSIS — S83512A Sprain of anterior cruciate ligament of left knee, initial encounter: Secondary | ICD-10-CM | POA: Insufficient documentation

## 2024-01-31 DIAGNOSIS — R2689 Other abnormalities of gait and mobility: Secondary | ICD-10-CM | POA: Insufficient documentation

## 2024-01-31 DIAGNOSIS — M25662 Stiffness of left knee, not elsewhere classified: Secondary | ICD-10-CM | POA: Diagnosis present

## 2024-01-31 DIAGNOSIS — Z9889 Other specified postprocedural states: Secondary | ICD-10-CM | POA: Diagnosis present

## 2024-01-31 DIAGNOSIS — Z4789 Encounter for other orthopedic aftercare: Secondary | ICD-10-CM | POA: Insufficient documentation

## 2024-01-31 NOTE — Therapy (Signed)
 " OUTPATIENT PHYSICAL THERAPY LOWER EXTREMITY EVALUATION   Patient Name: Julia Potter MRN: [id number] DOB:17-Aug-1990, 33 y.o., female Today's Date: 01/31/2024  END OF SESSION:  PT End of Session - 01/31/24 0853     Visit Number 1    Date for Recertification  04/24/24    Authorization Type [place] Medicaid    PT Start Time 0850    PT Stop Time 0925    PT Time Calculation (min) 35 min    Activity Tolerance Patient tolerated treatment well    Behavior During Therapy [facility] for tasks assessed/performed          Past Medical History:  Diagnosis Date   Abnormal uterine bleeding (AUB) 01/20/2016   ALLERGIC RHINITIS    Anemia, postpartum 10/12/2019   Anxiety    no meds   Chlamydia    Chronic hypertension in pregnancy 09/29/2019   Chronic hypertension with superimposed severe preeclampsia 10/09/2019   Concern for right renal agnesis, pending confirmation @32wk  US  04/03/2023   Depression    doing ok, in therapy - no meds   Herpes    History of multiple miscarriages    History of PCOS 04/25/2019   History of postpartum hemorrhage, currently pregnant    HSV infection    Preeclampsia 2010   resolved after pregnancy   Rh negative state in antepartum period 05/10/2017   Seasonal allergies    Past Surgical History:  Procedure Laterality Date   DILATION AND EVACUATION N/A 05/23/2017   Procedure: DILATATION AND EVACUATION;  Surgeon: Izell Harari, MD;  Location: [facility];  Service: Gynecology;  Laterality: N/A;  needs US    INTRAUTERINE DEVICE INSERTION  09/2010   IUD REMOVAL     Patient Active Problem List   Diagnosis Date Noted   IUD (intrauterine device) in place 07/24/2023   History of severe pre-eclampsia 10/12/2019   History of delayed postpartum hemorrhage 10/10/2019   History of preterm delivery due to severe preeclampsia 04/25/2019   Chronic hypertension affecting pregnancy 04/18/2019   Herpes    Hirsutism 09/14/2015   Psychiatric illness 11/01/2013    PCP:  Sebastian Beverley NOVAK, MD  REFERRING PROVIDER: Addie Cordella Hamilton, MD  REFERRING DIAG: [phone] (ICD-10-CM) - Left ACL tear  THERAPY DIAG:  Surgical aftercare, musculoskeletal system  Acute pain of left knee  Decreased range of motion (ROM) of left knee  S/P ACL surgery  Rationale for Evaluation and Treatment: Rehabilitation  ONSET DATE: 12/25/23 (Procedure)  SUBJECTIVE:   SUBJECTIVE STATEMENT:  Pt reported the L knee has felt weak so she has continued brace wear of a brace purchased online and still uses crutches for ambulation. Pt confirmed Surgery was on 12/25/23. Pt states she has been doing initial exercises given by doctor.   PERTINENT HISTORY: See PMH Chart above  PAIN:  Are you having pain? Yes: NPRS scale: 5/10 Pain location: L Knee; L shin Pain description: Burning sensation and sensitivity in shin; dicomfort in knee  Aggravating factors: Prolonged sitting  Relieving factors: rest, Ice   PRECAUTIONS: None  RED FLAGS: None   WEIGHT BEARING RESTRICTIONS:   FALLS:  Has patient fallen in last 6 months? No  LIVING ENVIRONMENT: Lives with: lives with their family Lives in: House/apartment Stairs: No Has following equipment at home: Crutches  OCCUPATION: Work from home   PLOF: Independent  PATIENT GOALS: To be able to return to the gym and complete her normal regime.   NEXT MD VISIT: 4 weeks   OBJECTIVE:  Note: Objective measures were  completed at Evaluation unless otherwise noted.  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 This may be a chronic complete tear. The posterior cruciate ligament, medial collateral ligament, and lateral collateral ligament are intact. The menisci are unremarkable. No significant chondromalacia. No significant joint effusion.   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 Correlate for instability. The exam is otherwise unremarkable.   Electronically signed by: Reyes Frees MD 11/21/2023 12:09 PM EDT RP  PATIENT SURVEYS:   LEFS: Lower Extremity Functional Score: 14 / 80 = 17.5%  COGNITION: Overall cognitive status: Within functional limits for tasks assessed     SENSATION: WFL; high sensitivity at L shin   EDEMA:  Slight sweling at L knee joint    LOWER EXTREMITY ROM:  Active ROM Right eval Left eval  Hip flexion WNL WNL  Hip extension    Hip abduction    Hip adduction    Hip internal rotation    Hip external rotation    Knee flexion WNL 85d  Knee extension WNL WNL  Ankle dorsiflexion WNL WNL  Ankle plantarflexion    Ankle inversion    Ankle eversion     (Blank rows = not tested)  LOWER EXTREMITY MMT:  MMT Right eval Left eval  Hip flexion WNL   Hip extension    Hip abduction    Hip adduction    Hip internal rotation    Hip external rotation    Knee flexion WNL 2/10  Knee extension WNL 3/10  Ankle dorsiflexion WNL WNL  Ankle plantarflexion    Ankle inversion    Ankle eversion     (Blank rows = not tested)   FUNCTIONAL TESTS:  30 seconds chair stand test: 4 reps   Lower Extremity Functional Score: 14 / 80 = 17.5 %  GAIT: Distance walked: In Clinic  Assistive device utilized: Crutches Level of assistance: Modified independence Comments: Forward trunk lean; weight shift to the R, decreased step length , decreased L knee flexion in swing phase                                                                                                                          TREATMENT DATE:   01/31/24- eval    PATIENT EDUCATION:  Education details: HEP Person educated: Patient Education method: Medical Illustrator Education comprehension: verbalized understanding and returned demonstration  HOME EXERCISE PROGRAM:  Access Code: [email or web address] Date: 01/31/2024 Prepared by: Almetta Fam Exercises -  Seated Heel Slide  - 1 x daily - 7 x weekly - 2 sets - 10 reps - 3 hold - Supine Isometric Hamstring Set  - 1 x daily - 7 x weekly - 2 sets - 10 reps - 3 hold - Active Straight Leg Raise with Quad Set  - 1 x daily - 7 x weekly - 2  sets - 10 reps - Supine Quad Set  - 1 x daily - 7 x weekly - 2 sets - 10 reps - 3 hold - Side to Side Weight Shift with Counter Support  - 1 x daily - 7 x weekly - 2 sets - 10 reps   ASSESSMENT:  CLINICAL IMPRESSION:  Patient is a 33 year old female who presents s/p left knee anterior cruciate ligament reconstruction bone patellar tendon bone autograft on 12/25/2023. Pt arrives ~6 weeks post op to PT today in a knee brace and using crutches; able to WBAT. Pt exhibits 0d of extension and deficits are in knee flexion ROM. Pt will benefit from PT to increase L knee ROM and strength to increase quality of gait to ease in participating in ADLs. Plan to progress pt to no AD use.   OBJECTIVE IMPAIRMENTS: Abnormal gait, decreased endurance, decreased ROM, decreased strength, and pain.   ACTIVITY LIMITATIONS: carrying, lifting, bending, and squatting  PARTICIPATION LIMITATIONS: cleaning and laundry  REHAB POTENTIAL: Good  CLINICAL DECISION MAKING: Stable/uncomplicated  EVALUATION COMPLEXITY: Low   GOALS: Goals reviewed with patient? Yes  SHORT TERM GOALS: Target date: 03/13/24 Pt will be fully independent with initial HEP Baseline: Goal status: INITIAL   LONG TERM GOALS: Target date: 04/24/24  Pt will obtain active ROM WNL for knee flexion in LLE Baseline: 85d Goal status: INITIAL  2.  Pt will be able to ambulate 560ft or more with normalized gait pattern and no AD   Baseline: antalgic with B crutches  Goal status: INITIAL  3.  Pt will perform >12 STS in 30 second timeframe  Baseline: 4 reps  Goal status: INITIAL  4.  Pt will increase LEFS score by 20 points  Baseline: See above  Goal status: INITIAL  5.  Pt will return to regular gym activity without  pain in order to return to recreational activity Baseline:  Goal status: INITIAL  PLAN:  PT FREQUENCY: 2x/week  PT DURATION: 12 weeks  PLANNED INTERVENTIONS: 97110-Therapeutic exercises, 97530- Therapeutic activity, 97112- Neuromuscular re-education, 97535- Self Care, 02859- Manual therapy, [phone]- Gait training, and Patient/Family education  PLAN FOR NEXT SESSION: PROM and AAROM of LLE knee fouc on knee flexion, isometrics and muscle activation of LLE    Mona  Sajjad, PT 01/31/2024, 10:53 AM  "

## 2024-02-02 ENCOUNTER — Ambulatory Visit: Attending: Orthopedic Surgery

## 2024-02-02 ENCOUNTER — Telehealth: Payer: Self-pay | Admitting: Surgical

## 2024-02-02 DIAGNOSIS — R2689 Other abnormalities of gait and mobility: Secondary | ICD-10-CM | POA: Diagnosis present

## 2024-02-02 DIAGNOSIS — M25562 Pain in left knee: Secondary | ICD-10-CM | POA: Diagnosis present

## 2024-02-02 DIAGNOSIS — Z4789 Encounter for other orthopedic aftercare: Secondary | ICD-10-CM | POA: Insufficient documentation

## 2024-02-02 DIAGNOSIS — M25662 Stiffness of left knee, not elsewhere classified: Secondary | ICD-10-CM | POA: Insufficient documentation

## 2024-02-02 DIAGNOSIS — M6281 Muscle weakness (generalized): Secondary | ICD-10-CM | POA: Diagnosis present

## 2024-02-02 DIAGNOSIS — Z9889 Other specified postprocedural states: Secondary | ICD-10-CM | POA: Diagnosis present

## 2024-02-02 NOTE — Telephone Encounter (Signed)
 Pt called wanting to know what medication you are going to put her on. Call back number is [phone]

## 2024-02-02 NOTE — Therapy (Signed)
 " OUTPATIENT PHYSICAL THERAPY LOWER EXTREMITY TREATMENT   Patient Name: Julia Potter MRN: [id number] DOB:1991-01-10, 34 y.o., female Today's Date: 02/02/2024  END OF SESSION:  PT End of Session - 02/02/24 1045     Visit Number 2    Date for Recertification  04/24/24    Authorization Type [place] Medicaid    PT Start Time 1005    PT Stop Time 1047    PT Time Calculation (min) 42 min    Activity Tolerance Patient tolerated treatment well    Behavior During Therapy WFL for tasks assessed/performed         Past Medical History:  Diagnosis Date   Abnormal uterine bleeding (AUB) 01/20/2016   ALLERGIC RHINITIS    Anemia, postpartum 10/12/2019   Anxiety    no meds   Chlamydia    Chronic hypertension in pregnancy 09/29/2019   Chronic hypertension with superimposed severe preeclampsia 10/09/2019   Concern for right renal agnesis, pending confirmation @32wk  US  04/03/2023   Depression    doing ok, in therapy - no meds   Herpes    History of multiple miscarriages    History of PCOS 04/25/2019   History of postpartum hemorrhage, currently pregnant    HSV infection    Preeclampsia 2010   resolved after pregnancy   Rh negative state in antepartum period 05/10/2017   Seasonal allergies    Past Surgical History:  Procedure Laterality Date   DILATION AND EVACUATION N/A 05/23/2017   Procedure: DILATATION AND EVACUATION;  Surgeon: Izell Harari, MD;  Location: [facility];  Service: Gynecology;  Laterality: N/A;  needs US    INTRAUTERINE DEVICE INSERTION  09/2010   IUD REMOVAL     Patient Active Problem List   Diagnosis Date Noted   IUD (intrauterine device) in place 07/24/2023   History of severe pre-eclampsia 10/12/2019   History of delayed postpartum hemorrhage 10/10/2019   History of preterm delivery due to severe preeclampsia 04/25/2019   Chronic hypertension affecting pregnancy 04/18/2019   Herpes    Hirsutism 09/14/2015   Psychiatric illness 11/01/2013    PCP: Sebastian Beverley NOVAK, MD  REFERRING PROVIDER: Addie Cordella Hamilton, MD  REFERRING DIAG: [phone] (ICD-10-CM) - Left ACL tear  THERAPY DIAG:  S/P ACL surgery  Decreased range of motion (ROM) of left knee  Surgical aftercare, musculoskeletal system  Other abnormalities of gait and mobility  Acute pain of left knee  Muscle weakness (generalized)  Rationale for Evaluation and Treatment: Rehabilitation  ONSET DATE: 12/25/23 (Procedure)  SUBJECTIVE:   SUBJECTIVE STATEMENT:  4/5 pain in the L knee today with some stiffness in the knee joint. Pt reported she returned to work and that requires her to be seated for prolonged periods of time. Pt reported she has started some of the HEP that was given to her.    Pt reported the L knee has felt weak so she has continued brace wear of a brace purchased online and still uses crutches for ambulation. Pt confirmed Surgery was on 12/25/23. Pt states she has been doing initial exercises given by doctor.   PERTINENT HISTORY: See PMH Chart above  PAIN:  Are you having pain? Yes: NPRS scale: 5/10 Pain location: L Knee; L shin Pain description: Burning sensation and sensitivity in shin; dicomfort in knee  Aggravating factors: Prolonged sitting  Relieving factors: rest, Ice   PRECAUTIONS: None  RED FLAGS: None   WEIGHT BEARING RESTRICTIONS:   FALLS:  Has patient fallen in last 6 months? No  LIVING ENVIRONMENT: Lives with: lives with their family Lives in: House/apartment Stairs: No Has following equipment at home: Crutches  OCCUPATION: Work from home   PLOF: Independent  PATIENT GOALS: To be able to return to the gym and complete her normal regime.   NEXT MD VISIT: 4 weeks   OBJECTIVE:  Note: Objective measures were completed at Evaluation unless otherwise noted.  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 This may be a chronic complete tear.  The posterior cruciate ligament, medial collateral ligament, and lateral collateral ligament are intact. The menisci are unremarkable. No significant chondromalacia. No significant joint effusion.   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 Correlate for instability. The exam is otherwise unremarkable.   Electronically signed by: Reyes Frees MD 11/21/2023 12:09 PM EDT RP  PATIENT SURVEYS:   LEFS: Lower Extremity Functional Score: 14 / 80 = 17.5%  COGNITION: Overall cognitive status: Within functional limits for tasks assessed     SENSATION: WFL; high sensitivity at L shin   EDEMA:  Slight sweling at L knee joint    LOWER EXTREMITY ROM:  Active ROM Right eval Left eval  Hip flexion WNL WNL  Hip extension    Hip abduction    Hip adduction    Hip internal rotation    Hip external rotation    Knee flexion WNL 85d  Knee extension WNL WNL  Ankle dorsiflexion WNL WNL  Ankle plantarflexion    Ankle inversion    Ankle eversion     (Blank rows = not tested)  LOWER EXTREMITY MMT:  MMT Right eval Left eval  Hip flexion WNL   Hip extension    Hip abduction    Hip adduction    Hip internal rotation    Hip external rotation    Knee flexion WNL 2/10  Knee extension WNL 3/10  Ankle dorsiflexion WNL WNL  Ankle plantarflexion    Ankle inversion    Ankle eversion     (Blank rows = not tested)   FUNCTIONAL TESTS:  30 seconds chair stand test: 4 reps   Lower Extremity Functional Score: 14 / 80 = 17.5 %  GAIT: Distance walked: In Clinic  Assistive device utilized: Crutches Level of assistance: Modified independence Comments: Forward trunk lean; weight shift to the R, decreased step length , decreased L knee flexion in swing phase                                                                                                                          TREATMENT DATE:  02/02/24 PROM  knee flex; AROM knee ext NuStep L2 x 5 min  Ambulation w/o crutches 20' x2 HHA Prone HS curls 2x5 Supine HS isometrics 2x8 Toe Taps 4 step 1 railing use 2x10 1 crutch ambulation step to pattern   01/31/24- eval  PATIENT EDUCATION:  Education details: HEP Person educated: Patient Education method: Medical Illustrator Education comprehension: verbalized understanding and returned demonstration  HOME EXERCISE PROGRAM:  Access Code: [email or web address] Date: 01/31/2024 Prepared by: Almetta Fam Exercises - Seated Heel Slide  - 1 x daily - 7 x weekly - 2 sets - 10 reps - 3 hold - Supine Isometric Hamstring Set  - 1 x daily - 7 x weekly - 2 sets - 10 reps - 3 hold - Active Straight Leg Raise with Quad Set  - 1 x daily - 7 x weekly - 2 sets - 10 reps - Supine Quad Set  - 1 x daily - 7 x weekly - 2 sets - 10 reps - 3 hold - Side to Side Weight Shift with Counter Support  - 1 x daily - 7 x weekly - 2 sets - 10 reps   ASSESSMENT:  CLINICAL IMPRESSION:  Pt exhibits deficits in LLE knee flexion; pt rests with knee in slight extension in sitting. Pt with decreased knee flexion I swing phase of gait on the LLE and decreased step length during ambulation. Pt exhibits apprehension with weight bearing on LLE for activities like to tapping on a step and walking. Pt will benefit from continued PT to increase quality of gait and refine body mechanics. Pt required HHA today for ambulation activity without AD use and verbal cues for increases step length and knee flexion in swing phase. Plan to increase pre-gait and gait activity. Pt requires 30 minute for total travel time to and from PT and sessions will typically last ~40 to 45 min.   Patient is a 34 year old female who presents s/p left knee anterior cruciate ligament reconstruction bone patellar tendon bone autograft on 12/25/2023. Pt arrives ~6 weeks post op to PT today in a knee brace and using crutches; able to WBAT. Pt exhibits 0d of extension and  deficits are in knee flexion ROM. Pt will benefit from PT to increase L knee ROM and strength to increase quality of gait to ease in participating in ADLs. Plan to progress pt to no AD use.   OBJECTIVE IMPAIRMENTS: Abnormal gait, decreased endurance, decreased ROM, decreased strength, and pain.   ACTIVITY LIMITATIONS: carrying, lifting, bending, and squatting  PARTICIPATION LIMITATIONS: cleaning and laundry  REHAB POTENTIAL: Good  CLINICAL DECISION MAKING: Stable/uncomplicated  EVALUATION COMPLEXITY: Low   GOALS: Goals reviewed with patient? Yes  SHORT TERM GOALS: Target date: 03/13/24 Pt will be fully independent with initial HEP Baseline: Goal status: IN PROGRESS ~50% 02/02/24   LONG TERM GOALS: Target date: 04/24/24  Pt will obtain active ROM WNL for knee flexion in LLE Baseline: 85d Goal status: IN PROGRESS 85d AROM 02/02/24  2.  Pt will be able to ambulate 568ft or more with normalized gait pattern and no AD   Baseline: antalgic with B crutches  Goal status: IN PROGRESS 20' at a time with [facility] 02/02/24  3.  Pt will perform >12 STS in 30 second timeframe  Baseline: 4 reps  Goal status: INITIAL  4.  Pt will increase LEFS score by 20 points  Baseline: See above  Goal status: INITIAL  5.  Pt will return to regular gym activity without pain in order to return to recreational activity Baseline:  Goal status: IN PROGRESS 02/02/24  PLAN:  PT FREQUENCY: 2x/week  PT DURATION: 12 weeks  PLANNED INTERVENTIONS: 97110-Therapeutic exercises, 97530- Therapeutic activity, 97112- Neuromuscular re-education, 97535- Self Care, 02859- Manual therapy, [phone]- Gait training, and Patient/Family education  PLAN FOR NEXT SESSION: PROM and AAROM of LLE knee fouc on knee flexion, isometrics and muscle activation of LLE    Thersia Alder, Student-PT 02/02/2024, 10:50 AM  "

## 2024-02-05 NOTE — Telephone Encounter (Signed)
 Is she asking for pain medicine refill?  Am not really sure what this is in regards to

## 2024-02-06 ENCOUNTER — Ambulatory Visit: Admitting: Physical Therapy

## 2024-02-06 ENCOUNTER — Other Ambulatory Visit: Payer: Self-pay | Admitting: Surgical

## 2024-02-06 DIAGNOSIS — M25562 Pain in left knee: Secondary | ICD-10-CM

## 2024-02-06 DIAGNOSIS — Z9889 Other specified postprocedural states: Secondary | ICD-10-CM | POA: Diagnosis not present

## 2024-02-06 DIAGNOSIS — M6281 Muscle weakness (generalized): Secondary | ICD-10-CM

## 2024-02-06 DIAGNOSIS — M25662 Stiffness of left knee, not elsewhere classified: Secondary | ICD-10-CM

## 2024-02-06 MED ORDER — CYCLOBENZAPRINE HCL 5 MG PO TABS: 5.0000 mg | ORAL_TABLET | Freq: Three times a day (TID) | ORAL | 0 refills | Status: AC | PRN

## 2024-02-06 MED ORDER — OXYCODONE HCL 5 MG PO TABS: 5.0000 mg | ORAL_TABLET | Freq: Two times a day (BID) | ORAL | 0 refills | Status: AC | PRN

## 2024-02-06 NOTE — Therapy (Signed)
 " OUTPATIENT PHYSICAL THERAPY LOWER EXTREMITY TREATMENT   Patient Name: Julia Potter MRN: [id number] DOB:October 17, 1990, 34 y.o., female Today's Date: 02/06/2024  END OF SESSION:  PT End of Session - 02/06/24 1217     Visit Number 3    Date for Recertification  04/24/24    Authorization Type [place] Medicaid    PT Start Time 1218    PT Stop Time 1300    PT Time Calculation (min) 42 min         Past Medical History:  Diagnosis Date   Abnormal uterine bleeding (AUB) 01/20/2016   ALLERGIC RHINITIS    Anemia, postpartum 10/12/2019   Anxiety    no meds   Chlamydia    Chronic hypertension in pregnancy 09/29/2019   Chronic hypertension with superimposed severe preeclampsia 10/09/2019   Concern for right renal agnesis, pending confirmation @32wk  US  04/03/2023   Depression    doing ok, in therapy - no meds   Herpes    History of multiple miscarriages    History of PCOS 04/25/2019   History of postpartum hemorrhage, currently pregnant    HSV infection    Preeclampsia 2010   resolved after pregnancy   Rh negative state in antepartum period 05/10/2017   Seasonal allergies    Past Surgical History:  Procedure Laterality Date   DILATION AND EVACUATION N/A 05/23/2017   Procedure: DILATATION AND EVACUATION;  Surgeon: Izell Harari, MD;  Location: [facility];  Service: Gynecology;  Laterality: N/A;  needs US    INTRAUTERINE DEVICE INSERTION  09/2010   IUD REMOVAL     Patient Active Problem List   Diagnosis Date Noted   IUD (intrauterine device) in place 07/24/2023   History of severe pre-eclampsia 10/12/2019   History of delayed postpartum hemorrhage 10/10/2019   History of preterm delivery due to severe preeclampsia 04/25/2019   Chronic hypertension affecting pregnancy 04/18/2019   Herpes    Hirsutism 09/14/2015   Psychiatric illness 11/01/2013    PCP: Sebastian Beverley NOVAK, MD  REFERRING PROVIDER: Addie Cordella Hamilton, MD  REFERRING DIAG: [phone] (ICD-10-CM) - Left ACL  tear  THERAPY DIAG:  S/P ACL surgery  Decreased range of motion (ROM) of left knee  Muscle weakness (generalized)  Acute pain of left knee  Rationale for Evaluation and Treatment: Rehabilitation  ONSET DATE: 12/25/23 (Procedure)  SUBJECTIVE:   SUBJECTIVE STATEMENT: arrived with brace no AD.  4/5 pain in the L knee today with some stiffness in the knee joint. Pt reported she returned to work and that requires her to be seated for prolonged periods of time. Pt reported she has started some of the HEP that was given to her.    Pt reported the L knee has felt weak so she has continued brace wear of a brace purchased online and still uses crutches for ambulation. Pt confirmed Surgery was on 12/25/23. Pt states she has been doing initial exercises given by doctor.   PERTINENT HISTORY: See PMH Chart above  PAIN:  Are you having pain? 4/10 left knee PRECAUTIONS: None  RED FLAGS: None   WEIGHT BEARING RESTRICTIONS:   FALLS:  Has patient fallen in last 6 months? No  LIVING ENVIRONMENT: Lives with: lives with their family Lives in: House/apartment Stairs: No Has following equipment at home: Crutches  OCCUPATION: Work from home   PLOF: Independent  PATIENT GOALS: To be able to return to the gym and complete her normal regime.   NEXT MD VISIT: 4 weeks   OBJECTIVE:  Note:  Objective measures were completed at Evaluation unless otherwise noted.  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 This may be a chronic complete tear. The posterior cruciate ligament, medial collateral ligament, and lateral collateral ligament are intact. The menisci are unremarkable. No significant chondromalacia. No significant joint effusion.   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 Correlate for instability. The exam is  otherwise unremarkable.   Electronically signed by: Reyes Frees MD 11/21/2023 12:09 PM EDT RP  PATIENT SURVEYS:   LEFS: Lower Extremity Functional Score: 14 / 80 = 17.5%  COGNITION: Overall cognitive status: Within functional limits for tasks assessed     SENSATION: WFL; high sensitivity at L shin   EDEMA:  Slight sweling at L knee joint    LOWER EXTREMITY ROM:  Active ROM Right eval Left eval  Hip flexion WNL WNL  Hip extension    Hip abduction    Hip adduction    Hip internal rotation    Hip external rotation    Knee flexion WNL 85d  Knee extension WNL WNL  Ankle dorsiflexion WNL WNL  Ankle plantarflexion    Ankle inversion    Ankle eversion     (Blank rows = not tested)  LOWER EXTREMITY MMT:  MMT Right eval Left eval  Hip flexion WNL   Hip extension    Hip abduction    Hip adduction    Hip internal rotation    Hip external rotation    Knee flexion WNL 2/10  Knee extension WNL 3/10  Ankle dorsiflexion WNL WNL  Ankle plantarflexion    Ankle inversion    Ankle eversion     (Blank rows = not tested)   FUNCTIONAL TESTS:  30 seconds chair stand test: 4 reps   Lower Extremity Functional Score: 14 / 80 = 17.5 %  GAIT: Distance walked: In Clinic  Assistive device utilized: Crutches Level of assistance: Modified independence Comments: Forward trunk lean; weight shift to the R, decreased step length , decreased L knee flexion in swing phase                                                                                                                          TREATMENT DATE:   02/06/24 Worked on gait with HHA then without assist with cuing for left knee flexion in swing phase , step thru on RT and left heel strike- did well overall mostly struggle with heel strike Nustep L 5 LE only 6 in step tap left 10 x then RT 10 x with PTA support left knee 2 setsp Standing left HS curl 10 x Yellow tband seated HS curl 2 sets 10 TKE seated yellow tband 2  sets 10 Mini squats with resistance with TKE 2 sets 10 with cuing for equal wt STS 10x with cues for equal wt Step  up 4 inch 10 x- cued more wt through knee vs using UE Step up 6 in 10x AROM 0-96, Passive stretches plus ROM 106   02/02/24 PROM knee flex; AROM knee ext NuStep L2 x 5 min  Ambulation w/o crutches 20' x2 HHA Prone HS curls 2x5 Supine HS isometrics 2x8 Toe Taps 4 step 1 railing use 2x10 1 crutch ambulation step to pattern   01/31/24- eval    PATIENT EDUCATION:  Education details: HEP Person educated: Patient Education method: Medical Illustrator Education comprehension: verbalized understanding and returned demonstration  HOME EXERCISE PROGRAM:  Access Code: [email or web address] Date: 01/31/2024 Prepared by: Almetta Fam Exercises - Seated Heel Slide  - 1 x daily - 7 x weekly - 2 sets - 10 reps - 3 hold - Supine Isometric Hamstring Set  - 1 x daily - 7 x weekly - 2 sets - 10 reps - 3 hold - Active Straight Leg Raise with Quad Set  - 1 x daily - 7 x weekly - 2 sets - 10 reps - Supine Quad Set  - 1 x daily - 7 x weekly - 2 sets - 10 reps - 3 hold - Side to Side Weight Shift with Counter Support  - 1 x daily - 7 x weekly - 2 sets - 10 reps   ASSESSMENT:  CLINICAL IMPRESSION:  Pnt arrived amb without AD with brace. Focused on correct gait with knee flexion on left in swing phase, step through with RT and heel stike with left that was alittle harder for pnt. Progressed ex for ROM and strength with cuing as noted above.    Patient is a 34 year old female who presents s/p left knee anterior cruciate ligament reconstruction bone patellar tendon bone autograft on 12/25/2023. Pt arrives ~6 weeks post op to PT today in a knee brace and using crutches; able to WBAT. Pt exhibits 0d of extension and deficits are in knee flexion ROM. Pt will benefit from PT to increase L knee ROM and strength to increase quality of gait to ease in participating in ADLs. Plan to progress pt to no AD use.    OBJECTIVE IMPAIRMENTS: Abnormal gait, decreased endurance, decreased ROM, decreased strength, and pain.   ACTIVITY LIMITATIONS: carrying, lifting, bending, and squatting  PARTICIPATION LIMITATIONS: cleaning and laundry  REHAB POTENTIAL: Good  CLINICAL DECISION MAKING: Stable/uncomplicated  EVALUATION COMPLEXITY: Low   GOALS: Goals reviewed with patient? Yes  SHORT TERM GOALS: Target date: 03/13/24 Pt will be fully independent with initial HEP Baseline: Goal status: IN PROGRESS ~50% 02/02/24   LONG TERM GOALS: Target date: 04/24/24  Pt will obtain active ROM WNL for knee flexion in LLE Baseline: 85d Goal status: IN PROGRESS 85d AROM 02/02/24  2.  Pt will be able to ambulate 562ft or more with normalized gait pattern and no AD   Baseline: antalgic with B crutches  Goal status: IN PROGRESS 20' at a time with [facility] 02/02/24  3.  Pt will perform >12 STS in 30 second timeframe  Baseline: 4 reps  Goal status: INITIAL  4.  Pt will increase LEFS score by 20 points  Baseline: See above  Goal status: INITIAL  5.  Pt will return to regular gym activity without pain in order to return to recreational activity Baseline:  Goal status: IN PROGRESS 02/02/24  PLAN:  PT FREQUENCY: 2x/week  PT DURATION: 12 weeks  PLANNED INTERVENTIONS: 97110-Therapeutic exercises, 97530- Therapeutic activity, 97112- Neuromuscular re-education, 97535- Self Care, 02859- Manual therapy, [phone]- Gait training, and  Patient/Family education  PLAN FOR NEXT SESSION: PROM and AAROM of LLE knee fouc on knee flexion, isometrics and muscle activation of LLE    Laneah Luft,ANGIE, PTA 02/06/2024, 12:18 PM  "

## 2024-02-08 ENCOUNTER — Ambulatory Visit: Admitting: Physical Therapy

## 2024-02-08 DIAGNOSIS — M25662 Stiffness of left knee, not elsewhere classified: Secondary | ICD-10-CM

## 2024-02-08 DIAGNOSIS — M25562 Pain in left knee: Secondary | ICD-10-CM

## 2024-02-08 DIAGNOSIS — Z9889 Other specified postprocedural states: Secondary | ICD-10-CM

## 2024-02-08 DIAGNOSIS — M6281 Muscle weakness (generalized): Secondary | ICD-10-CM

## 2024-02-08 NOTE — Therapy (Signed)
 " OUTPATIENT PHYSICAL THERAPY LOWER EXTREMITY TREATMENT   Patient Name: Julia Potter MRN: [id number] DOB:[birth year], 34 y.o., female Today's Date: 02/08/2024  END OF SESSION:  PT End of Session - 02/08/24 0840     Visit Number 4    Date for Recertification  04/24/24    Authorization Type [place] [employer]    PT Start Time 0836    PT Stop Time 0925    PT Time Calculation (min) 49 min         Past Medical History:  Diagnosis Date   Abnormal uterine bleeding (AUB) 01/20/2016   ALLERGIC RHINITIS    Anemia, postpartum 10/12/2019   Anxiety    no meds   Chlamydia    Chronic hypertension in pregnancy 09/29/2019   Chronic hypertension with superimposed severe preeclampsia 10/09/2019   Concern for right renal agnesis, pending confirmation @32wk  US  04/03/2023   Depression    doing ok, in therapy - no meds   Herpes    History of multiple miscarriages    History of PCOS 04/25/2019   History of postpartum hemorrhage, currently pregnant    HSV infection    Preeclampsia 2010   resolved after pregnancy   Rh negative state in antepartum period 05/10/2017   Seasonal allergies    Past Surgical History:  Procedure Laterality Date   DILATION AND EVACUATION N/A 05/23/2017   Procedure: DILATATION AND EVACUATION;  Surgeon: Izell Harari, MD;  Location: [facility];  Service: Gynecology;  Laterality: N/A;  needs US    INTRAUTERINE DEVICE INSERTION  09/2010   IUD REMOVAL     Patient Active Problem List   Diagnosis Date Noted   IUD (intrauterine device) in place 07/24/2023   History of severe pre-eclampsia 10/12/2019   History of delayed postpartum hemorrhage 10/10/2019   History of preterm delivery due to severe preeclampsia 04/25/2019   Chronic hypertension affecting pregnancy 04/18/2019   Herpes    Hirsutism 09/14/2015   Psychiatric illness 11/01/2013    PCP: Sebastian Beverley NOVAK, MD  REFERRING PROVIDER: Addie Cordella Hamilton, MD  REFERRING DIAG: [phone] (ICD-10-CM) - Left ACL  tear  THERAPY DIAG:  S/P ACL surgery  Decreased range of motion (ROM) of left knee  Muscle weakness (generalized)  Acute pain of left knee  Rationale for Evaluation and Treatment: Rehabilitation  ONSET DATE: 12/25/23 (Procedure)  SUBJECTIVE:   SUBJECTIVE STATEMENT: arrived with brace no AD. Still limited flexion in swing phase with some circumduction  4/5 pain in the L knee today with some stiffness in the knee joint. Pt reported she returned to work and that requires her to be seated for prolonged periods of time. Pt reported she has started some of the HEP that was given to her.    Pt reported the L knee has felt weak so she has continued brace wear of a brace purchased online and still uses crutches for ambulation. Pt confirmed Surgery was on 12/25/23. Pt states she has been doing initial exercises given by doctor.   PERTINENT HISTORY: See PMH Chart above  PAIN:  Are you having pain? 4/10 left knee with gait increases with bend PRECAUTIONS: None  RED FLAGS: None   WEIGHT BEARING RESTRICTIONS:   FALLS:  Has patient fallen in last 6 months? No  LIVING ENVIRONMENT: Lives with: lives with their family Lives in: House/apartment Stairs: No Has following equipment at home: Crutches  OCCUPATION: Work from home   PLOF: Independent  PATIENT GOALS: To be able to return to the gym and complete her  normal regime.   NEXT MD VISIT: 4 weeks   OBJECTIVE:  Note: Objective measures were completed at Evaluation unless otherwise noted.  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 This may be a chronic complete tear. The posterior cruciate ligament, medial collateral ligament, and lateral collateral ligament are intact. The menisci are unremarkable. No significant chondromalacia. No significant joint effusion.   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 Correlate for instability. The exam is otherwise unremarkable.   Electronically signed by: Reyes Frees MD 11/21/2023 12:09 PM EDT RP  PATIENT SURVEYS:   LEFS: Lower Extremity Functional Score: 14 / 80 = 17.5%  COGNITION: Overall cognitive status: Within functional limits for tasks assessed     SENSATION: WFL; high sensitivity at L shin   EDEMA:  Slight sweling at L knee joint    LOWER EXTREMITY ROM:  Active ROM Right eval Left eval  Hip flexion WNL WNL  Hip extension    Hip abduction    Hip adduction    Hip internal rotation    Hip external rotation    Knee flexion WNL 85d  Knee extension WNL WNL  Ankle dorsiflexion WNL WNL  Ankle plantarflexion    Ankle inversion    Ankle eversion     (Blank rows = not tested)  LOWER EXTREMITY MMT:  MMT Right eval Left eval  Hip flexion WNL   Hip extension    Hip abduction    Hip adduction    Hip internal rotation    Hip external rotation    Knee flexion WNL 2/10  Knee extension WNL 3/10  Ankle dorsiflexion WNL WNL  Ankle plantarflexion    Ankle inversion    Ankle eversion     (Blank rows = not tested)   FUNCTIONAL TESTS:  30 seconds chair stand test: 4 reps   Lower Extremity Functional Score: 14 / 80 = 17.5 %  GAIT: Distance walked: In Clinic  Assistive device utilized: Crutches Level of assistance: Modified independence Comments: Forward trunk lean; weight shift to the R, decreased step length , decreased L knee flexion in swing phase                                                                                                                          TREATMENT DATE:   02/08/24 TM .8 to 1.3 mph working to increase stride and increase flexion in swing phase Nustep L 5 LE only Resisted gait 20# 5 x fwd and back, laterally 3 x each Black bar heel raise and toe raise 2 sets 10 STS on airex 2 sets 10 without UE 4 in step up LLE 10x with UE then 6  in LAQ 2 sets 10 hold 3 sec Red tband HS curls 2 sets 10 seated Leg Press 20# BIL 2 sets 10 with cuing  for equal wt on LE, LLE only unlocked for ROM 10 x AROM end of session 103 02/06/24 Worked on gait with HHA then without assist with cuing for left knee flexion in swing phase , step thru on RT and left heel strike- did well overall mostly struggle with heel strike Nustep L 5 LE only 6 in step tap left 10 x then RT 10 x with PTA support left knee 2 setsp Standing left HS curl 10 x Yellow tband seated HS curl 2 sets 10 TKE seated yellow tband 2 sets 10 Mini squats with resistance with TKE 2 sets 10 with cuing for equal wt STS 10x with cues for equal wt Step up 4 inch 10 x- cued more wt through knee vs using UE Step up 6 in 10x AROM 0-96, Passive stretches plus ROM 106   02/02/24 PROM knee flex; AROM knee ext NuStep L2 x 5 min  Ambulation w/o crutches 20' x2 HHA Prone HS curls 2x5 Supine HS isometrics 2x8 Toe Taps 4 step 1 railing use 2x10 1 crutch ambulation step to pattern   01/31/24- eval    PATIENT EDUCATION:  Education details: HEP Person educated: Patient Education method: Medical Illustrator Education comprehension: verbalized understanding and returned demonstration  HOME EXERCISE PROGRAM:  Access Code: [email or web address] Date: 01/31/2024 Prepared by: Almetta Fam Exercises - Seated Heel Slide  - 1 x daily - 7 x weekly - 2 sets - 10 reps - 3 hold - Supine Isometric Hamstring Set  - 1 x daily - 7 x weekly - 2 sets - 10 reps - 3 hold - Active Straight Leg Raise with Quad Set  - 1 x daily - 7 x weekly - 2 sets - 10 reps - Supine Quad Set  - 1 x daily - 7 x weekly - 2 sets - 10 reps - 3 hold - Side to Side Weight Shift with Counter Support  - 1 x daily - 7 x weekly - 2 sets - 10 reps   ASSESSMENT:  CLINICAL IMPRESSION: arrived with brace no AD. Still limited flexion in swing phase with some circumduction. Worked with TM to increase step length and flexion in swing  phase and did well. Resisted gait with some burning, CGA and cuing needed. Progressed ROM and strength with cuing   Patient is a 34 year old female who presents s/p left knee anterior cruciate ligament reconstruction bone patellar tendon bone autograft on 12/25/2023. Pt arrives ~6 weeks post op to PT today in a knee brace and using crutches; able to WBAT. Pt exhibits 0d of extension and deficits are in knee flexion ROM. Pt will benefit from PT to increase L knee ROM and strength to increase quality of gait to ease in participating in ADLs. Plan to progress pt to no AD use.   OBJECTIVE IMPAIRMENTS: Abnormal gait, decreased endurance, decreased ROM, decreased strength, and pain.   ACTIVITY LIMITATIONS: carrying, lifting, bending, and squatting  PARTICIPATION LIMITATIONS: cleaning and laundry  REHAB POTENTIAL: Good  CLINICAL DECISION MAKING: Stable/uncomplicated  EVALUATION COMPLEXITY: Low   GOALS: Goals reviewed with patient? Yes  SHORT TERM GOALS: Target date: 03/13/24 Pt will be fully independent with initial HEP Baseline: Goal status: IN PROGRESS ~50% 02/02/24   LONG TERM GOALS: Target date: 04/24/24  Pt will obtain active ROM WNL for knee flexion in LLE Baseline: 85d Goal status: IN PROGRESS 85d AROM 02/02/24  2.  Pt will be able to ambulate 52ft or more with normalized gait pattern and no AD  Baseline: antalgic with B crutches  Goal status: IN PROGRESS 20' at a time with [facility] 02/02/24  3.  Pt will perform >12 STS in 30 second timeframe  Baseline: 4 reps  Goal status: INITIAL  4.  Pt will increase LEFS score by 20 points  Baseline: See above  Goal status: INITIAL  5.  Pt will return to regular gym activity without pain in order to return to recreational activity Baseline:  Goal status: IN PROGRESS 02/02/24  PLAN:  PT FREQUENCY: 2x/week  PT DURATION: 12 weeks  PLANNED INTERVENTIONS: 97110-Therapeutic exercises, 97530- Therapeutic activity, 97112- Neuromuscular  re-education, 97535- Self Care, [id number]- Manual therapy, [phone]- Gait training, and Patient/Family education  PLAN FOR NEXT SESSION: PROM and AAROM of LLE knee fouc on knee flexion, isometrics and muscle activation of LLE    Celisse Ciulla,ANGIE, PTA 02/08/2024, 8:41 AM  "

## 2024-02-14 ENCOUNTER — Encounter: Payer: Self-pay | Admitting: Physical Therapy

## 2024-02-14 ENCOUNTER — Ambulatory Visit: Admitting: Physical Therapy

## 2024-02-14 DIAGNOSIS — M25662 Stiffness of left knee, not elsewhere classified: Secondary | ICD-10-CM

## 2024-02-14 DIAGNOSIS — Z9889 Other specified postprocedural states: Secondary | ICD-10-CM | POA: Diagnosis not present

## 2024-02-14 DIAGNOSIS — M6281 Muscle weakness (generalized): Secondary | ICD-10-CM

## 2024-02-14 NOTE — Therapy (Signed)
 " OUTPATIENT PHYSICAL THERAPY LOWER EXTREMITY TREATMENT   Patient Name: Julia Potter MRN: [id number] DOB:[birth year], 34 y.o., female Today's Date: 02/14/2024  END OF SESSION:  PT End of Session - 02/14/24 1017     Visit Number 5    Date for Recertification  04/24/24    PT Start Time 1016    PT Stop Time 1100    PT Time Calculation (min) 44 min    Activity Tolerance Patient tolerated treatment well    Behavior During Therapy [facility] for tasks assessed/performed         Past Medical History:  Diagnosis Date   Abnormal uterine bleeding (AUB) 01/20/2016   ALLERGIC RHINITIS    Anemia, postpartum 10/12/2019   Anxiety    no meds   Chlamydia    Chronic hypertension in pregnancy 09/29/2019   Chronic hypertension with superimposed severe preeclampsia 10/09/2019   Concern for right renal agnesis, pending confirmation @32wk  US  04/03/2023   Depression    doing ok, in therapy - no meds   Herpes    History of multiple miscarriages    History of PCOS 04/25/2019   History of postpartum hemorrhage, currently pregnant    HSV infection    Preeclampsia 2010   resolved after pregnancy   Rh negative state in antepartum period 05/10/2017   Seasonal allergies    Past Surgical History:  Procedure Laterality Date   DILATION AND EVACUATION N/A 05/23/2017   Procedure: DILATATION AND EVACUATION;  Surgeon: Izell Harari, MD;  Location: [facility];  Service: Gynecology;  Laterality: N/A;  needs US    INTRAUTERINE DEVICE INSERTION  09/2010   IUD REMOVAL     Patient Active Problem List   Diagnosis Date Noted   IUD (intrauterine device) in place 07/24/2023   History of severe pre-eclampsia 10/12/2019   History of delayed postpartum hemorrhage 10/10/2019   History of preterm delivery due to severe preeclampsia 04/25/2019   Chronic hypertension affecting pregnancy 04/18/2019   Herpes    Hirsutism 09/14/2015   Psychiatric illness 11/01/2013    PCP: Sebastian Beverley NOVAK, MD  REFERRING PROVIDER:  Addie Cordella Hamilton, MD  REFERRING DIAG: [phone] (ICD-10-CM) - Left ACL tear  THERAPY DIAG:  S/P ACL surgery  Decreased range of motion (ROM) of left knee  Muscle weakness (generalized)  Rationale for Evaluation and Treatment: Rehabilitation  ONSET DATE: 12/25/23 (Procedure)  SUBJECTIVE:   SUBJECTIVE STATEMENT: arrived with no brace doing well  4/5 pain in the L knee today with some stiffness in the knee joint. Pt reported she returned to work and that requires her to be seated for prolonged periods of time. Pt reported she has started some of the HEP that was given to her.    Pt reported the L knee has felt weak so she has continued brace wear of a brace purchased online and still uses crutches for ambulation. Pt confirmed Surgery was on 12/25/23. Pt states she has been doing initial exercises given by doctor.   PERTINENT HISTORY: See PMH Chart above  PAIN:  Are you having pain? 4/10 left knee with gait increases with bend PRECAUTIONS: None  RED FLAGS: None   WEIGHT BEARING RESTRICTIONS:   FALLS:  Has patient fallen in last 6 months? No  LIVING ENVIRONMENT: Lives with: lives with their family Lives in: House/apartment Stairs: No Has following equipment at home: Crutches  OCCUPATION: Work from home   PLOF: Independent  PATIENT GOALS: To be able to return to the gym and complete her normal regime.  NEXT MD VISIT: 4 weeks   OBJECTIVE:  Note: Objective measures were completed at Evaluation unless otherwise noted.  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 This may be a chronic complete tear. The posterior cruciate ligament, medial collateral ligament, and lateral collateral ligament are intact. The menisci are unremarkable. No significant chondromalacia. No significant joint effusion.   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 Correlate for instability. The exam is otherwise unremarkable.   Electronically signed by: Reyes Frees MD 11/21/2023 12:09 PM EDT RP  PATIENT SURVEYS:   LEFS: Lower Extremity Functional Score: 14 / 80 = 17.5%  COGNITION: Overall cognitive status: Within functional limits for tasks assessed     SENSATION: WFL; high sensitivity at L shin   EDEMA:  Slight sweling at L knee joint    LOWER EXTREMITY ROM:  Active ROM Right eval Left eval  Hip flexion WNL WNL  Hip extension    Hip abduction    Hip adduction    Hip internal rotation    Hip external rotation    Knee flexion WNL 85d  Knee extension WNL WNL  Ankle dorsiflexion WNL WNL  Ankle plantarflexion    Ankle inversion    Ankle eversion     (Blank rows = not tested)  LOWER EXTREMITY MMT:  MMT Right eval Left eval  Hip flexion WNL   Hip extension    Hip abduction    Hip adduction    Hip internal rotation    Hip external rotation    Knee flexion WNL 2/10  Knee extension WNL 3/10  Ankle dorsiflexion WNL WNL  Ankle plantarflexion    Ankle inversion    Ankle eversion     (Blank rows = not tested)   FUNCTIONAL TESTS:  30 seconds chair stand test: 4 reps   Lower Extremity Functional Score: 14 / 80 = 17.5 %  GAIT: Distance walked: In Clinic  Assistive device utilized: Crutches Level of assistance: Modified independence Comments: Forward trunk lean; weight shift to the R, decreased step length , decreased L knee flexion in swing phase                                                                                                                          TREATMENT DATE:  02/14/24 L knee PROM w/ end range holds  Patellar mobs RLE Multi angle isometric for Ext LLE 5x3'' Sit to stands 2x10  Bike L 3 x 4 min 4in step ups x5 each  6in step ups x5 super set with Tmill pushes x30 for three sets Mini wall squats 4x10'' LLE SLS 5x10 Leg press 40lb 2x15, LLE 20lb TKE  2x10  02/08/24 TM .8 to 1.3 mph working to increase stride and increase flexion in swing phase Nustep L 5 LE only Resisted gait 20# 5 x  fwd and back, laterally 3 x each Black bar heel raise and toe raise 2 sets 10 STS on airex 2 sets 10 without UE 4 in step up LLE 10x with UE then 6 in LAQ 2 sets 10 hold 3 sec Red tband HS curls 2 sets 10 seated Leg Press 20# BIL 2 sets 10 with cuing for equal wt on LE, LLE only unlocked for ROM 10 x AROM end of session 103 02/06/24 Worked on gait with HHA then without assist with cuing for left knee flexion in swing phase , step thru on RT and left heel strike- did well overall mostly struggle with heel strike Nustep L 5 LE only 6 in step tap left 10 x then RT 10 x with PTA support left knee 2 setsp Standing left HS curl 10 x Yellow tband seated HS curl 2 sets 10 TKE seated yellow tband 2 sets 10 Mini squats with resistance with TKE 2 sets 10 with cuing for equal wt STS 10x with cues for equal wt Step up 4 inch 10 x- cued more wt through knee vs using UE Step up 6 in 10x AROM 0-96, Passive stretches plus ROM 106   02/02/24 PROM knee flex; AROM knee ext NuStep L2 x 5 min  Ambulation w/o crutches 20' x2 HHA Prone HS curls 2x5 Supine HS isometrics 2x8 Toe Taps 4 step 1 railing use 2x10 1 crutch ambulation step to pattern   01/31/24- eval    PATIENT EDUCATION:  Education details: HEP Person educated: Patient Education method: Medical Illustrator Education comprehension: verbalized understanding and returned demonstration  HOME EXERCISE PROGRAM:  Access Code: [email or web address] Date: 01/31/2024 Prepared by: Almetta Fam Exercises - Seated Heel Slide  - 1 x daily - 7 x weekly - 2 sets - 10 reps - 3 hold - Supine Isometric Hamstring Set  - 1 x daily - 7 x weekly - 2 sets - 10 reps - 3 hold - Active Straight Leg Raise with Quad Set  - 1 x daily - 7 x weekly - 2 sets - 10 reps - Supine Quad Set  - 1 x daily - 7 x weekly - 2 sets - 10  reps - 3 hold - Side to Side Weight Shift with Counter Support  - 1 x daily - 7 x weekly - 2 sets - 10 reps   ASSESSMENT:  CLINICAL IMPRESSION: arrived no brace and no AD. Still limited flexion in swing phase cues to prevent  circumduction with step ups. Mor functional approach today emphasizing L hip an knee flexion and drive. Burning reported with SLS CGA and cuing needed.    Patient is a 34 year old female who presents s/p left knee anterior cruciate ligament reconstruction bone patellar tendon bone autograft on 12/25/2023. Pt arrives ~6 weeks post op to PT today in a knee brace and using crutches; able to WBAT. Pt exhibits 0d of extension and deficits are in knee flexion ROM. Pt will benefit from PT to increase L knee ROM and strength to increase quality of gait to ease in participating in ADLs. Plan to progress pt to no AD use.   OBJECTIVE IMPAIRMENTS: Abnormal gait, decreased endurance, decreased ROM, decreased strength, and pain.   ACTIVITY LIMITATIONS: carrying, lifting, bending, and squatting  PARTICIPATION LIMITATIONS: cleaning and laundry  REHAB POTENTIAL: Good  CLINICAL DECISION MAKING: Stable/uncomplicated  EVALUATION COMPLEXITY: Low   GOALS: Goals reviewed with patient? Yes  SHORT TERM GOALS: Target date: 03/13/24 Pt will be fully independent with  initial HEP Baseline: Goal status: IN PROGRESS ~50% 02/02/24   LONG TERM GOALS: Target date: 04/24/24  Pt will obtain active ROM WNL for knee flexion in LLE Baseline: 85d Goal status: IN PROGRESS 85d AROM 02/02/24  2.  Pt will be able to ambulate 52ft or more with normalized gait pattern and no AD   Baseline: antalgic with B crutches  Goal status: IN PROGRESS 20' at a time with [facility] 02/02/24  3.  Pt will perform >12 STS in 30 second timeframe  Baseline: 4 reps  Goal status: INITIAL  4.  Pt will increase LEFS score by 20 points  Baseline: See above  Goal status: INITIAL  5.  Pt will return to regular gym activity  without pain in order to return to recreational activity Baseline:  Goal status: IN PROGRESS 02/02/24  PLAN:  PT FREQUENCY: 2x/week  PT DURATION: 12 weeks  PLANNED INTERVENTIONS: 97110-Therapeutic exercises, 97530- Therapeutic activity, 97112- Neuromuscular re-education, 97535- Self Care, 02859- Manual therapy, [phone]- Gait training, and Patient/Family education  PLAN FOR NEXT SESSION: PROM and AAROM of LLE knee fouc on knee flexion, isometrics and muscle activation of LLE    Tanda KANDICE Sorrow, PTA 02/14/2024, 10:18 AM  "

## 2024-02-16 ENCOUNTER — Ambulatory Visit: Admitting: Physical Therapy

## 2024-02-16 DIAGNOSIS — Z9889 Other specified postprocedural states: Secondary | ICD-10-CM

## 2024-02-16 DIAGNOSIS — M6281 Muscle weakness (generalized): Secondary | ICD-10-CM

## 2024-02-16 DIAGNOSIS — M25662 Stiffness of left knee, not elsewhere classified: Secondary | ICD-10-CM

## 2024-02-16 DIAGNOSIS — M25562 Pain in left knee: Secondary | ICD-10-CM

## 2024-02-16 NOTE — Therapy (Signed)
 " OUTPATIENT PHYSICAL THERAPY LOWER EXTREMITY TREATMENT   Patient Name: Julia Potter MRN: [id number] DOB:21-Jul-1990, 34 y.o., female Today's Date: 02/16/2024  END OF SESSION:  PT End of Session - 02/16/24 0924     Visit Number 6    Date for Recertification  04/24/24    Authorization Type [place] Medicaid    PT Start Time [phone]    PT Stop Time 1015    PT Time Calculation (min) 50 min         Past Medical History:  Diagnosis Date   Abnormal uterine bleeding (AUB) 01/20/2016   ALLERGIC RHINITIS    Anemia, postpartum 10/12/2019   Anxiety    no meds   Chlamydia    Chronic hypertension in pregnancy 09/29/2019   Chronic hypertension with superimposed severe preeclampsia 10/09/2019   Concern for right renal agnesis, pending confirmation @32wk  US  04/03/2023   Depression    doing ok, in therapy - no meds   Herpes    History of multiple miscarriages    History of PCOS 04/25/2019   History of postpartum hemorrhage, currently pregnant    HSV infection    Preeclampsia 2010   resolved after pregnancy   Rh negative state in antepartum period 05/10/2017   Seasonal allergies    Past Surgical History:  Procedure Laterality Date   DILATION AND EVACUATION N/A 05/23/2017   Procedure: DILATATION AND EVACUATION;  Surgeon: Izell Harari, MD;  Location: [facility];  Service: Gynecology;  Laterality: N/A;  needs US    INTRAUTERINE DEVICE INSERTION  09/2010   IUD REMOVAL     Patient Active Problem List   Diagnosis Date Noted   IUD (intrauterine device) in place 07/24/2023   History of severe pre-eclampsia 10/12/2019   History of delayed postpartum hemorrhage 10/10/2019   History of preterm delivery due to severe preeclampsia 04/25/2019   Chronic hypertension affecting pregnancy 04/18/2019   Herpes    Hirsutism 09/14/2015   Psychiatric illness 11/01/2013    PCP: Sebastian Beverley NOVAK, MD  REFERRING PROVIDER: Addie Cordella Hamilton, MD  REFERRING DIAG: [phone] (ICD-10-CM) - Left ACL  tear  THERAPY DIAG:  S/P ACL surgery  Decreased range of motion (ROM) of left knee  Muscle weakness (generalized)  Acute pain of left knee  Rationale for Evaluation and Treatment: Rehabilitation  ONSET DATE: 12/25/23 (Procedure)  SUBJECTIVE:   SUBJECTIVE STATEMENT: still but okay. Amb in without AD-walking better and feels like she is trusting it more. Took meds this morning   Pt reported the L knee has felt weak so she has continued brace wear of a brace purchased online and still uses crutches for ambulation. Pt confirmed Surgery was on 12/25/23. Pt states she has been doing initial exercises given by doctor.   PERTINENT HISTORY: See PMH Chart above  PAIN:  Are you having pain 2/10 with pain meds PRECAUTIONS: None  RED FLAGS: None   WEIGHT BEARING RESTRICTIONS:   FALLS:  Has patient fallen in last 6 months? No  LIVING ENVIRONMENT: Lives with: lives with their family Lives in: House/apartment Stairs: No Has following equipment at home: Crutches  OCCUPATION: Work from home   PLOF: Independent  PATIENT GOALS: To be able to return to the gym and complete her normal regime.   NEXT MD VISIT: 4 weeks   OBJECTIVE:  Note: Objective measures were completed at Evaluation unless otherwise noted.  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 This may be  a chronic complete tear. The posterior cruciate ligament, medial collateral ligament, and lateral collateral ligament are intact. The menisci are unremarkable. No significant chondromalacia. No significant joint effusion.   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 Correlate for instability. The exam is otherwise unremarkable.   Electronically signed by: Reyes Frees MD 11/21/2023 12:09 PM EDT RP  PATIENT SURVEYS:   LEFS: Lower Extremity  Functional Score: 14 / 80 = 17.5%  COGNITION: Overall cognitive status: Within functional limits for tasks assessed     SENSATION: WFL; high sensitivity at L shin   EDEMA:  Slight sweling at L knee joint    LOWER EXTREMITY ROM:  Active ROM Right eval Left eval  Hip flexion WNL WNL  Hip extension    Hip abduction    Hip adduction    Hip internal rotation    Hip external rotation    Knee flexion WNL 85d  Knee extension WNL WNL  Ankle dorsiflexion WNL WNL  Ankle plantarflexion    Ankle inversion    Ankle eversion     (Blank rows = not tested)  LOWER EXTREMITY MMT:  MMT Right eval Left eval  Hip flexion WNL   Hip extension    Hip abduction    Hip adduction    Hip internal rotation    Hip external rotation    Knee flexion WNL 2/10  Knee extension WNL 3/10  Ankle dorsiflexion WNL WNL  Ankle plantarflexion    Ankle inversion    Ankle eversion     (Blank rows = not tested)   FUNCTIONAL TESTS:  30 seconds chair stand test: 4 reps   Lower Extremity Functional Score: 14 / 80 = 17.5 %  GAIT: Distance walked: In Clinic  Assistive device utilized: Crutches Level of assistance: Modified independence Comments: Forward trunk lean; weight shift to the R, decreased step length , decreased L knee flexion in swing phase                                                                                                                          TREATMENT DATE:   02/16/24 Left Knee AROM at Start of Session 0-111 MMT Left knee HS 4+, quad 4/5 Assessed Goals Elliptical 3 min each fwd and back L 3 TM OFF LLE 20 x fwd and back Resisted gait 40# 4 ways 5 x each 6 in step up 15 x with UE support- cued to lift not hop 6 inch lateral step up with UE support 4 inch heel tap 10x with UE support, then increased to 6 in for 10 with hesitantion from pt Leg press 40lb 2x15, LLE 20lb TKE 2x10 with green  tband Fitter flex and ext LLE 2 sets 10 1 blue Bike L 4   02/14/24 L knee  PROM w/ end range holds  Patellar mobs RLE Multi angle isometric for Ext LLE 5x3'' Sit to stands  2x10  Bike L 3 x 4 min 4in step ups x5 each  6in step ups x5 super set with Tmill pushes x30 for three sets Mini wall squats 4x10'' LLE SLS 5x10 Leg press 40lb 2x15, LLE 20lb TKE 2x10  02/08/24 TM .8 to 1.3 mph working to increase stride and increase flexion in swing phase Nustep L 5 LE only Resisted gait 20# 5 x fwd and back, laterally 3 x each Black bar heel raise and toe raise 2 sets 10 STS on airex 2 sets 10 without UE 4 in step up LLE 10x with UE then 6 in LAQ 2 sets 10 hold 3 sec Red tband HS curls 2 sets 10 seated Leg Press 20# BIL 2 sets 10 with cuing for equal wt on LE, LLE only unlocked for ROM 10 x AROM end of session 103 02/06/24 Worked on gait with HHA then without assist with cuing for left knee flexion in swing phase , step thru on RT and left heel strike- did well overall mostly struggle with heel strike Nustep L 5 LE only 6 in step tap left 10 x then RT 10 x with PTA support left knee 2 setsp Standing left HS curl 10 x Yellow tband seated HS curl 2 sets 10 TKE seated yellow tband 2 sets 10 Mini squats with resistance with TKE 2 sets 10 with cuing for equal wt STS 10x with cues for equal wt Step up 4 inch 10 x- cued more wt through knee vs using UE Step up 6 in 10x AROM 0-96, Passive stretches plus ROM 106   02/02/24 PROM knee flex; AROM knee ext NuStep L2 x 5 min  Ambulation w/o crutches 20' x2 HHA Prone HS curls 2x5 Supine HS isometrics 2x8 Toe Taps 4 step 1 railing use 2x10 1 crutch ambulation step to pattern   01/31/24- eval    PATIENT EDUCATION:  Education details: HEP Person educated: Patient Education method: Medical Illustrator Education comprehension: verbalized understanding and returned demonstration  HOME EXERCISE PROGRAM:  Access Code: [email or web address] Date: 01/31/2024 Prepared by: Almetta Fam Exercises - Seated Heel Slide  -  1 x daily - 7 x weekly - 2 sets - 10 reps - 3 hold - Supine Isometric Hamstring Set  - 1 x daily - 7 x weekly - 2 sets - 10 reps - 3 hold - Active Straight Leg Raise with Quad Set  - 1 x daily - 7 x weekly - 2 sets - 10 reps - Supine Quad Set  - 1 x daily - 7 x weekly - 2 sets - 10 reps - 3 hold - Side to Side Weight Shift with Counter Support  - 1 x daily - 7 x weekly - 2 sets - 10 reps   ASSESSMENT:  CLINICAL IMPRESSION: Assessed goals, ROM and MMT and she is progressing nicely. Minimal pain this morning but took pain meds. Amb better but still some compensations, does better if walks faster. Progressed strength with cuing needed. Struggled with step ups and step down and needed the most cuing.   Patient is a 34 year old female who presents s/p left knee anterior cruciate ligament reconstruction bone patellar tendon bone autograft on 12/25/2023. Pt arrives ~6 weeks post op to PT today in a knee brace and using crutches; able to WBAT. Pt exhibits 0d of extension and deficits are in knee flexion ROM. Pt will benefit from PT to increase L knee ROM and strength to increase quality of gait to  ease in participating in ADLs. Plan to progress pt to no AD use.   OBJECTIVE IMPAIRMENTS: Abnormal gait, decreased endurance, decreased ROM, decreased strength, and pain.   ACTIVITY LIMITATIONS: carrying, lifting, bending, and squatting  PARTICIPATION LIMITATIONS: cleaning and laundry  REHAB POTENTIAL: Good  CLINICAL DECISION MAKING: Stable/uncomplicated  EVALUATION COMPLEXITY: Low   GOALS: Goals reviewed with patient? Yes  SHORT TERM GOALS: Target date: 03/13/24 Pt will be fully independent with initial HEP Baseline: Goal status: IN PROGRESS ~50% 02/02/24 MET 02/16/24   LONG TERM GOALS: Target date: 04/24/24  Pt will obtain active ROM WNL for knee flexion in LLE Baseline: 85d Goal status: IN PROGRESS 85d AROM 02/02/24 02/13/24 progressing 0-111  2.  Pt will be able to ambulate 563ft or more with  normalized gait pattern and no AD   Baseline: antalgic with B crutches  Goal status: IN PROGRESS 20' at a time with [facility] 02/02/24 02/13/24 progressing  3.  Pt will perform >12 STS in 30 second timeframe  Baseline: 4 reps  Goal status: MET 02/16/24  4.  Pt will increase LEFS score by 20 points  Baseline: See above  Goal status: INITIAL  5.  Pt will return to regular gym activity without pain in order to return to recreational activity Baseline:  Goal status: IN PROGRESS 02/02/24 and 02/16/24  PLAN:  PT FREQUENCY: 2x/week  PT DURATION: 12 weeks  PLANNED INTERVENTIONS: 97110-Therapeutic exercises, 97530- Therapeutic activity, 97112- Neuromuscular re-education, 97535- Self Care, [id number]- Manual therapy, [phone]- Gait training, and Patient/Family education  PLAN FOR NEXT SESSION: progress strength ,func and gait  Chosen Geske,ANGIE, PTA 02/16/2024, 9:25 AM  "

## 2024-02-19 ENCOUNTER — Encounter: Payer: Self-pay | Admitting: Surgical

## 2024-02-19 ENCOUNTER — Ambulatory Visit: Admitting: Surgical

## 2024-02-19 DIAGNOSIS — Z9889 Other specified postprocedural states: Secondary | ICD-10-CM

## 2024-02-19 NOTE — Progress Notes (Signed)
 "  Post-Op Visit Note   Patient: Julia Potter           Date of Birth: August 19, 1990           MRN: [id number] Visit Date: 02/19/2024 PCP: Sebastian Beverley NOVAK, MD   Assessment & Plan:  Chief Complaint: No chief complaint on file.  Visit Diagnoses:  1. S/P ACL reconstruction     Plan: Patient is a 33 year old female who is s/p left knee anterior cruciate ligament reconstruction using bone patellar tendon bone autograft on 12/21/2023.  She is about 2 months out and feels like she is doing better.  Has some difficulty when she for starts walking but this is more pain related.  Has some limping at times but as she walks more, the limping resolves.  Knee never gives out on her.  The hypersensitivity and numbness/tingling in the saphenous nerve distribution symptoms have completely resolved.  She is in with physical therapy in the last several weeks and feels like this is going well.  She has achieved 111 degrees of knee flexion in PT.  On exam, patient has incisions that are well-healed.  There is some possible early keloid formation in the midline anterior incision that we will keep an eye on.  No effusion.  Able to perform straight leg raise without extensor lag.  Excellent quad strength rated 5/5.  She has 0 degrees extension and about 105 degrees of knee flexion.  ACL graft is stable on Lachman and by anterior drawer.  No discomfort with light touch in the saphenous nerve distribution.  Palpable DP pulse.  Negative Homans' sign.  No calf tenderness.  Plan at this time is continue with physical therapy exercises.  She is okay to start working out in the gym but I would only stick to the exercises that she is doing with PT and not expand any of these.  Avoid any running or jumping.  No cutting or pivoting.  Follow-up in 6 weeks for clinical recheck.  Follow-Up Instructions: No follow-ups on file.   Orders:  No orders of the defined types were placed in this encounter.  No orders of the defined  types were placed in this encounter.   Imaging: No results found.  PMFS History: Patient Active Problem List   Diagnosis Date Noted   IUD (intrauterine device) in place 07/24/2023   History of severe pre-eclampsia 10/12/2019   History of delayed postpartum hemorrhage 10/10/2019   History of preterm delivery due to severe preeclampsia 04/25/2019   Chronic hypertension affecting pregnancy 04/18/2019   Herpes    Hirsutism 09/14/2015   Psychiatric illness 11/01/2013   Past Medical History:  Diagnosis Date   Abnormal uterine bleeding (AUB) 01/20/2016   ALLERGIC RHINITIS    Anemia, postpartum 10/12/2019   Anxiety    no meds   Chlamydia    Chronic hypertension in pregnancy 09/29/2019   Chronic hypertension with superimposed severe preeclampsia 10/09/2019   Concern for right renal agnesis, pending confirmation @32wk  US  04/03/2023   Depression    doing ok, in therapy - no meds   Herpes    History of multiple miscarriages    History of PCOS 04/25/2019   History of postpartum hemorrhage, currently pregnant    HSV infection    Preeclampsia 2010   resolved after pregnancy   Rh negative state in antepartum period 05/10/2017   Seasonal allergies     Family History  Problem Relation Age of Onset   Hypertension Mother  Asthma Brother    Asthma Daughter    Diabetes Maternal Grandmother    Cancer Neg Hx    Heart disease Neg Hx     Past Surgical History:  Procedure Laterality Date   DILATION AND EVACUATION N/A 05/23/2017   Procedure: DILATATION AND EVACUATION;  Surgeon: Izell Harari, MD;  Location: [facility];  Service: Gynecology;  Laterality: N/A;  needs US    INTRAUTERINE DEVICE INSERTION  09/2010   IUD REMOVAL     Social History   Occupational History   Not on file  Tobacco Use   Smoking status: Never   Smokeless tobacco: Never  Vaping Use   Vaping status: Never Used  Substance and Sexual Activity   Alcohol use: Not Currently    Comment: socially   Drug use: No    Sexual activity: Yes    Birth control/protection: None     "

## 2024-02-20 ENCOUNTER — Ambulatory Visit: Admitting: Physical Therapy

## 2024-02-20 DIAGNOSIS — M25662 Stiffness of left knee, not elsewhere classified: Secondary | ICD-10-CM

## 2024-02-20 DIAGNOSIS — Z9889 Other specified postprocedural states: Secondary | ICD-10-CM

## 2024-02-20 DIAGNOSIS — M6281 Muscle weakness (generalized): Secondary | ICD-10-CM

## 2024-02-20 NOTE — Therapy (Signed)
 " OUTPATIENT PHYSICAL THERAPY LOWER EXTREMITY TREATMENT   Patient Name: Julia Potter MRN: [id number] DOB:Mar 27, 1990, 34 y.o., female Today's Date: 02/20/2024  END OF SESSION:  PT End of Session - 02/20/24 0933     Visit Number 7    Date for Recertification  04/24/24    Authorization Type [facility] [employer]    PT Start Time 0930    PT Stop Time 1015    PT Time Calculation (min) 45 min         Past Medical History:  Diagnosis Date   Abnormal uterine bleeding (AUB) 01/20/2016   ALLERGIC RHINITIS    Anemia, postpartum 10/12/2019   Anxiety    no meds   Chlamydia    Chronic hypertension in pregnancy 09/29/2019   Chronic hypertension with superimposed severe preeclampsia 10/09/2019   Concern for right renal agnesis, pending confirmation @32wk  US  04/03/2023   Depression    doing ok, in therapy - no meds   Herpes    History of multiple miscarriages    History of PCOS 04/25/2019   History of postpartum hemorrhage, currently pregnant    HSV infection    Preeclampsia 2010   resolved after pregnancy   Rh negative state in antepartum period 05/10/2017   Seasonal allergies    Past Surgical History:  Procedure Laterality Date   DILATION AND EVACUATION N/A 05/23/2017   Procedure: DILATATION AND EVACUATION;  Surgeon: Izell Harari, MD;  Location: [facility];  Service: Gynecology;  Laterality: N/A;  needs US    INTRAUTERINE DEVICE INSERTION  09/2010   IUD REMOVAL     Patient Active Problem List   Diagnosis Date Noted   IUD (intrauterine device) in place 07/24/2023   History of severe pre-eclampsia 10/12/2019   History of delayed postpartum hemorrhage 10/10/2019   History of preterm delivery due to severe preeclampsia 04/25/2019   Chronic hypertension affecting pregnancy 04/18/2019   Herpes    Hirsutism 09/14/2015   Psychiatric illness 11/01/2013    PCP: Sebastian Beverley NOVAK, MD  REFERRING PROVIDER: Addie Cordella Hamilton, MD  REFERRING DIAG: [phone] (ICD-10-CM) - Left ACL  tear  THERAPY DIAG:  S/P ACL surgery  Decreased range of motion (ROM) of left knee  Muscle weakness (generalized)  Rationale for Evaluation and Treatment: Rehabilitation  ONSET DATE: 12/25/23 (Procedure)  SUBJECTIVE:   SUBJECTIVE STATEMENT: saw MD and he was pleased, released me to the gym. Stood 6 hours outside yesterday to see Vila so a bit more sore. Did okay after last session.   Pt reported the L knee has felt weak so she has continued brace wear of a brace purchased online and still uses crutches for ambulation. Pt confirmed Surgery was on 12/25/23. Pt states she has been doing initial exercises given by doctor.   PERTINENT HISTORY: See PMH Chart above  PAIN:  Are you having pain 5/10 with pain meds PRECAUTIONS: None  RED FLAGS: None   WEIGHT BEARING RESTRICTIONS:   FALLS:  Has patient fallen in last 6 months? No  LIVING ENVIRONMENT: Lives with: lives with their family Lives in: House/apartment Stairs: No Has following equipment at home: Crutches  OCCUPATION: Work from home   PLOF: Independent  PATIENT GOALS: To be able to return to the gym and complete her normal regime.   NEXT MD VISIT: 4 weeks   OBJECTIVE:  Note: Objective measures were completed at Evaluation unless otherwise noted.  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  This may be a chronic complete tear. The posterior cruciate ligament, medial collateral ligament, and lateral collateral ligament are intact. The menisci are unremarkable. No significant chondromalacia. No significant joint effusion.   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 Correlate for instability. The exam is otherwise unremarkable.   Electronically signed by: Reyes Frees MD 11/21/2023 12:09 PM EDT RP  PATIENT SURVEYS:   LEFS: Lower Extremity  Functional Score: 14 / 80 = 17.5%  COGNITION: Overall cognitive status: Within functional limits for tasks assessed     SENSATION: WFL; high sensitivity at L shin   EDEMA:  Slight sweling at L knee joint    LOWER EXTREMITY ROM:  Active ROM Right eval Left eval  Hip flexion WNL WNL  Hip extension    Hip abduction    Hip adduction    Hip internal rotation    Hip external rotation    Knee flexion WNL 85d  Knee extension WNL WNL  Ankle dorsiflexion WNL WNL  Ankle plantarflexion    Ankle inversion    Ankle eversion     (Blank rows = not tested)  LOWER EXTREMITY MMT:  MMT Right eval Left eval  Hip flexion WNL   Hip extension    Hip abduction    Hip adduction    Hip internal rotation    Hip external rotation    Knee flexion WNL 2/10  Knee extension WNL 3/10  Ankle dorsiflexion WNL WNL  Ankle plantarflexion    Ankle inversion    Ankle eversion     (Blank rows = not tested)   FUNCTIONAL TESTS:  30 seconds chair stand test: 4 reps   Lower Extremity Functional Score: 14 / 80 = 17.5 %  GAIT: Distance walked: In Clinic  Assistive device utilized: Crutches Level of assistance: Modified independence Comments: Forward trunk lean; weight shift to the R, decreased step length , decreased L knee flexion in swing phase                                                                                                                          TREATMENT DATE:   02/20/24 Elliptical 3# min each way TM OFF LLE 20 x fwd and back Bike L 4 TM OFF LLE 20 x fwd and back BOSU step up 15 x fwd, 15 x laterally ( UE support, decreased lateral stability) 6 in heel tap 15 x with cuing Mini squats with resisted TKE 2 sets 10 ( blue tband) Monster walk 20 feet 2 x blue tband Fitter flex and ext LLE 2 sets 10 1 blue LLE HS curl 2 sets 10 3 # LAQ 2 sets 10    02/16/24 Left Knee AROM at Start of Session 0-111 MMT Left knee HS 4+, quad 4/5 Assessed Goals Elliptical 3 min each  fwd and back L 3 TM OFF LLE 20 x fwd and back Resisted gait 40#  4 ways 5 x each 6 in step up 15 x with UE support- cued to lift not hop 6 inch lateral step up with UE support 4 inch heel tap 10x with UE support, then increased to 6 in for 10 with hesitantion from pt Leg press 40lb 2x15, LLE 20lb TKE 2x10 with green  tband Fitter flex and ext LLE 2 sets 10 1 blue Bike L 4   02/14/24 L knee PROM w/ end range holds  Patellar mobs RLE Multi angle isometric for Ext LLE 5x3'' Sit to stands 2x10  Bike L 3 x 4 min 4in step ups x5 each  6in step ups x5 super set with Tmill pushes x30 for three sets Mini wall squats 4x10'' LLE SLS 5x10 Leg press 40lb 2x15, LLE 20lb TKE 2x10  02/08/24 TM .8 to 1.3 mph working to increase stride and increase flexion in swing phase Nustep L 5 LE only Resisted gait 20# 5 x fwd and back, laterally 3 x each Black bar heel raise and toe raise 2 sets 10 STS on airex 2 sets 10 without UE 4 in step up LLE 10x with UE then 6 in LAQ 2 sets 10 hold 3 sec Red tband HS curls 2 sets 10 seated Leg Press 20# BIL 2 sets 10 with cuing for equal wt on LE, LLE only unlocked for ROM 10 x AROM end of session 103 02/06/24 Worked on gait with HHA then without assist with cuing for left knee flexion in swing phase , step thru on RT and left heel strike- did well overall mostly struggle with heel strike Nustep L 5 LE only 6 in step tap left 10 x then RT 10 x with PTA support left knee 2 setsp Standing left HS curl 10 x Yellow tband seated HS curl 2 sets 10 TKE seated yellow tband 2 sets 10 Mini squats with resistance with TKE 2 sets 10 with cuing for equal wt STS 10x with cues for equal wt Step up 4 inch 10 x- cued more wt through knee vs using UE Step up 6 in 10x AROM 0-96, Passive stretches plus ROM 106   02/02/24 PROM knee flex; AROM knee ext NuStep L2 x 5 min  Ambulation w/o crutches 20' x2 HHA Prone HS curls 2x5 Supine HS isometrics 2x8 Toe Taps 4  step 1 railing use 2x10 1 crutch ambulation step to pattern   01/31/24- eval    PATIENT EDUCATION:  Education details: HEP Person educated: Patient Education method: Medical Illustrator Education comprehension: verbalized understanding and returned demonstration  HOME EXERCISE PROGRAM:  Access Code: [email or web address] Date: 01/31/2024 Prepared by: Almetta Fam Exercises - Seated Heel Slide  - 1 x daily - 7 x weekly - 2 sets - 10 reps - 3 hold - Supine Isometric Hamstring Set  - 1 x daily - 7 x weekly - 2 sets - 10 reps - 3 hold - Active Straight Leg Raise with Quad Set  - 1 x daily - 7 x weekly - 2 sets - 10 reps - Supine Quad Set  - 1 x daily - 7 x weekly - 2 sets - 10 reps - 3 hold - Side to Side Weight Shift with Counter Support  - 1 x daily - 7 x weekly - 2 sets - 10 reps   ASSESSMENT:  CLINICAL IMPRESSION: Progressed strength and func ROM with cuing to prevent compensations. Tolerated session well, visibly fatigued with muscle shaking, UE support for stability  as needed. Discussed return to gym and okayed all ex except knee ext machine   Patient is a 34 year old female who presents s/p left knee anterior cruciate ligament reconstruction bone patellar tendon bone autograft on 12/25/2023. Pt arrives ~6 weeks post op to PT today in a knee brace and using crutches; able to WBAT. Pt exhibits 0d of extension and deficits are in knee flexion ROM. Pt will benefit from PT to increase L knee ROM and strength to increase quality of gait to ease in participating in ADLs. Plan to progress pt to no AD use.   OBJECTIVE IMPAIRMENTS: Abnormal gait, decreased endurance, decreased ROM, decreased strength, and pain.   ACTIVITY LIMITATIONS: carrying, lifting, bending, and squatting  PARTICIPATION LIMITATIONS: cleaning and laundry  REHAB POTENTIAL: Good  CLINICAL DECISION MAKING: Stable/uncomplicated  EVALUATION COMPLEXITY: Low   GOALS: Goals reviewed with patient? Yes  SHORT TERM GOALS: Target  date: 03/13/24 Pt will be fully independent with initial HEP Baseline: Goal status: IN PROGRESS ~50% 02/02/24 MET 02/16/24   LONG TERM GOALS: Target date: 04/24/24  Pt will obtain active ROM WNL for knee flexion in LLE Baseline: 85d Goal status: IN PROGRESS 85d AROM 02/02/24 02/13/24 progressing 0-111  2.  Pt will be able to ambulate 523ft or more with normalized gait pattern and no AD   Baseline: antalgic with B crutches  Goal status: IN PROGRESS 20' at a time with [facility] 02/02/24 02/13/24 progressing  3.  Pt will perform >12 STS in 30 second timeframe  Baseline: 4 reps  Goal status: MET 02/16/24  4.  Pt will increase LEFS score by 20 points  Baseline: See above  Goal status: INITIAL  5.  Pt will return to regular gym activity without pain in order to return to recreational activity Baseline:  Goal status: IN PROGRESS 02/02/24 and 02/16/24  PLAN:  PT FREQUENCY: 2x/week  PT DURATION: 12 weeks  PLANNED INTERVENTIONS: 97110-Therapeutic exercises, 97530- Therapeutic activity, 97112- Neuromuscular re-education, 97535- Self Care, [id number]- Manual therapy, [phone]- Gait training, and Patient/Family education  PLAN FOR NEXT SESSION: progress strength ,func and gait  Kahmya Pinkham,ANGIE, PTA 02/20/2024, 10:07 AM  "

## 2024-02-22 ENCOUNTER — Ambulatory Visit: Admitting: Physical Therapy

## 2024-02-22 DIAGNOSIS — M6281 Muscle weakness (generalized): Secondary | ICD-10-CM

## 2024-02-22 DIAGNOSIS — Z9889 Other specified postprocedural states: Secondary | ICD-10-CM | POA: Diagnosis not present

## 2024-02-22 DIAGNOSIS — M25662 Stiffness of left knee, not elsewhere classified: Secondary | ICD-10-CM

## 2024-02-22 DIAGNOSIS — M25562 Pain in left knee: Secondary | ICD-10-CM

## 2024-02-22 NOTE — Telephone Encounter (Signed)
 Okay for note for 1/21

## 2024-02-22 NOTE — Therapy (Signed)
 " OUTPATIENT PHYSICAL THERAPY LOWER EXTREMITY TREATMENT   Patient Name: Julia Potter MRN: [id number] DOB:1991-01-27, 34 y.o., female Today's Date: 02/22/2024  END OF SESSION:  PT End of Session - 02/22/24 0927     Visit Number 8    Date for Recertification  04/24/24    Authorization Type [place] Medicaid    PT Start Time 0927    PT Stop Time 1015    PT Time Calculation (min) 48 min         Past Medical History:  Diagnosis Date   Abnormal uterine bleeding (AUB) 01/20/2016   ALLERGIC RHINITIS    Anemia, postpartum 10/12/2019   Anxiety    no meds   Chlamydia    Chronic hypertension in pregnancy 09/29/2019   Chronic hypertension with superimposed severe preeclampsia 10/09/2019   Concern for right renal agnesis, pending confirmation @32wk  US  04/03/2023   Depression    doing ok, in therapy - no meds   Herpes    History of multiple miscarriages    History of PCOS 04/25/2019   History of postpartum hemorrhage, currently pregnant    HSV infection    Preeclampsia 2010   resolved after pregnancy   Rh negative state in antepartum period 05/10/2017   Seasonal allergies    Past Surgical History:  Procedure Laterality Date   DILATION AND EVACUATION N/A 05/23/2017   Procedure: DILATATION AND EVACUATION;  Surgeon: Izell Harari, MD;  Location: [facility];  Service: Gynecology;  Laterality: N/A;  needs US    INTRAUTERINE DEVICE INSERTION  09/2010   IUD REMOVAL     Patient Active Problem List   Diagnosis Date Noted   IUD (intrauterine device) in place 07/24/2023   History of severe pre-eclampsia 10/12/2019   History of delayed postpartum hemorrhage 10/10/2019   History of preterm delivery due to severe preeclampsia 04/25/2019   Chronic hypertension affecting pregnancy 04/18/2019   Herpes    Hirsutism 09/14/2015   Psychiatric illness 11/01/2013    PCP: Sebastian Beverley NOVAK, MD  REFERRING PROVIDER: Addie Cordella Hamilton, MD  REFERRING DIAG: [phone] (ICD-10-CM) - Left ACL  tear  THERAPY DIAG:  S/P ACL surgery  Decreased range of motion (ROM) of left knee  Muscle weakness (generalized)  Acute pain of left knee  Rationale for Evaluation and Treatment: Rehabilitation  ONSET DATE: 12/25/23 (Procedure)  SUBJECTIVE:   SUBJECTIVE STATEMENT:  wow you really worked me out last session very sore. Amb in with limited knee flexion in sing phase- stated because she was sore ( did better as session progressed)  Pt reported the L knee has felt weak so she has continued brace wear of a brace purchased online and still uses crutches for ambulation. Pt confirmed Surgery was on 12/25/23. Pt states she has been doing initial exercises given by doctor.   PERTINENT HISTORY: See PMH Chart above  PAIN:  Are you having pain 5/10 with pain meds PRECAUTIONS: None  RED FLAGS: None   WEIGHT BEARING RESTRICTIONS:   FALLS:  Has patient fallen in last 6 months? No  LIVING ENVIRONMENT: Lives with: lives with their family Lives in: House/apartment Stairs: No Has following equipment at home: Crutches  OCCUPATION: Work from home   PLOF: Independent  PATIENT GOALS: To be able to return to the gym and complete her normal regime.   NEXT MD VISIT: 4 weeks   OBJECTIVE:  Note: Objective measures were completed at Evaluation unless otherwise noted.  DIAGNOSTIC FINDINGS: FINDINGS: There is a chronic at least partial tear of  the anterior cruciate ligament with attenuated fibers. This may be a chronic complete tear. The posterior cruciate ligament, medial collateral ligament, and lateral collateral ligament are intact. The menisci are unremarkable. No significant chondromalacia. No significant joint effusion.   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 Correlate for instability. The exam is otherwise unremarkable.   Electronically signed by: Reyes Frees MD 11/21/2023 12:09 PM EDT  RP  PATIENT SURVEYS:   LEFS: Lower Extremity Functional Score: 14 / 80 = 17.5%  COGNITION: Overall cognitive status: Within functional limits for tasks assessed     SENSATION: WFL; high sensitivity at L shin   EDEMA:  Slight sweling at L knee joint    LOWER EXTREMITY ROM:  Active ROM Right eval Left eval  Hip flexion WNL WNL  Hip extension    Hip abduction    Hip adduction    Hip internal rotation    Hip external rotation    Knee flexion WNL 85d  Knee extension WNL WNL  Ankle dorsiflexion WNL WNL  Ankle plantarflexion    Ankle inversion    Ankle eversion     (Blank rows = not tested)  LOWER EXTREMITY MMT:  MMT Right eval Left eval  Hip flexion WNL   Hip extension    Hip abduction    Hip adduction    Hip internal rotation    Hip external rotation    Knee flexion WNL 2/10  Knee extension WNL 3/10  Ankle dorsiflexion WNL WNL  Ankle plantarflexion    Ankle inversion    Ankle eversion     (Blank rows = not tested)   FUNCTIONAL TESTS:  30 seconds chair stand test: 4 reps   Lower Extremity Functional Score: 14 / 80 = 17.5 %  GAIT: Distance walked: In Clinic  Assistive device utilized: Crutches Level of assistance: Modified independence Comments: Forward trunk lean; weight shift to the R, decreased step length , decreased L knee flexion in swing phase                                                                                                                          TREATMENT DATE:   02/22/24 Nustep L 5 LE only Bike L 4 Squat with butt touches 2 sets 10, cued for =wt distribution LLE BOSU step up fwd and laterally 10 x each See HEP issued below Passive stretching with ROM AROM sitting 0-114    02/20/24 Elliptical 3# min each way TM OFF LLE 20 x fwd and back Bike L 4 TM OFF LLE 20 x fwd and back BOSU step up 15 x fwd, 15 x laterally ( UE support, decreased lateral stability) 6 in heel tap 15 x with cuing Mini squats with  resisted TKE 2 sets 10 ( blue tband) Monster walk 20 feet 2 x blue tband Fitter flex and ext LLE 2 sets 10 1 blue  LLE HS curl 2 sets 10 3 # LAQ 2 sets 10    02/16/24 Left Knee AROM at Start of Session 0-111 MMT Left knee HS 4+, quad 4/5 Assessed Goals Elliptical 3 min each fwd and back L 3 TM OFF LLE 20 x fwd and back Resisted gait 40# 4 ways 5 x each 6 in step up 15 x with UE support- cued to lift not hop 6 inch lateral step up with UE support 4 inch heel tap 10x with UE support, then increased to 6 in for 10 with hesitantion from pt Leg press 40lb 2x15, LLE 20lb TKE 2x10 with green  tband Fitter flex and ext LLE 2 sets 10 1 blue Bike L 4   02/14/24 L knee PROM w/ end range holds  Patellar mobs RLE Multi angle isometric for Ext LLE 5x3'' Sit to stands 2x10  Bike L 3 x 4 min 4in step ups x5 each  6in step ups x5 super set with Tmill pushes x30 for three sets Mini wall squats 4x10'' LLE SLS 5x10 Leg press 40lb 2x15, LLE 20lb TKE 2x10  02/08/24 TM .8 to 1.3 mph working to increase stride and increase flexion in swing phase Nustep L 5 LE only Resisted gait 20# 5 x fwd and back, laterally 3 x each Black bar heel raise and toe raise 2 sets 10 STS on airex 2 sets 10 without UE 4 in step up LLE 10x with UE then 6 in LAQ 2 sets 10 hold 3 sec Red tband HS curls 2 sets 10 seated Leg Press 20# BIL 2 sets 10 with cuing for equal wt on LE, LLE only unlocked for ROM 10 x AROM end of session 103 02/06/24 Worked on gait with HHA then without assist with cuing for left knee flexion in swing phase , step thru on RT and left heel strike- did well overall mostly struggle with heel strike Nustep L 5 LE only 6 in step tap left 10 x then RT 10 x with PTA support left knee 2 setsp Standing left HS curl 10 x Yellow tband seated HS curl 2 sets 10 TKE seated yellow tband 2 sets 10 Mini squats with resistance with TKE 2 sets 10 with cuing for equal wt STS 10x with cues for  equal wt Step up 4 inch 10 x- cued more wt through knee vs using UE Step up 6 in 10x AROM 0-96, Passive stretches plus ROM 106   02/02/24 PROM knee flex; AROM knee ext NuStep L2 x 5 min  Ambulation w/o crutches 20' x2 HHA Prone HS curls 2x5 Supine HS isometrics 2x8 Toe Taps 4 step 1 railing use 2x10 1 crutch ambulation step to pattern   01/31/24- eval    PATIENT EDUCATION:  Education details: HEP Person educated: Patient Education method: Medical Illustrator Education comprehension: verbalized understanding and returned demonstration  HOME EXERCISE PROGRAM: Access Code: [email or web address] URL: [email or web address] Date: 02/22/2024 Prepared by: Jon Bernette Seeman  Exercises - Mini Squat with Chair  - 1 x daily - 7 x weekly - 2 sets - 10 reps - Wall Quarter Squat  - 1 x daily - 7 x weekly - 1 sets - 10 reps - 10 hold - Side Stepping with Resistance at Ankles  - 1 x daily - 7 x weekly - 2 sets - 10 reps - Seated Long Arc Quad  - 1 x daily - 7 x weekly - 2 sets - 10 reps - 3 hold -  Forward Step Up  - 1 x daily - 7 x weekly - 2 sets - 10 reps - Lateral Step Ups  - 1 x daily - 7 x weekly - 2 sets - 10 reps - Supine Straight-Leg Raise With Resistance at Ankles  - 1 x daily - 7 x weekly - 2 sets - 10 reps - Supine Bridge  - 1 x daily - 7 x weekly - 2 sets - 10 reps - 3 hold  Access Code: [email or web address] Date: 01/31/2024 Prepared by: Almetta Fam Exercises - Seated Heel Slide  - 1 x daily - 7 x weekly - 2 sets - 10 reps - 3 hold - Supine Isometric Hamstring Set  - 1 x daily - 7 x weekly - 2 sets - 10 reps - 3 hold - Active Straight Leg Raise with Quad Set  - 1 x daily - 7 x weekly - 2 sets - 10 reps - Supine Quad Set  - 1 x daily - 7 x weekly - 2 sets - 10 reps - 3 hold - Side to Side Weight Shift with Counter Support  - 1 x daily - 7 x weekly - 2 sets - 10 reps   ASSESSMENT:  CLINICAL IMPRESSION: Progressed strength and func ROM with cuing to prevent compensations. Tolerated  session well, visibly fatigued with muscle shaking, updated and advanced HEP  Patient is a 34 year old female who presents s/p left knee anterior cruciate ligament reconstruction bone patellar tendon bone autograft on 12/25/2023. Pt arrives ~6 weeks post op to PT today in a knee brace and using crutches; able to WBAT. Pt exhibits 0d of extension and deficits are in knee flexion ROM. Pt will benefit from PT to increase L knee ROM and strength to increase quality of gait to ease in participating in ADLs. Plan to progress pt to no AD use.   OBJECTIVE IMPAIRMENTS: Abnormal gait, decreased endurance, decreased ROM, decreased strength, and pain.   ACTIVITY LIMITATIONS: carrying, lifting, bending, and squatting  PARTICIPATION LIMITATIONS: cleaning and laundry  REHAB POTENTIAL: Good  CLINICAL DECISION MAKING: Stable/uncomplicated  EVALUATION COMPLEXITY: Low   GOALS: Goals reviewed with patient? Yes  SHORT TERM GOALS: Target date: 03/13/24 Pt will be fully independent with initial HEP Baseline: Goal status: IN PROGRESS ~50% 02/02/24 MET 02/16/24   LONG TERM GOALS: Target date: 04/24/24  Pt will obtain active ROM WNL for knee flexion in LLE Baseline: 85d Goal status: IN PROGRESS 85d AROM 02/02/24 02/13/24 progressing 0-111. Progressing 02/22/24  2.  Pt will be able to ambulate 532ft or more with normalized gait pattern and no AD   Baseline: antalgic with B crutches  Goal status: IN PROGRESS 20' at a time with [facility] 02/02/24 02/13/24 progressing  3.  Pt will perform >12 STS in 30 second timeframe  Baseline: 4 reps  Goal status: MET 02/16/24  4.  Pt will increase LEFS score by 20 points  Baseline: See above  Goal status: INITIAL  5.  Pt will return to regular gym activity without pain in order to return to recreational activity Baseline:  Goal status: IN PROGRESS 02/02/24 and 02/16/24  PLAN:  PT FREQUENCY: 2x/week  PT DURATION: 12 weeks  PLANNED INTERVENTIONS: 97110-Therapeutic exercises,  97530- Therapeutic activity, 97112- Neuromuscular re-education, 97535- Self Care, 02859- Manual therapy, [phone]- Gait training, and Patient/Family education  PLAN FOR NEXT SESSION: progress strength ,func and gait  Taylinn Brabant,ANGIE, PTA 02/22/2024, 9:27 AM  "

## 2024-02-27 ENCOUNTER — Ambulatory Visit: Admitting: Physical Therapy

## 2024-02-29 ENCOUNTER — Ambulatory Visit
